# Patient Record
Sex: Female | Born: 1980
Health system: Southern US, Community
[De-identification: ages and names within clinical notes are randomized; demographics above are authoritative.]

## PROBLEM LIST (undated history)

## (undated) DIAGNOSIS — Z8709 Personal history of other diseases of the respiratory system: Secondary | ICD-10-CM

## (undated) DIAGNOSIS — F1910 Other psychoactive substance abuse, uncomplicated: Secondary | ICD-10-CM

## (undated) DIAGNOSIS — G4700 Insomnia, unspecified: Secondary | ICD-10-CM

## (undated) DIAGNOSIS — F419 Anxiety disorder, unspecified: Secondary | ICD-10-CM

## (undated) DIAGNOSIS — R51 Headache: Secondary | ICD-10-CM

## (undated) DIAGNOSIS — F329 Major depressive disorder, single episode, unspecified: Secondary | ICD-10-CM

## (undated) DIAGNOSIS — Z765 Malingerer [conscious simulation]: Secondary | ICD-10-CM

## (undated) DIAGNOSIS — Z87442 Personal history of urinary calculi: Secondary | ICD-10-CM

## (undated) DIAGNOSIS — M549 Dorsalgia, unspecified: Secondary | ICD-10-CM

## (undated) DIAGNOSIS — R102 Pelvic and perineal pain: Secondary | ICD-10-CM

## (undated) DIAGNOSIS — R351 Nocturia: Secondary | ICD-10-CM

## (undated) DIAGNOSIS — F32A Depression, unspecified: Secondary | ICD-10-CM

## (undated) DIAGNOSIS — R519 Headache, unspecified: Secondary | ICD-10-CM

## (undated) DIAGNOSIS — G8929 Other chronic pain: Secondary | ICD-10-CM

## (undated) DIAGNOSIS — J302 Other seasonal allergic rhinitis: Secondary | ICD-10-CM

## (undated) HISTORY — PX: TUBAL LIGATION: SHX77

## (undated) HISTORY — PX: CHOLECYSTECTOMY: SHX55

---
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
## 2000-01-27 ENCOUNTER — Inpatient Hospital Stay (HOSPITAL_COMMUNITY): Admission: AD | Admit: 2000-01-27 | Discharge: 2000-01-27 | Payer: Self-pay | Admitting: Obstetrics

## 2000-01-27 ENCOUNTER — Encounter: Payer: Self-pay | Admitting: Obstetrics

## 2000-02-24 ENCOUNTER — Other Ambulatory Visit: Admission: RE | Admit: 2000-02-24 | Discharge: 2000-02-24 | Payer: Self-pay | Admitting: Obstetrics and Gynecology

## 2000-07-15 ENCOUNTER — Ambulatory Visit (HOSPITAL_COMMUNITY): Admission: AD | Admit: 2000-07-15 | Discharge: 2000-07-15 | Payer: Self-pay | Admitting: Obstetrics and Gynecology

## 2000-07-26 ENCOUNTER — Observation Stay (HOSPITAL_COMMUNITY): Admission: AD | Admit: 2000-07-26 | Discharge: 2000-07-27 | Payer: Self-pay | Admitting: Obstetrics and Gynecology

## 2000-07-26 ENCOUNTER — Ambulatory Visit (HOSPITAL_COMMUNITY): Admission: RE | Admit: 2000-07-26 | Discharge: 2000-07-26 | Payer: Self-pay | Admitting: Obstetrics and Gynecology

## 2000-07-27 ENCOUNTER — Encounter: Payer: Self-pay | Admitting: Obstetrics and Gynecology

## 2000-09-13 ENCOUNTER — Inpatient Hospital Stay (HOSPITAL_COMMUNITY): Admission: AD | Admit: 2000-09-13 | Discharge: 2000-09-15 | Payer: Self-pay | Admitting: Obstetrics and Gynecology

## 2001-03-28 ENCOUNTER — Other Ambulatory Visit: Admission: RE | Admit: 2001-03-28 | Discharge: 2001-03-28 | Payer: Self-pay | Admitting: Obstetrics and Gynecology

## 2002-04-02 ENCOUNTER — Ambulatory Visit (HOSPITAL_COMMUNITY): Admission: RE | Admit: 2002-04-02 | Discharge: 2002-04-02 | Payer: Self-pay | Admitting: Obstetrics and Gynecology

## 2002-04-02 ENCOUNTER — Encounter: Payer: Self-pay | Admitting: Obstetrics and Gynecology

## 2002-05-23 ENCOUNTER — Encounter: Payer: Self-pay | Admitting: Obstetrics & Gynecology

## 2002-05-23 ENCOUNTER — Ambulatory Visit (HOSPITAL_COMMUNITY): Admission: RE | Admit: 2002-05-23 | Discharge: 2002-05-23 | Payer: Self-pay | Admitting: Obstetrics & Gynecology

## 2002-07-01 ENCOUNTER — Emergency Department (HOSPITAL_COMMUNITY): Admission: EM | Admit: 2002-07-01 | Discharge: 2002-07-01 | Payer: Self-pay | Admitting: Emergency Medicine

## 2002-07-01 ENCOUNTER — Encounter: Payer: Self-pay | Admitting: Emergency Medicine

## 2004-05-15 ENCOUNTER — Inpatient Hospital Stay (HOSPITAL_COMMUNITY): Admission: AD | Admit: 2004-05-15 | Discharge: 2004-05-15 | Payer: Self-pay | Admitting: Obstetrics and Gynecology

## 2004-05-30 ENCOUNTER — Emergency Department (HOSPITAL_COMMUNITY): Admission: EM | Admit: 2004-05-30 | Discharge: 2004-05-30 | Payer: Self-pay | Admitting: Emergency Medicine

## 2004-08-17 ENCOUNTER — Ambulatory Visit (HOSPITAL_COMMUNITY): Admission: RE | Admit: 2004-08-17 | Discharge: 2004-08-17 | Payer: Self-pay | Admitting: *Deleted

## 2004-10-12 ENCOUNTER — Ambulatory Visit (HOSPITAL_COMMUNITY): Admission: RE | Admit: 2004-10-12 | Discharge: 2004-10-12 | Payer: Self-pay | Admitting: *Deleted

## 2004-10-15 ENCOUNTER — Ambulatory Visit (HOSPITAL_COMMUNITY): Admission: AD | Admit: 2004-10-15 | Discharge: 2004-10-15 | Payer: Self-pay | Admitting: *Deleted

## 2004-10-15 ENCOUNTER — Emergency Department (HOSPITAL_COMMUNITY): Admission: EM | Admit: 2004-10-15 | Discharge: 2004-10-15 | Payer: Self-pay | Admitting: *Deleted

## 2004-10-22 ENCOUNTER — Ambulatory Visit (HOSPITAL_COMMUNITY): Admission: AD | Admit: 2004-10-22 | Discharge: 2004-10-22 | Payer: Self-pay | Admitting: *Deleted

## 2004-12-13 ENCOUNTER — Ambulatory Visit (HOSPITAL_COMMUNITY): Admission: AD | Admit: 2004-12-13 | Discharge: 2004-12-13 | Payer: Self-pay | Admitting: *Deleted

## 2004-12-15 ENCOUNTER — Ambulatory Visit (HOSPITAL_COMMUNITY): Admission: RE | Admit: 2004-12-15 | Discharge: 2004-12-15 | Payer: Self-pay | Admitting: *Deleted

## 2004-12-23 ENCOUNTER — Inpatient Hospital Stay (HOSPITAL_COMMUNITY): Admission: AD | Admit: 2004-12-23 | Discharge: 2004-12-26 | Payer: Self-pay | Admitting: *Deleted

## 2005-10-30 ENCOUNTER — Emergency Department (HOSPITAL_COMMUNITY): Admission: EM | Admit: 2005-10-30 | Discharge: 2005-10-30 | Payer: Self-pay | Admitting: Emergency Medicine

## 2006-02-27 ENCOUNTER — Emergency Department (HOSPITAL_COMMUNITY): Admission: EM | Admit: 2006-02-27 | Discharge: 2006-02-27 | Payer: Self-pay | Admitting: Emergency Medicine

## 2006-02-28 ENCOUNTER — Ambulatory Visit (HOSPITAL_COMMUNITY): Admission: RE | Admit: 2006-02-28 | Discharge: 2006-02-28 | Payer: Self-pay | Admitting: Emergency Medicine

## 2006-03-07 ENCOUNTER — Encounter (INDEPENDENT_AMBULATORY_CARE_PROVIDER_SITE_OTHER): Payer: Self-pay | Admitting: Specialist

## 2006-03-07 ENCOUNTER — Ambulatory Visit (HOSPITAL_COMMUNITY): Admission: RE | Admit: 2006-03-07 | Discharge: 2006-03-07 | Payer: Self-pay | Admitting: General Surgery

## 2006-09-25 ENCOUNTER — Emergency Department (HOSPITAL_COMMUNITY): Admission: EM | Admit: 2006-09-25 | Discharge: 2006-09-25 | Payer: Self-pay | Admitting: Emergency Medicine
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
## 2010-05-22 NOTE — Op Note (Signed)
[name], [name]              ACCOUNT NO.:  [id number]   MEDICAL RECORD NO.:  [id number]          PATIENT TYPE:  AMB   LOCATION:  DAY                           FACILITY:  [facility]   PHYSICIAN:  Dalia Heading, M.D.  DATE OF BIRTH:  01-14-1980   DATE OF PROCEDURE:  03/06/2006  DATE OF DISCHARGE:                               OPERATIVE REPORT   PREOPERATIVE DIAGNOSIS:  Cholecystitis, cholelithiasis.   POSTOPERATIVE DIAGNOSIS:  Cholecystitis, cholelithiasis.   PROCEDURE:  Laparoscopic cholecystectomy.   SURGEON:  Dalia Heading, MD   ANESTHESIA:  General endotracheal.   INDICATIONS:  The patient is a 31 year old white female who is referred  for evaluation and treatment of cholecystitis secondary to  cholelithiasis. The risks and benefits of the procedure including  bleeding, infection, hepatobiliary injury, and the possibility of an  open procedure were fully explained to the patient, who gave informed  consent.   PROCEDURE NOTE:  The patient was placed in the supine position.  After  induction of general endotracheal anesthesia, the abdomen was prepped  and draped using the usual sterile technique with Betadine.  Surgical  site confirmation was performed.   An infraumbilical incision was made down to the fascia.  Veress needle  was introduced into the abdominal cavity and confirmation of placement  was done using the saline drop test.  The abdomen was then insufflated  to 16 mmHg pressure.  An 11-mm trocar was introduced into the abdominal  cavity under direct visualization without difficulty.  The patient was  placed in reversed Trendelenburg position.  An additional 11-mm trocar  was placed epigastric region and 5-mm trocars were placed in the right  upper quadrant and right flank regions.  The liver was inspected and  noted to within normal limits.  The gallbladder was retracted superiorly  and laterally.  Hydrops of the gallbladder was noted.  This was  decompressed  using a needle.  The infundibulum was fully identified.  It  juncture to the infundibulum fully identified.  Endoclips were placed  proximally and distally on the cystic duct and the cystic duct was  divided.  This was likewise done to the cystic artery.  The gallbladder  then freed away from the gallbladder fossa using Bovie electrocautery.  The gallbladder was delivered through the epigastric trocar site using  EndoCatch bag.  The gallbladder fossa was inspected and no abnormal  bleeding or bile leakage was noted.  Surgicel was placed in the  gallbladder fossa.  All fluid and air were then evacuated from the  abdominal cavity prior to the removal of the trocars.   All wounds were irrigated with normal saline.  All wounds were checked  with 0.5 % Sensorcaine.  The infraumbilical fascia as well as epigastric  fascia were reapproximated using 0 Vicryl interrupted sutures.  All skin  incisions were closed using staples.  Betadine ointment and dry sterile  dressings were applied.   All tape and needle counts were correct at the end of the procedure.  The patient was extubated in the operating room went back to the  recovery room awake,  in stable condition.   COMPLICATIONS:  None.   SPECIMEN:  Gallbladder.   BLOOD LOSS:  Minimal.      Dalia Heading, M.D.  Electronically Signed     MAJ/MEDQ  D:  03/07/2006  T:  03/07/2006  Job:  [id number]

## 2010-05-22 NOTE — Discharge Summary (Signed)
NAME:  [name], [name]             ACCOUNT NO.:  [id number]   MEDICAL RECORD NO.:  [id number]          PATIENT TYPE:  INP   LOCATION:  A412                          FACILITY:  [facility]   PHYSICIAN:  Langley Gauss, MD     DATE OF BIRTH:  1980/08/14   DATE OF ADMISSION:  12/23/2004  DATE OF DISCHARGE:  12/23/2006LH                                 DISCHARGE SUMMARY   DISCHARGE DIAGNOSES:  A 37-week intrauterine pregnancy, in labor.   PROCEDURES:  1.  On December 24, 2004, epidural.  2.  On December 24, 2004, vaginal delivery.  3.  On December 26, 2004, discharged to home.   PERTINENT LABORATORY DATA:  GBS carrier status positive.  The patient was  treated during the course of labor with IV ampicillin.  In addition,  infant's circumcision performed December 26, 2004.  RPR negative, B positive  blood type.  Admission hemoglobin and hematocrit 12.3/35.7, white count  12.9.  Postpartum day # 1 11.7/33.8 with white count of 13.2.   DISCHARGE MEDICATIONS:  Tylox.   HOSPITAL COURSE:  See previous dictations.  The patient, actually, was  admitted in early stages of active preterm labor on December 23, 2004, at 43-  1/[redacted] weeks gestation.  After ambulating, she was noted to be 5 cm dilatation.  Amniotomy was thus performed on December 24, 2004.  Shortly thereafter,  epidural was placed.  She progressed very rapidly to well controlled  atraumatic vaginal delivery.  Postpartum, the patient has had no postpartum  complications.  Bonding well with the infant.  She is, however, interested  in permanent sterilization.  Thus, she is advised to follow up in the office  in three weeks time at which time this can be scheduled 5-6 weeks  postpartum.      Langley Gauss, MD  Electronically Signed     DC/MEDQ  D:  12/26/2004  T:  12/26/2004  Job:  [id number]
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
## 2010-05-22 NOTE — Op Note (Signed)
NAME:  [name], [name]             ACCOUNT NO.:  [id number]   MEDICAL RECORD NO.:  [id number]          PATIENT TYPE:  INP   LOCATION:  A412                          FACILITY:  [facility]   PHYSICIAN:  Langley Gauss, MD     DATE OF BIRTH:  1980-07-04   DATE OF PROCEDURE:  12/24/2004  DATE OF DISCHARGE:                                 OPERATIVE REPORT   PROCEDURE:  Placement of epidural L3-L4 interspace. Start time 0500.  Appropriate informed consent is obtained. The patient is noted be 8 cm  dilated, 0 station, completely effaced. She has received IV narcotics which  had been ineffectual. Thus, she is placed in a seated position. Bony  landmarks were identified. L3-L4 interspace was chosen. The patient's back  is sterilely prepped and draped. Utilizing an epidural tray, 5 cc 1%  lidocaine plain injected at the midline in the interspace. A 17-gauge Tuohy  Schiff needle was utilized with loss resistance air-filled glass syringe to  atraumatically identify entry into the epidural space on the first attempt  without difficulty. Very easy entry of the epidural space was performed  without any difficulty. Thus, test dosages of 5, 5 cc 2% lidocaine injected  through the epidural needle. No signs of CSF or intravascular injection  obtained. Epidural catheter was inserted to a depth of 5 cm. Epidural needle  was removed. Epidural catheter secured into place. The patient is now  connected to the infusion pump containing a standard mixture and will be  treated with a continuous infusion rate of 14 cc/hour. The patient per  nursing staff is noted to be 8 cm dilated.      Langley Gauss, MD  Electronically Signed     DC/MEDQ  D:  12/24/2004  T:  12/25/2004  Job:  [id number]

## 2010-05-22 NOTE — Op Note (Signed)
NAME:  [name], [name]             ACCOUNT NO.:  [id number]   MEDICAL RECORD NO.:  [id number]          PATIENT TYPE:  INP   LOCATION:                                FACILITY:  [facility]   PHYSICIAN:  Langley Gauss, MD     DATE OF BIRTH:  08-30-1980   DATE OF PROCEDURE:  12/24/2004  DATE OF DISCHARGE:  12/26/2004                                 OPERATIVE REPORT   PROCEDURES:  Delivery performed is spontaneous assisted vaginal delivery of  a viable vigorous female infant delivered over an intact peroneum.  Delivery  performed by Dr. Langley Gauss.  Estimated blood loss less then 500 cc.   COMPLICATIONS:  None.   SPECIMENS:  Cord gas and cord blood to laboratory.  Placenta was examined  and noted to be apparently intact with a three vessel umbilical cord.   ANALGESIA:  The patient had received IV Nubain and Phenergan during the  course of the labor, but also had epidural placed.   SUMMARY:  After amniotomy and findings of clear amniotic fluid, the patient  progressed very rapidly along the labor curve to complete dilatation after  which time I was notified on an urgent basis.  I was here on the floor.  The  patient was placed in the low lithotomy position, pushed well during short  second stages of labor.  Excellent peroneal support was provided.  Infant  did deliver in an direct OA position over an intact peroneum.  Also, bulb  section to clear amniotic fluids, spontaneous rotation occurred to a left  anterior shoulder position.  Gentle abdominal traction combined with  expulsive efforts resulted in delivery of the shoulder, as well as, the  remainder of the infant without difficulty.  Umbilical cord is __________and  cord is double clamped and cut.  Infant was placed on maternal abdomen for  many bonding purposes.  Cord gas and cord blood are obtained.  Gentle  traction on the umbilical cord resulted in separation at which upon exam  appears to be an intact placenta with three vessel  cord.  Excellent uterine  tone is achieved.  Examination of genital tract reveals no lacerations.  Peroneum is intact.  Thus, the patient is taken out of lithotomy position  and rolled to her side at which time the epidural catheter is removed with  the blue tip noted to be intact.      Langley Gauss, MD  Electronically Signed     DC/MEDQ  D:  12/26/2004  T:  12/26/2004  Job:  [id number]
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
## 2010-05-22 NOTE — H&P (Signed)
NAME:  [name], [name]             ACCOUNT NO.:  [id number]   MEDICAL RECORD NO.:  [id number]          PATIENT TYPE:  INP   LOCATION:  A412                          FACILITY:  [facility]   PHYSICIAN:  Langley Gauss, MD     DATE OF BIRTH:  1980/11/16   DATE OF ADMISSION:  12/23/2004  DATE OF DISCHARGE:  LH                                HISTORY & PHYSICAL   I  dictated H&P 1 week previously at which time the patient was felt to be  in active preterm labor. She was discharged home at that time undelivered.  This dictation will not be redone, rather change in the patient's vital  status as now she is 1 week further along. She is seen in the office at  which time she was noted to be 4 cm dilated, 80% effaced, and zero station.  She is referred to [facility] at which time I knew that she was  going to progress into labor. However, she was having uterine irritability  and occasional uterine contractions. She is noted to be a multiparous  patient with history of rapid labor and delivery. Thus, it was mandatory to  satisfy nursing staff that the patient be observed for an extended period of  time until contractions became every 5-7 minutes and cervical change has  occurred to 5 cm, 5 cm dilated, 80% effaced, and in zero station. She was  admitted. Amniotomy performed.  Clear amniotic fluid was noted. The fetal  scalp electrode was placed which documents reassuring fetal heart rate. She  is known to be positive GBS carrier status; thus, she is receiving IV  ampicillin during the course of labor.      Langley Gauss, MD  Electronically Signed     DC/MEDQ  D:  12/24/2004  T:  12/24/2004  Job:  [id number]

## 2010-05-22 NOTE — H&P (Signed)
NAMEMAYSUN, MEDITZ              ACCOUNT NO.:  [id number]   MEDICAL RECORD NO.:  [id number]          PATIENT TYPE:  AMB   LOCATION:  DAY                           FACILITY:  [facility]   PHYSICIAN:  Dalia Heading, M.D.  DATE OF BIRTH:  1980-05-27   DATE OF ADMISSION:  DATE OF DISCHARGE:  LH                              HISTORY & PHYSICAL   CHIEF COMPLAINT:  Cholecystitis, cholelithiasis.   HISTORY OF PRESENT ILLNESS:  The patient is a 30 year old white female  who is referred for evaluation and treatment of biliary colic secondary  to cholelithiasis.  She has been having right upper quadrant abdominal  pain, nausea, and bloating for many months.  She does have fatty food  intolerance.  No fever, chills, jaundice have been noted.   PAST MEDICAL HISTORY:  Unremarkable.   PAST SURGICAL HISTORY:  Unremarkable.   CURRENT MEDICATIONS:  Vicodin for pain.   ALLERGIES:  NO KNOWN DRUG ALLERGIES.   REVIEW OF SYSTEMS:  The patient smokes less than half a pack of  cigarettes a day.  She denies any significant alcohol use.   PHYSICAL EXAMINATION:  The patient is a well-developed, well-nourished  white female in no acute distress.  HEENT:  Reveals no scleral icterus.  LUNGS:  Clear to auscultation with equal breath sounds bilaterally.  HEART:  Reveals regular rate and rhythm without S3, S4, murmurs.  ABDOMEN:  Soft and nondistended.  She is tender in the right upper  quadrant to palpation.  No hepatosplenomegaly, masses, hernias are  identified.  Ultrasound of the gallbladder reveals cholelithiasis with a  normal common bile duct.   IMPRESSION:  Cholecystitis, cholelithiasis.   PLAN:  The patient is scheduled for laparoscopic cholecystectomy on  03/07/2006.  The risks and benefits of the procedure including bleeding,  infection, hepatobiliary injury, and the possibility of an open  procedure were fully explained to the patient, gave informed consent.      Dalia Heading, M.D.  Electronically Signed     MAJ/MEDQ  D:  03/01/2006  T:  03/01/2006  Job:  [id number]   cc:   Dalia Heading, M.D.  Fax: [phone]

## 2011-01-19 ENCOUNTER — Encounter (HOSPITAL_COMMUNITY): Payer: Self-pay | Admitting: *Deleted

## 2011-01-19 ENCOUNTER — Emergency Department (HOSPITAL_COMMUNITY)
Admission: EM | Admit: 2011-01-19 | Discharge: 2011-01-20 | Disposition: A | Discharge status: Home / Self Care | Payer: Self-pay | Attending: Emergency Medicine | Admitting: Emergency Medicine

## 2011-01-19 DIAGNOSIS — Z9889 Other specified postprocedural states: Secondary | ICD-10-CM | POA: Insufficient documentation

## 2011-01-19 DIAGNOSIS — Z9851 Tubal ligation status: Secondary | ICD-10-CM | POA: Insufficient documentation

## 2011-01-19 DIAGNOSIS — R1032 Left lower quadrant pain: Secondary | ICD-10-CM | POA: Insufficient documentation

## 2011-01-19 DIAGNOSIS — F17200 Nicotine dependence, unspecified, uncomplicated: Secondary | ICD-10-CM | POA: Insufficient documentation

## 2011-01-19 DIAGNOSIS — N949 Unspecified condition associated with female genital organs and menstrual cycle: Secondary | ICD-10-CM | POA: Insufficient documentation

## 2011-01-19 DIAGNOSIS — R102 Pelvic and perineal pain: Secondary | ICD-10-CM

## 2011-01-19 DIAGNOSIS — R52 Pain, unspecified: Secondary | ICD-10-CM | POA: Insufficient documentation

## 2011-01-19 LAB — POCT PREGNANCY, URINE: Preg Test, Ur: NEGATIVE

## 2011-01-19 NOTE — ED Notes (Signed)
C/o abd pain to lower from left side to right for 3 days, + nausea, denies vomiting or diarrhea, denies vag discharge, LBM today and normal per pt.

## 2011-01-19 NOTE — ED Notes (Signed)
Pt reporting pain in lower left portion of abdomen, radiating across to lower right.  Reports some nausea, denies vomiting. Pt reports history of ovarian cysts and reports pain is similar.

## 2011-01-20 ENCOUNTER — Other Ambulatory Visit (HOSPITAL_COMMUNITY): Payer: Self-pay | Admitting: Emergency Medicine

## 2011-01-20 ENCOUNTER — Ambulatory Visit (HOSPITAL_COMMUNITY)
Admit: 2011-01-20 | Discharge: 2011-01-20 | Disposition: A | Discharge status: Home / Self Care | Payer: Self-pay | Source: Ambulatory Visit | Attending: Emergency Medicine | Admitting: Emergency Medicine

## 2011-01-20 DIAGNOSIS — R1032 Left lower quadrant pain: Secondary | ICD-10-CM | POA: Insufficient documentation

## 2011-01-20 DIAGNOSIS — R52 Pain, unspecified: Secondary | ICD-10-CM

## 2011-01-20 DIAGNOSIS — N949 Unspecified condition associated with female genital organs and menstrual cycle: Secondary | ICD-10-CM | POA: Insufficient documentation

## 2011-01-20 DIAGNOSIS — R9389 Abnormal findings on diagnostic imaging of other specified body structures: Secondary | ICD-10-CM | POA: Insufficient documentation

## 2011-01-20 LAB — DIFFERENTIAL
Eosinophils Relative: 3 % (ref 0–5)
Lymphs Abs: 3.6 10*3/uL (ref 0.7–4.0)
Monocytes Absolute: 0.7 10*3/uL (ref 0.1–1.0)
Monocytes Relative: 7 % (ref 3–12)
Neutro Abs: 4.7 10*3/uL (ref 1.7–7.7)
Neutrophils Relative %: 50 % (ref 43–77)

## 2011-01-20 LAB — URINALYSIS, ROUTINE W REFLEX MICROSCOPIC
Ketones, ur: NEGATIVE mg/dL
Leukocytes, UA: NEGATIVE
Urobilinogen, UA: 1 mg/dL (ref 0.0–1.0)

## 2011-01-20 LAB — CBC
Hemoglobin: 13.6 g/dL (ref 12.0–15.0)
Platelets: 255 10*3/uL (ref 150–400)
RBC: 4.32 MIL/uL (ref 3.87–5.11)

## 2011-01-20 LAB — BASIC METABOLIC PANEL
BUN: 12 mg/dL (ref 6–23)
CO2: 25 mEq/L (ref 19–32)
Creatinine, Ser: 0.69 mg/dL (ref 0.50–1.10)
GFR calc Af Amer: >90 mL/min (ref >90)
Glucose, Bld: 82 mg/dL (ref 70–99)
Sodium: 137 mEq/L (ref 135–145)

## 2011-01-20 LAB — WET PREP, GENITAL

## 2011-01-20 LAB — RPR: RPR Ser Ql: NONREACTIVE

## 2011-01-20 MED ORDER — OXYCODONE-ACETAMINOPHEN 5-325 MG PO TABS
1.0000 | ORAL_TABLET | Freq: Once | ORAL | Status: AC
  Administered 2011-01-20: 1 via ORAL
  Filled 2011-01-20: qty 1

## 2011-01-20 MED ORDER — OXYCODONE-ACETAMINOPHEN 5-325 MG PO TABS: 1.0000 | ORAL_TABLET | ORAL | Status: AC | PRN

## 2011-01-20 MED ORDER — ONDANSETRON 8 MG PO TBDP
8.0000 mg | ORAL_TABLET | Freq: Once | ORAL | Status: AC
  Administered 2011-01-20: 8 mg via ORAL
  Filled 2011-01-20: qty 1

## 2011-01-20 MED ORDER — KETOROLAC TROMETHAMINE 60 MG/2ML IM SOLN
60.0000 mg | Freq: Once | INTRAMUSCULAR | Status: AC
  Administered 2011-01-20: 60 mg via INTRAMUSCULAR
  Filled 2011-01-20: qty 2

## 2011-01-20 NOTE — ED Notes (Addendum)
Pelvic Ultrasound scheduled for Wed at 10am needs to be here at 9:45 with a full bladder. Dont use the restroom until instructed by radiology.

## 2011-01-20 NOTE — ED Provider Notes (Signed)
History     CSN: [id number]  Arrival date & time 01/19/11  2244   First MD Initiated Contact with Patient 01/19/11 2326      Chief Complaint  Patient presents with  . Abdominal Pain  . Nausea    (Consider location/radiation/quality/duration/timing/severity/associated sxs/prior treatment) Patient is a 31 y.o. female presenting with abdominal pain. The history is provided by the patient.  Abdominal Pain The primary symptoms of the illness include abdominal pain. The primary symptoms of the illness do not include fever, shortness of breath, nausea, vaginal discharge or vaginal bleeding. Episode onset: 3 days ago. The onset of the illness was gradual. The problem has been gradually worsening.  The abdominal pain is located in the LLQ. The abdominal pain radiates to the RLQ. The severity of the abdominal pain is 10/10. The abdominal pain is relieved by nothing (Pain is not worsened with by mouth intake. She denies vomiting but has had nausea. No constipation or diarrhea, last bowel movement was this morning, normal and did not affect pain.). Exacerbated by: Pain is worsened with movement such as walking and with palpation.  Associated with: She denies fevers. The patient states that she believes she is currently not pregnant. The patient has not had a change in bowel habit. Symptoms associated with the illness do not include chills, anorexia, urgency or frequency. Associated symptoms comments: She denies vaginal discharge. She reports she does have a history of ovarian cyst, but her pain is more severe in previously, but the location is similar to previous episodes..    No past medical history on file.  Past Surgical History  Procedure Date  . Cholecystectomy   . Tubal ligation     No family history on file.  History  Substance Use Topics  . Smoking status: Current Everyday Smoker -- 0.5 packs/day    Types: Cigarettes  . Smokeless tobacco: Not on file  . Alcohol Use: No    OB  History    Grav Para Term Preterm Abortions TAB SAB Ect Mult Living                  Review of Systems  Constitutional: Negative for fever and chills.  HENT: Negative for congestion, sore throat and neck pain.   Eyes: Negative.   Respiratory: Negative for chest tightness and shortness of breath.   Cardiovascular: Negative for chest pain.  Gastrointestinal: Positive for abdominal pain. Negative for nausea and anorexia.  Genitourinary: Negative.  Negative for urgency, frequency, vaginal bleeding and vaginal discharge.       She does report heavy periods, but regular. Her last menses was 2 weeks ago.  Musculoskeletal: Negative for joint swelling and arthralgias.  Skin: Negative.  Negative for rash and wound.  Neurological: Negative for dizziness, weakness, light-headedness, numbness and headaches.  Hematological: Negative.   Psychiatric/Behavioral: Negative.     Allergies  Aspirin  Home Medications   Current Outpatient Rx  Name Route Sig Dispense Refill  . ACETAMINOPHEN 500 MG PO TABS Oral Take 1,000 mg by mouth once as needed. For pain    . IBUPROFEN 200 MG PO TABS Oral Take 800 mg by mouth once as needed. For pain    . OXYCODONE-ACETAMINOPHEN 5-325 MG PO TABS Oral Take 1 tablet by mouth every 4 (four) hours as needed for pain. 15 tablet 0    BP 119/89  Pulse 72  Temp(Src) 98.2 F (36.8 C) (Oral)  Resp 16  Ht 5\' 2"  (1.575 m)  Wt 135 lb (61.236  kg)  BMI 24.69 kg/m2  SpO2 100%  LMP 12/29/2010  Physical Exam  Nursing note and vitals reviewed. Constitutional: She is oriented to person, place, and time. She appears well-developed and well-nourished.  HENT:  Head: Normocephalic and atraumatic.  Eyes: Conjunctivae are normal.  Neck: Normal range of motion.  Cardiovascular: Normal rate, regular rhythm, normal heart sounds and intact distal pulses.   Pulmonary/Chest: Effort normal and breath sounds normal. She has no wheezes.  Abdominal: Soft. Bowel sounds are normal. There  is no tenderness.  Genitourinary: Vagina normal and uterus normal. Cervix exhibits no motion tenderness, no discharge and no friability. Right adnexum displays tenderness. Right adnexum displays no fullness. Left adnexum displays tenderness and fullness. No vaginal discharge found.  Musculoskeletal: Normal range of motion.  Neurological: She is alert and oriented to person, place, and time.  Skin: Skin is warm and dry.  Psychiatric: She has a normal mood and affect.    ED Course  Procedures (including critical care time)      1. Pelvic pain in female   2. Pain       MDM  Patient patient disposition by Dr. Read Drivers pending wet prep result.  Scheduled for pelvic US in am.        Candis Musa, PA 01/20/11 1112

## 2011-01-20 NOTE — Progress Notes (Signed)
1118 Reviewed [place] results with the patient. She was given referral to [facility].   US Transvaginal Non-ob  01/20/2011  *RADIOLOGY REPORT*  Clinical Data: Pelvic pain.Left-sided.  TRANSABDOMINAL AND TRANSVAGINAL ULTRASOUND OF PELVIS Technique:  Both transabdominal and transvaginal ultrasound examinations of the pelvis were performed. Transabdominal technique was performed for global imaging of the pelvis including uterus, ovaries, adnexal regions, and pelvic cul-de-sac.  Comparison: Abdominal pelvic CT of 02/27/2006.  Numerous prior obstetric ultrasounds.   It was necessary to proceed with endovaginal exam following the transabdominal exam to visualize the uterus, ovaries, and adnexa  .  Findings:  Uterus: 8.5 x 4.6 x 4.9 cm Normal in morphology.  Endometrium: 11 mm.  No focal abnormality.  Right ovary:  1.7 x 1.7 x 2.6 cm. Normal in morphology.  Left ovary: 3.9 x 2.9 x 4.6 cm.  A 2.7 x 2.1 x 2.9 cm lesion demonstrates low level internal echoes and enhanced through transmission.  Avascular.  Other findings: Small volume, possibly physiologic.  IMPRESSION: 1. 2.9 cm left ovarian lesion is most likely a hemorrhagic cyst. Endometrioma could look similar, felt less likely.  Recommend follow-up with ultrasound at 6 - 12 weeks. 2.  Trace cul-de-sac fluid, likely physiologic.  Original Report Authenticated By: Consuello Bossier, M.D.   US Pelvis Complete  01/20/2011  *RADIOLOGY REPORT*  Clinical Data: Pelvic pain.Left-sided.  TRANSABDOMINAL AND TRANSVAGINAL ULTRASOUND OF PELVIS Technique:  Both transabdominal and transvaginal ultrasound examinations of the pelvis were performed. Transabdominal technique was performed for global imaging of the pelvis including uterus, ovaries, adnexal regions, and pelvic cul-de-sac.  Comparison: Abdominal pelvic CT of 02/27/2006.  Numerous prior obstetric ultrasounds.   It was necessary to proceed with endovaginal exam following the transabdominal exam to visualize the uterus, ovaries,  and adnexa  .  Findings:  Uterus: 8.5 x 4.6 x 4.9 cm Normal in morphology.  Endometrium: 11 mm.  No focal abnormality.  Right ovary:  1.7 x 1.7 x 2.6 cm. Normal in morphology.  Left ovary: 3.9 x 2.9 x 4.6 cm.  A 2.7 x 2.1 x 2.9 cm lesion demonstrates low level internal echoes and enhanced through transmission.  Avascular.  Other findings: Small volume, possibly physiologic.  IMPRESSION: 1. 2.9 cm left ovarian lesion is most likely a hemorrhagic cyst. Endometrioma could look similar, felt less likely.  Recommend follow-up with ultrasound at 6 - 12 weeks. 2.  Trace cul-de-sac fluid, likely physiologic.  Original Report Authenticated By: Consuello Bossier, M.D.

## 2011-01-21 LAB — GC/CHLAMYDIA PROBE AMP, GENITAL: GC Probe Amp, Genital: NEGATIVE

## 2011-01-22 NOTE — ED Provider Notes (Signed)
Medical screening examination/treatment/procedure(s) were performed by non-physician practitioner and as supervising physician I was immediately available for consultation/collaboration.   Hanley Seamen, MD 01/22/11 2242

## 2011-03-31 ENCOUNTER — Emergency Department (HOSPITAL_COMMUNITY)
Admission: EM | Admit: 2011-03-31 | Discharge: 2011-03-31 | Disposition: A | Discharge status: Home / Self Care | Payer: Self-pay | Attending: Emergency Medicine | Admitting: Emergency Medicine

## 2011-03-31 ENCOUNTER — Encounter (HOSPITAL_COMMUNITY): Payer: Self-pay

## 2011-03-31 DIAGNOSIS — G8929 Other chronic pain: Secondary | ICD-10-CM | POA: Insufficient documentation

## 2011-03-31 DIAGNOSIS — M545 Low back pain, unspecified: Secondary | ICD-10-CM | POA: Insufficient documentation

## 2011-03-31 DIAGNOSIS — F17200 Nicotine dependence, unspecified, uncomplicated: Secondary | ICD-10-CM | POA: Insufficient documentation

## 2011-03-31 DIAGNOSIS — R51 Headache: Secondary | ICD-10-CM | POA: Insufficient documentation

## 2011-03-31 MED ORDER — DEXAMETHASONE 4 MG PO TABS: ORAL_TABLET | ORAL | Status: AC

## 2011-03-31 MED ORDER — HYDROCODONE-ACETAMINOPHEN 5-325 MG PO TABS: 1.0000 | ORAL_TABLET | ORAL | Status: AC | PRN

## 2011-03-31 MED ORDER — DEXAMETHASONE SODIUM PHOSPHATE 4 MG/ML IJ SOLN
8.0000 mg | Freq: Once | INTRAMUSCULAR | Status: AC
  Administered 2011-03-31: 8 mg via INTRAMUSCULAR
  Filled 2011-03-31: qty 2

## 2011-03-31 MED ORDER — METHOCARBAMOL 500 MG PO TABS: ORAL_TABLET | ORAL | Status: DC

## 2011-03-31 MED ORDER — ONDANSETRON HCL 4 MG PO TABS
4.0000 mg | ORAL_TABLET | Freq: Once | ORAL | Status: AC
  Administered 2011-03-31: 4 mg via ORAL
  Filled 2011-03-31: qty 1

## 2011-03-31 MED ORDER — HYDROCODONE-ACETAMINOPHEN 5-325 MG PO TABS
2.0000 | ORAL_TABLET | Freq: Once | ORAL | Status: AC
  Administered 2011-03-31: 2 via ORAL
  Filled 2011-03-31: qty 2

## 2011-03-31 MED ORDER — METHOCARBAMOL 500 MG PO TABS
1000.0000 mg | ORAL_TABLET | Freq: Once | ORAL | Status: AC
  Administered 2011-03-31: 1000 mg via ORAL
  Filled 2011-03-31: qty 2

## 2011-03-31 NOTE — Discharge Instructions (Signed)

## 2011-03-31 NOTE — ED Notes (Signed)
Pt reports has had pain in neck and back for "years" but says pain has been getting worse over the past 2 weeks and got severe over the past 2 day.  Denies injury or heavy lifting/pulling.  Also c/o headache in R side of head since this am.  Reports vomited yesterday and nausea today.  Pt says has history of headaches but states this one is different.  States that   usually whole head hurts but this headache in only in the r side of her head.

## 2011-03-31 NOTE — ED Provider Notes (Signed)
History     CSN: [id number]  Arrival date & time 03/31/11  4098   First MD Initiated Contact with Patient 03/31/11 [phone]      Chief Complaint  Patient presents with  . Back Pain  . Headache    (Consider location/radiation/quality/duration/timing/severity/associated sxs/prior treatment) Patient is a 31 y.o. female presenting with back pain and headaches. The history is provided by the patient.  Back Pain  This is a chronic problem. The problem occurs daily. The problem has not changed since onset.The pain is associated with no known injury. The pain is present in the lumbar spine. The pain is severe. The symptoms are aggravated by certain positions. Stiffness is present all day. Associated symptoms include headaches. Pertinent negatives include no chest pain, no fever, no abdominal pain, no bowel incontinence, no perianal numbness, no bladder incontinence and no dysuria. She has tried NSAIDs for the symptoms.  Headache  Pertinent negatives include no fever, no palpitations and no shortness of breath.    History reviewed. No pertinent past medical history.  Past Surgical History  Procedure Date  . Cholecystectomy   . Tubal ligation     No family history on file.  History  Substance Use Topics  . Smoking status: Current Everyday Smoker -- 0.5 packs/day    Types: Cigarettes  . Smokeless tobacco: Not on file  . Alcohol Use: No    OB History    Grav Para Term Preterm Abortions TAB SAB Ect Mult Living                  Review of Systems  Constitutional: Negative for fever and activity change.       All ROS Neg except as noted in HPI  HENT: Negative for nosebleeds and neck pain.   Eyes: Negative for photophobia and discharge.  Respiratory: Negative for cough, shortness of breath and wheezing.   Cardiovascular: Negative for chest pain and palpitations.  Gastrointestinal: Negative for abdominal pain, blood in stool and bowel incontinence.  Genitourinary: Negative for  bladder incontinence, dysuria, frequency and hematuria.  Musculoskeletal: Positive for back pain. Negative for arthralgias.  Skin: Negative.   Neurological: Positive for headaches. Negative for dizziness, seizures and speech difficulty.  Psychiatric/Behavioral: Negative for hallucinations and confusion.    Allergies  Aspirin  Home Medications   Current Outpatient Rx  Name Route Sig Dispense Refill  . ACETAMINOPHEN 500 MG PO TABS Oral Take 1,000 mg by mouth once as needed. For pain    . IBUPROFEN 200 MG PO TABS Oral Take 800 mg by mouth once as needed. For pain      BP 118/96  Pulse 84  Temp(Src) 98.4 F (36.9 C) (Oral)  Resp 16  Ht 5\' 3"  (1.6 m)  Wt 140 lb (63.504 kg)  BMI 24.80 kg/m2  SpO2 98%  LMP 03/26/2011  Physical Exam  Nursing note and vitals reviewed. Constitutional: She is oriented to person, place, and time. She appears well-developed and well-nourished.  Non-toxic appearance.  HENT:  Head: Normocephalic.  Right Ear: Tympanic membrane and external ear normal.  Left Ear: Tympanic membrane and external ear normal.  Eyes: EOM and lids are normal. Pupils are equal, round, and reactive to light.  Neck: Normal range of motion. Neck supple. Carotid bruit is not present.  Cardiovascular: Normal rate, regular rhythm, normal heart sounds, intact distal pulses and normal pulses.   Pulmonary/Chest: Breath sounds normal. No respiratory distress.  Abdominal: Soft. Bowel sounds are normal. There is no tenderness. There is  no guarding.  Musculoskeletal: Normal range of motion.       Muscles of the trapezius area are tight and tense bilaterally, right more than left.  There is pain to the point tenderness of the lower lumbar area extending to the top of the buttocks area. No hot areas appreciated. No palpable deformity appreciated.  Lymphadenopathy:       Head (right side): No submandibular adenopathy present.       Head (left side): No submandibular adenopathy present.     She has no cervical adenopathy.  Neurological: She is alert and oriented to person, place, and time. She has normal strength. No cranial nerve deficit or sensory deficit. She exhibits normal muscle tone. Coordination normal.  Skin: Skin is warm and dry.  Psychiatric: She has a normal mood and affect. Her speech is normal.    ED Course  Procedures (including critical care time)  Labs Reviewed - No data to display No results found.   No diagnosis found.    MDM  I have reviewed nursing notes, vital signs, and all appropriate lab and imaging results for this patient. Patient has history of chronic neck and back pain in the last 2 days this pain has been getting worse. Patient denies any recent injury or lifting pushing or pulling excessively. No gross neurologic deficits appreciated on examination today. Patient is treated with Robaxin 500 mg, Norco 5 mg, and Decadron 4 mg. Patient strongly advised to see orthopedics for Cipro thoroughly evaluation of her ongoing low back related and neck related pain problem.       Kathie Dike, [place] 03/31/11 1018

## 2011-04-01 NOTE — ED Provider Notes (Signed)
Medical screening examination/treatment/procedure(s) were performed by non-physician practitioner and as supervising physician I was immediately available for consultation/collaboration.  Donnetta Hutching, MD 04/01/11 [phone]

## 2011-05-07 ENCOUNTER — Emergency Department (HOSPITAL_COMMUNITY): Payer: Self-pay

## 2011-05-07 ENCOUNTER — Emergency Department (HOSPITAL_COMMUNITY)
Admission: EM | Admit: 2011-05-07 | Discharge: 2011-05-07 | Disposition: A | Discharge status: Home / Self Care | Payer: Self-pay | Attending: Emergency Medicine | Admitting: Emergency Medicine

## 2011-05-07 ENCOUNTER — Encounter (HOSPITAL_COMMUNITY): Payer: Self-pay | Admitting: *Deleted

## 2011-05-07 ENCOUNTER — Other Ambulatory Visit: Payer: Self-pay

## 2011-05-07 DIAGNOSIS — Z79899 Other long term (current) drug therapy: Secondary | ICD-10-CM | POA: Insufficient documentation

## 2011-05-07 DIAGNOSIS — R0602 Shortness of breath: Secondary | ICD-10-CM | POA: Insufficient documentation

## 2011-05-07 DIAGNOSIS — F411 Generalized anxiety disorder: Secondary | ICD-10-CM | POA: Insufficient documentation

## 2011-05-07 DIAGNOSIS — R071 Chest pain on breathing: Secondary | ICD-10-CM | POA: Insufficient documentation

## 2011-05-07 DIAGNOSIS — R0789 Other chest pain: Secondary | ICD-10-CM

## 2011-05-07 DIAGNOSIS — F17200 Nicotine dependence, unspecified, uncomplicated: Secondary | ICD-10-CM | POA: Insufficient documentation

## 2011-05-07 LAB — POCT I-STAT, CHEM 8
Chloride: 105 mEq/L (ref 96–112)
Glucose, Bld: 88 mg/dL (ref 70–99)
HCT: 46 % (ref 36.0–46.0)
Potassium: 3.6 mEq/L (ref 3.5–5.1)
Sodium: 141 mEq/L (ref 135–145)

## 2011-05-07 MED ORDER — KETOROLAC TROMETHAMINE 60 MG/2ML IM SOLN
60.0000 mg | Freq: Once | INTRAMUSCULAR | Status: AC
  Administered 2011-05-07: 60 mg via INTRAMUSCULAR
  Filled 2011-05-07: qty 2

## 2011-05-07 MED ORDER — IBUPROFEN 800 MG PO TABS: 800.0000 mg | ORAL_TABLET | Freq: Three times a day (TID) | ORAL | Status: AC

## 2011-05-07 NOTE — ED Provider Notes (Signed)
This chart was scribed for Glynn Octave, MD by Wallis Mart. The patient was seen in room [facility]19[facility] and the patient's care was started at 5:12 PM.   CSN: [id number]  Arrival date & time 05/07/11  1658   First MD Initiated Contact with Patient 05/07/11 1709      Chief Complaint  Patient presents with  . Shortness of Breath    (Consider location/radiation/quality/duration/timing/severity/associated sxs/prior treatment) HPI [name] is a 31 y.o. female who presents to the Emergency Department complaining of  sudden onset, persistence of constant, gradually worsening, moderate cp that radiates nowhere. Pt c/o associated sob, states that it hurts to take a deep breath or cough and that it feels like there is a knot in her chest, but she denies any injury to chest.  Denies h/o asthma.   Pt c/o chronic mild back pain. Denies loss of appetites, dysuria.  Pt took tylenol yesterday w/ no relief.  There are no other associated symptoms and no other alleviating or aggravating factors.  History reviewed. No pertinent past medical history.  Past Surgical History  Procedure Date  . Cholecystectomy   . Tubal ligation     History reviewed. No pertinent family history.  History  Substance Use Topics  . Smoking status: Current Everyday Smoker -- 0.5 packs/day    Types: Cigarettes  . Smokeless tobacco: Not on file  . Alcohol Use: No    OB History    Grav Para Term Preterm Abortions TAB SAB Ect Mult Living                  Review of Systems  10 Systems reviewed and all are negative for acute change except as noted in the HPI.   Allergies  Aspirin  Home Medications   Current Outpatient Rx  Name Route Sig Dispense Refill  . ACETAMINOPHEN 500 MG PO TABS Oral Take 1,000 mg by mouth once as needed. For pain    . IBUPROFEN 200 MG PO TABS Oral Take 800 mg by mouth once as needed. For pain    . METHOCARBAMOL 500 MG PO TABS  2 po tid for spasm 30 tablet 0    BP 141/99   Pulse 69  Temp(Src) 98.9 F (37.2 C) (Oral)  Resp 20  Ht 5\' 3"  (1.6 m)  Wt 133 lb (60.328 kg)  BMI 23.56 kg/m2  SpO2 100%  LMP 04/27/2011  Physical Exam  Nursing note and vitals reviewed. Constitutional: She is oriented to person, place, and time. She appears well-developed and well-nourished. No distress.       Anxious but not distreseed  HENT:  Head: Normocephalic and atraumatic.  Eyes: EOM are normal. Pupils are equal, round, and reactive to light.  Neck: Normal range of motion. Neck supple. No tracheal deviation present.  Cardiovascular: Normal rate and regular rhythm.   Pulmonary/Chest: Effort normal. No respiratory distress.       reproducable left costochondral junction pain, no erythema, lungs clear  Abdominal: Soft. She exhibits no distension.  Musculoskeletal: Normal range of motion. She exhibits no edema.  Neurological: She is alert and oriented to person, place, and time. No sensory deficit.  Skin: Skin is warm and dry.  Psychiatric: She has a normal mood and affect. Her behavior is normal.    ED Course  Procedures (including critical care time) DIAGNOSTIC STUDIES: Oxygen Saturation is 100% on room air, normal by my interpretation.    COORDINATION OF CARE:     Labs Reviewed  D-DIMER, QUANTITATIVE  Dg Chest 2 View  05/07/2011  *RADIOLOGY REPORT*  Clinical Data: Left upper anterior chest pain.  Smoker.  CHEST - 2 VIEW 05/07/2011:  Comparison: None.  Findings: Cardiomediastinal silhouette unremarkable.  Lungs clear. Bronchovascular markings normal.  Pulmonary vascularity normal.  No pleural effusions.  No pneumothorax.  Visualized bony thorax intact.  IMPRESSION: Normal examination.  Original Report Authenticated By: Arnell Sieving, M.D.     No diagnosis found.    MDM  Left-sided chest wall pain for the past 3 days associated with shortness of breath. Reproducible pain on palpation. No trauma, lungs clear, no birth control use.  Chest x-ray negative.  D-dimer negative. Treat chest wall pain with anti-inflammatories.   Date: 05/07/2011  Rate: 84  Rhythm: sinus arrhythmia  QRS Axis: normal  Intervals: normal  ST/T Wave abnormalities: normal  Conduction Disutrbances:none  Narrative Interpretation:   Old EKG Reviewed: none available     I personally performed the services described in this documentation, which was scribed in my presence.  The recorded information has been reviewed and considered.       Glynn Octave, MD 05/07/11 9811

## 2011-05-07 NOTE — ED Notes (Addendum)
Feels like a" knot" lt side of chest, Hurts to cough or take a deep breath. No injury,

## 2011-10-11 ENCOUNTER — Emergency Department (HOSPITAL_BASED_OUTPATIENT_CLINIC_OR_DEPARTMENT_OTHER): Payer: Medicaid Other

## 2011-10-11 ENCOUNTER — Encounter (HOSPITAL_BASED_OUTPATIENT_CLINIC_OR_DEPARTMENT_OTHER): Payer: Self-pay | Admitting: Family Medicine

## 2011-10-11 ENCOUNTER — Emergency Department (HOSPITAL_BASED_OUTPATIENT_CLINIC_OR_DEPARTMENT_OTHER)
Admission: EM | Admit: 2011-10-11 | Discharge: 2011-10-11 | Disposition: A | Discharge status: Home / Self Care | Payer: Medicaid Other | Attending: Emergency Medicine | Admitting: Emergency Medicine

## 2011-10-11 DIAGNOSIS — N898 Other specified noninflammatory disorders of vagina: Secondary | ICD-10-CM | POA: Insufficient documentation

## 2011-10-11 DIAGNOSIS — N949 Unspecified condition associated with female genital organs and menstrual cycle: Secondary | ICD-10-CM | POA: Insufficient documentation

## 2011-10-11 DIAGNOSIS — R102 Pelvic and perineal pain: Secondary | ICD-10-CM

## 2011-10-11 LAB — CBC WITH DIFFERENTIAL/PLATELET
Basophils Absolute: 0 10*3/uL (ref 0.0–0.1)
Eosinophils Relative: 2 % (ref 0–5)
Lymphocytes Relative: 29 % (ref 12–46)
Neutro Abs: 4.1 10*3/uL (ref 1.7–7.7)
Neutrophils Relative %: 61 % (ref 43–77)
Platelets: 239 10*3/uL (ref 150–400)
RBC: 4.96 MIL/uL (ref 3.87–5.11)
RDW: 13.4 % (ref 11.5–15.5)
WBC: 6.8 10*3/uL (ref 4.0–10.5)

## 2011-10-11 LAB — COMPREHENSIVE METABOLIC PANEL
ALT: 8 U/L (ref 0–35)
AST: 12 U/L (ref 0–37)
Alkaline Phosphatase: 58 U/L (ref 39–117)
CO2: 24 mEq/L (ref 19–32)
Calcium: 9.6 mg/dL (ref 8.4–10.5)
Chloride: 102 mEq/L (ref 96–112)
GFR calc non Af Amer: >90 mL/min (ref >90)
Potassium: 4.5 mEq/L (ref 3.5–5.1)
Sodium: 136 mEq/L (ref 135–145)

## 2011-10-11 LAB — URINALYSIS, ROUTINE W REFLEX MICROSCOPIC
Bilirubin Urine: NEGATIVE
Glucose, UA: NEGATIVE mg/dL
Ketones, ur: NEGATIVE mg/dL
Protein, ur: NEGATIVE mg/dL
pH: 5.5 (ref 5.0–8.0)

## 2011-10-11 LAB — GC/CHLAMYDIA PROBE AMP, GENITAL
Chlamydia, DNA Probe: NEGATIVE
GC Probe Amp, Genital: NEGATIVE

## 2011-10-11 LAB — WET PREP, GENITAL

## 2011-10-11 LAB — PREGNANCY, URINE: Preg Test, Ur: NEGATIVE

## 2011-10-11 MED ORDER — MORPHINE SULFATE 4 MG/ML IJ SOLN
4.0000 mg | Freq: Once | INTRAMUSCULAR | Status: AC
  Administered 2011-10-11: 4 mg via INTRAVENOUS
  Filled 2011-10-11: qty 1

## 2011-10-11 MED ORDER — HYDROMORPHONE HCL PF 1 MG/ML IJ SOLN
1.0000 mg | Freq: Once | INTRAMUSCULAR | Status: AC
  Administered 2011-10-11: 1 mg via INTRAVENOUS
  Filled 2011-10-11: qty 1

## 2011-10-11 MED ORDER — ONDANSETRON HCL 4 MG/2ML IJ SOLN
4.0000 mg | Freq: Once | INTRAMUSCULAR | Status: AC
  Administered 2011-10-11: 4 mg via INTRAVENOUS
  Filled 2011-10-11: qty 2

## 2011-10-11 MED ORDER — OXYCODONE-ACETAMINOPHEN 5-325 MG PO TABS: 1.0000 | ORAL_TABLET | Freq: Four times a day (QID) | ORAL | Status: DC | PRN

## 2011-10-11 NOTE — ED Notes (Signed)
Patient transported to Ultrasound 

## 2011-10-11 NOTE — ED Notes (Signed)
Pt called out stating iv site was irritated and painful no infiltration noted however pt has redness at side and 3 inches above iv discontinues pt asked for cool pack given.

## 2011-10-11 NOTE — ED Notes (Signed)
Pt c/o RLQ pain x 7 days, LLQ pain started 2 days ago and now pain in back. Pt reports frequent urination and nausea. Pt denies vomiting, diarrhea, vag discharge.

## 2011-10-11 NOTE — ED Provider Notes (Addendum)
History     CSN: [id number]  Arrival date & time 10/11/11  1044   First MD Initiated Contact with Patient 10/11/11 1111      Chief Complaint  Patient presents with  . Abdominal Pain    (Consider location/radiation/quality/duration/timing/severity/associated sxs/prior treatment) Patient is a 31 y.o. female presenting with abdominal pain. The history is provided by the patient.  Abdominal Pain The primary symptoms of the illness include abdominal pain and nausea. The primary symptoms of the illness do not include vomiting, dysuria or vaginal discharge. Episode onset: 7 days ago. The onset of the illness was sudden. The problem has been gradually worsening.  The pain came on suddenly. The abdominal pain is located in the RUQ. The abdominal pain radiates to the LLQ. The severity of the abdominal pain is 10/10. The abdominal pain is relieved by nothing. The abdominal pain is exacerbated by movement.  The patient states that she believes she is currently not pregnant. The patient has not had a change in bowel habit. Additional symptoms associated with the illness include anorexia. Symptoms associated with the illness do not include diaphoresis, urgency, hematuria or frequency. Associated medical issues comments: Ovarian cyst.    History reviewed. No pertinent past medical history.  Past Surgical History  Procedure Date  . Cholecystectomy   . Tubal ligation     No family history on file.  History  Substance Use Topics  . Smoking status: Current Every Day Smoker -- 0.5 packs/day    Types: Cigarettes  . Smokeless tobacco: Not on file  . Alcohol Use: No    OB History    Grav Para Term Preterm Abortions TAB SAB Ect Mult Living                  Review of Systems  Constitutional: Negative for diaphoresis.  Gastrointestinal: Positive for nausea, abdominal pain and anorexia. Negative for vomiting.  Genitourinary: Negative for dysuria, urgency, frequency, hematuria and vaginal  discharge.  All other systems reviewed and are negative.    Allergies  Aspirin  Home Medications   Current Outpatient Rx  Name Route Sig Dispense Refill  . ACETAMINOPHEN 500 MG PO TABS Oral Take 1,000 mg by mouth once as needed. For pain      Pulse 77  Temp 98.8 F (37.1 C) (Oral)  Resp 16  Ht 5\' 3"  (1.6 m)  Wt 145 lb (65.772 kg)  BMI 25.69 kg/m2  SpO2 99%  LMP 09/27/2011  Physical Exam  Nursing note and vitals reviewed. Constitutional: She is oriented to person, place, and time. She appears well-developed and well-nourished. No distress.  HENT:  Head: Normocephalic and atraumatic.  Mouth/Throat: Oropharynx is clear and moist.  Eyes: Conjunctivae normal and EOM are normal. Pupils are equal, round, and reactive to light.  Neck: Normal range of motion. Neck supple.  Cardiovascular: Normal rate, regular rhythm and intact distal pulses.   No murmur heard. Pulmonary/Chest: Effort normal and breath sounds normal. No respiratory distress. She has no wheezes. She has no rales.  Abdominal: Soft. Normal appearance. She exhibits no distension. There is tenderness. There is guarding. There is no rebound.    Genitourinary: Uterus normal. Cervix exhibits discharge. Cervix exhibits no motion tenderness and no friability. Right adnexum displays tenderness. Right adnexum displays no mass and no fullness. Left adnexum displays no tenderness. No tenderness around the vagina. Vaginal discharge found.  Musculoskeletal: Normal range of motion. She exhibits no edema and no tenderness.  Neurological: She is alert and oriented to  person, place, and time.  Skin: Skin is warm and dry. No rash noted. No erythema.  Psychiatric: She has a normal mood and affect. Her behavior is normal.    ED Course  Procedures (including critical care time)  Labs Reviewed  URINALYSIS, ROUTINE W REFLEX MICROSCOPIC - Abnormal; Notable for the following:    APPearance CLOUDY (*)     All other components within  normal limits  WET PREP, GENITAL - Abnormal; Notable for the following:    Clue Cells Wet Prep HPF POC FEW (*)     WBC, Wet Prep HPF POC FEW (*)     All other components within normal limits  CBC WITH DIFFERENTIAL - Abnormal; Notable for the following:    Hemoglobin 15.6 (*)     HCT 46.8 (*)     All other components within normal limits  COMPREHENSIVE METABOLIC PANEL - Abnormal; Notable for the following:    Total Bilirubin 0.1 (*)     All other components within normal limits  PREGNANCY, URINE  GC/CHLAMYDIA PROBE AMP, GENITAL   US Transvaginal Non-ob  10/11/2011  *RADIOLOGY REPORT*  Clinical Data: Right-sided pelvic pain.  Previous ovarian cysts. LMP 09/27/2011.  TRANSABDOMINAL AND TRANSVAGINAL ULTRASOUND OF PELVIS  Technique:  Both transabdominal and transvaginal ultrasound examinations of the pelvis were performed.  Transabdominal technique was performed for global imaging of the pelvis including uterus, ovaries, adnexal regions, and pelvic cul-de-sac.  It was necessary to proceed with endovaginal exam following the transabdominal exam to visualize the left ovarian cystic lesion.  Comparison:  01/20/2011  Findings: Uterus:  8.2 x 4.7 x 5.9 cm.  No fibroids or other uterine mass identified.  Endometrium: Double layer thickness measures 9 mm transvaginally. No focal lesion visualized.  Tiny echogenic foci seen at endometrial - myometrial junction likely represent tiny calcifications, and of doubtful clinical significance.  Right ovary: 3.2 x 2.1 x 2.0 cm.  Normal appearance.  Left ovary: 2.5 x 2.6 x 3.0 cm.  1.7 cm follicle noted.  Normal appearance.  Previously noted hemorrhagic cyst has resolved.  Other Findings:  No free fluid  IMPRESSION: Negative.  No evidence of pelvic mass or other significant abnormality.   Original Report Authenticated By: Danae Orleans, M.D.    US Pelvis Complete  10/11/2011  *RADIOLOGY REPORT*  Clinical Data: Right-sided pelvic pain.  Previous ovarian cysts. LMP  09/27/2011.  TRANSABDOMINAL AND TRANSVAGINAL ULTRASOUND OF PELVIS  Technique:  Both transabdominal and transvaginal ultrasound examinations of the pelvis were performed.  Transabdominal technique was performed for global imaging of the pelvis including uterus, ovaries, adnexal regions, and pelvic cul-de-sac.  It was necessary to proceed with endovaginal exam following the transabdominal exam to visualize the left ovarian cystic lesion.  Comparison:  01/20/2011  Findings: Uterus:  8.2 x 4.7 x 5.9 cm.  No fibroids or other uterine mass identified.  Endometrium: Double layer thickness measures 9 mm transvaginally. No focal lesion visualized.  Tiny echogenic foci seen at endometrial - myometrial junction likely represent tiny calcifications, and of doubtful clinical significance.  Right ovary: 3.2 x 2.1 x 2.0 cm.  Normal appearance.  Left ovary: 2.5 x 2.6 x 3.0 cm.  1.7 cm follicle noted.  Normal appearance.  Previously noted hemorrhagic cyst has resolved.  Other Findings:  No free fluid  IMPRESSION: Negative.  No evidence of pelvic mass or other significant abnormality.   Original Report Authenticated By: Danae Orleans, M.D.      1. Pelvic pain in female  MDM   Patient with abdominal pain that started abruptly 7 days ago which has not improved but continued to worsen. She denies any vaginal discharge but does have a history of a cyst. She denies any urinary symptoms and has been nauseated. Patient's UPT is negative and UA is unremarkable. On pelvic exam she has significant right adnexal tenderness but no cervical motion tenderness.  Low concern for appendicitis based on the history provided. Transvaginal ultrasound pending and patient given pain control  1:42 PM Labs wnl.  Pt well appearing after pain meds.  U/S unrevealing however due to pain may have been a ruptured cyst.  Feel that if pt would have had appy for >1 week would have significant peritoneal signs by now and free fluid on U/S which she  does not.  Given strict return precautions and d/ced home.       Gwyneth Sprout, MD 10/11/11 1343  Gwyneth Sprout, MD 10/11/11 1344

## 2011-12-11 ENCOUNTER — Emergency Department (HOSPITAL_BASED_OUTPATIENT_CLINIC_OR_DEPARTMENT_OTHER)
Admission: EM | Admit: 2011-12-11 | Discharge: 2011-12-11 | Disposition: A | Discharge status: Home / Self Care | Payer: Medicaid Other | Attending: Emergency Medicine | Admitting: Emergency Medicine

## 2011-12-11 ENCOUNTER — Encounter (HOSPITAL_BASED_OUTPATIENT_CLINIC_OR_DEPARTMENT_OTHER): Payer: Self-pay | Admitting: *Deleted

## 2011-12-11 DIAGNOSIS — Z9889 Other specified postprocedural states: Secondary | ICD-10-CM | POA: Insufficient documentation

## 2011-12-11 DIAGNOSIS — K0889 Other specified disorders of teeth and supporting structures: Secondary | ICD-10-CM

## 2011-12-11 DIAGNOSIS — F17200 Nicotine dependence, unspecified, uncomplicated: Secondary | ICD-10-CM | POA: Insufficient documentation

## 2011-12-11 DIAGNOSIS — K089 Disorder of teeth and supporting structures, unspecified: Secondary | ICD-10-CM | POA: Insufficient documentation

## 2011-12-11 MED ORDER — HYDROCODONE-ACETAMINOPHEN 5-325 MG PO TABS: 2.0000 | ORAL_TABLET | ORAL | Status: AC | PRN

## 2011-12-11 MED ORDER — PENICILLIN V POTASSIUM 500 MG PO TABS: 500.0000 mg | ORAL_TABLET | Freq: Four times a day (QID) | ORAL | Status: AC

## 2011-12-11 NOTE — ED Provider Notes (Signed)
History     CSN: [id number]  Arrival date & time 12/11/11  1621   First MD Initiated Contact with Patient 12/11/11 1701      Chief Complaint  Patient presents with  . Dental Pain    (Consider location/radiation/quality/duration/timing/severity/associated sxs/prior treatment) Patient is a 31 y.o. female presenting with tooth pain. The history is provided by the patient. No language interpreter was used.  Dental PainThe primary symptoms include mouth pain and dental injury. The symptoms began more than 1 week ago. The symptoms are worsening. The symptoms are new. The symptoms occur constantly.  Additional symptoms include: gum swelling. Additional symptoms do not include: facial swelling.  Pt unalbe to get a dental appointment until January  History reviewed. No pertinent past medical history.  Past Surgical History  Procedure Date  . Cholecystectomy   . Tubal ligation     History reviewed. No pertinent family history.  History  Substance Use Topics  . Smoking status: Current Every Day Smoker -- 0.5 packs/day    Types: Cigarettes  . Smokeless tobacco: Not on file  . Alcohol Use: No    OB History    Grav Para Term Preterm Abortions TAB SAB Ect Mult Living                  Review of Systems  HENT: Negative for facial swelling.   All other systems reviewed and are negative.    Allergies  Aspirin  Home Medications   Current Outpatient Rx  Name  Route  Sig  Dispense  Refill  . ACETAMINOPHEN 500 MG PO TABS   Oral   Take 1,000 mg by mouth once as needed. For pain         . OXYCODONE-ACETAMINOPHEN 5-325 MG PO TABS   Oral   Take 1-2 tablets by mouth every 6 (six) hours as needed for pain.   15 tablet   0     BP 135/87  Pulse 100  Temp 99.3 F (37.4 C) (Oral)  Resp 20  Ht 5\' 2"  (1.575 m)  Wt 142 lb (64.411 kg)  BMI 25.97 kg/m2  SpO2 100%  LMP 11/27/2011  Physical Exam  Vitals reviewed. Constitutional: She appears well-developed and  well-nourished.  HENT:  Head: Normocephalic and atraumatic.  Mouth/Throat: Oropharynx is clear and moist.       Swollen left gumline  Eyes: Conjunctivae normal and EOM are normal. Pupils are equal, round, and reactive to light.  Neck: Normal range of motion.  Cardiovascular: Normal rate.     ED Course  Procedures (including critical care time)  Labs Reviewed - No data to display No results found.   No diagnosis found.    MDM  Pt advised to call Dr. Leanord Asal to be seen for evaluation.   Pt given rx for pcn and hydrocodone.       Lonia Skinner [place], [place] 12/11/11 1734

## 2011-12-11 NOTE — ED Notes (Signed)
Pt describes dental pain since Thanksgiving. Has called for appt, but cannot be seen until Jan.

## 2011-12-12 NOTE — ED Provider Notes (Signed)
Medical screening examination/treatment/procedure(s) were performed by non-physician practitioner and as supervising physician I was immediately available for consultation/collaboration.  Doug Sou, MD 12/12/11 [phone]

## 2012-01-31 ENCOUNTER — Emergency Department (HOSPITAL_BASED_OUTPATIENT_CLINIC_OR_DEPARTMENT_OTHER)
Admission: EM | Admit: 2012-01-31 | Discharge: 2012-01-31 | Disposition: A | Discharge status: Home / Self Care | Payer: Medicaid Other | Attending: Emergency Medicine | Admitting: Emergency Medicine

## 2012-01-31 ENCOUNTER — Emergency Department (HOSPITAL_BASED_OUTPATIENT_CLINIC_OR_DEPARTMENT_OTHER): Payer: Medicaid Other

## 2012-01-31 ENCOUNTER — Encounter (HOSPITAL_BASED_OUTPATIENT_CLINIC_OR_DEPARTMENT_OTHER): Payer: Self-pay

## 2012-01-31 DIAGNOSIS — R110 Nausea: Secondary | ICD-10-CM | POA: Insufficient documentation

## 2012-01-31 DIAGNOSIS — Z3202 Encounter for pregnancy test, result negative: Secondary | ICD-10-CM | POA: Insufficient documentation

## 2012-01-31 DIAGNOSIS — F17200 Nicotine dependence, unspecified, uncomplicated: Secondary | ICD-10-CM | POA: Insufficient documentation

## 2012-01-31 DIAGNOSIS — R109 Unspecified abdominal pain: Secondary | ICD-10-CM | POA: Insufficient documentation

## 2012-01-31 LAB — HEPATIC FUNCTION PANEL
ALT: 6 U/L (ref 0–35)
Alkaline Phosphatase: 50 U/L (ref 39–117)
Bilirubin, Direct: <0.1 mg/dL (ref 0.0–0.3)

## 2012-01-31 LAB — URINALYSIS, ROUTINE W REFLEX MICROSCOPIC
Bilirubin Urine: NEGATIVE
Hgb urine dipstick: NEGATIVE
Specific Gravity, Urine: 1.018 (ref 1.005–1.030)
Urobilinogen, UA: 1 mg/dL (ref 0.0–1.0)

## 2012-01-31 LAB — WET PREP, GENITAL: Trich, Wet Prep: NONE SEEN

## 2012-01-31 LAB — CBC
HCT: 43.9 % (ref 36.0–46.0)
MCV: 94.6 fL (ref 78.0–100.0)
RBC: 4.64 MIL/uL (ref 3.87–5.11)
WBC: 6.8 10*3/uL (ref 4.0–10.5)

## 2012-01-31 LAB — BASIC METABOLIC PANEL
BUN: 12 mg/dL (ref 6–23)
CO2: 23 mEq/L (ref 19–32)
Chloride: 103 mEq/L (ref 96–112)
Creatinine, Ser: 0.6 mg/dL (ref 0.50–1.10)

## 2012-01-31 MED ORDER — IOHEXOL 300 MG/ML  SOLN
50.0000 mL | Freq: Once | INTRAMUSCULAR | Status: AC | PRN
  Administered 2012-01-31: 50 mL via ORAL

## 2012-01-31 MED ORDER — IOHEXOL 300 MG/ML  SOLN
100.0000 mL | Freq: Once | INTRAMUSCULAR | Status: AC | PRN
  Administered 2012-01-31: 100 mL via INTRAVENOUS

## 2012-01-31 MED ORDER — HYDROCODONE-ACETAMINOPHEN 5-325 MG PO TABS: 1.0000 | ORAL_TABLET | Freq: Four times a day (QID) | ORAL | Status: DC | PRN

## 2012-01-31 MED ORDER — MORPHINE SULFATE 4 MG/ML IJ SOLN
4.0000 mg | Freq: Once | INTRAMUSCULAR | Status: AC
  Administered 2012-01-31: 4 mg via INTRAVENOUS
  Filled 2012-01-31: qty 1

## 2012-01-31 NOTE — ED Notes (Signed)
Assisted with pelvic exam.

## 2012-01-31 NOTE — ED Notes (Signed)
Patient transported to CT 

## 2012-01-31 NOTE — ED Provider Notes (Signed)
History     CSN: [id number]  Arrival date & time 01/31/12  1108   First MD Initiated Contact with Patient 01/31/12 1223      Chief Complaint  Patient presents with  . Abdominal Pain    (Consider location/radiation/quality/duration/timing/severity/associated sxs/prior treatment) Patient is a 32 y.o. female presenting with abdominal pain. The history is provided by the patient.  Abdominal Pain The primary symptoms of the illness include abdominal pain and nausea. The primary symptoms of the illness do not include fever, shortness of breath, vomiting, diarrhea, hematemesis, dysuria, vaginal discharge or vaginal bleeding. The current episode started more than 2 days ago (1 week ago). The onset of the illness was gradual. The problem has not changed since onset. The abdominal pain began more than 2 days ago (1 week). The pain came on gradually. The abdominal pain has been unchanged since its onset. The abdominal pain is located in the RUQ and RLQ. The abdominal pain radiates to the LLQ. The severity of the abdominal pain is 8/10. The abdominal pain is relieved by nothing.  The patient has not had a change in bowel habit. Symptoms associated with the illness do not include chills, heartburn, constipation, frequency or back pain.    History reviewed. No pertinent past medical history.  Past Surgical History  Procedure Date  . Cholecystectomy   . Tubal ligation     No family history on file.  History  Substance Use Topics  . Smoking status: Current Every Day Smoker -- 0.5 packs/day    Types: Cigarettes  . Smokeless tobacco: Not on file  . Alcohol Use: No    OB History    Grav Para Term Preterm Abortions TAB SAB Ect Mult Living                  Review of Systems  Constitutional: Negative for fever and chills.  Respiratory: Negative for shortness of breath.   Gastrointestinal: Positive for nausea and abdominal pain. Negative for heartburn, vomiting, diarrhea, constipation and  hematemesis.  Genitourinary: Negative for dysuria, frequency, vaginal bleeding and vaginal discharge.  Musculoskeletal: Negative for back pain.  All other systems reviewed and are negative.    Allergies  Aspirin  Home Medications   Current Outpatient Rx  Name  Route  Sig  Dispense  Refill  . ACETAMINOPHEN 500 MG PO TABS   Oral   Take 1,000 mg by mouth once as needed. For pain         . OXYCODONE-ACETAMINOPHEN 5-325 MG PO TABS   Oral   Take 1-2 tablets by mouth every 6 (six) hours as needed for pain.   15 tablet   0     BP 124/89  Pulse 83  Temp 98.4 F (36.9 C) (Oral)  Resp 18  SpO2 100%  LMP 01/24/2012  Physical Exam  Nursing note and vitals reviewed. Constitutional: She is oriented to person, place, and time. She appears well-developed and well-nourished. No distress.  HENT:  Head: Normocephalic and atraumatic.  Eyes: EOM are normal. Pupils are equal, round, and reactive to light.  Neck: Normal range of motion. Neck supple.  Cardiovascular: Normal rate and regular rhythm.  Exam reveals no friction rub.   No murmur heard. Pulmonary/Chest: Effort normal and breath sounds normal. No respiratory distress. She has no wheezes. She has no rales.  Abdominal: Soft. She exhibits no distension and no mass. There is tenderness (lower abdomen with some voluntary guarding). There is no rebound.  Musculoskeletal: Normal range of motion.  She exhibits no edema.  Neurological: She is alert and oriented to person, place, and time.  Skin: She is not diaphoretic.    ED Course  Procedures (including critical care time)  Labs Reviewed  URINALYSIS, ROUTINE W REFLEX MICROSCOPIC - Abnormal; Notable for the following:    APPearance CLOUDY (*)     All other components within normal limits  WET PREP, GENITAL - Abnormal; Notable for the following:    Clue Cells Wet Prep HPF POC MODERATE (*)     WBC, Wet Prep HPF POC FEW (*)     All other components within normal limits  PREGNANCY,  URINE  CBC  BASIC METABOLIC PANEL  HEPATIC FUNCTION PANEL  LIPASE, BLOOD  GC/CHLAMYDIA PROBE AMP   hello Ct Abdomen Pelvis W Contrast  01/31/2012  *RADIOLOGY REPORT*  Clinical Data: Right lower abdominal pain, prior cholecystectomy  CT ABDOMEN AND PELVIS WITH CONTRAST  Technique:  Multidetector CT imaging of the abdomen and pelvis was performed following the standard protocol during bolus administration of intravenous contrast.  Contrast:  100 ml Omnipaque-300 IV  Comparison: 02/27/2006  Findings: Lung bases are clear.  Liver, spleen pancreas, and adrenal glands are within normal limits.  Status post cholecystectomy.  No intrahepatic ductal dilatation. Common duct measures 7 mm, likely postsurgical.  Kidneys are within normal limits.  No hydronephrosis.  No evidence of bowel obstruction.  Normal appendix.  No evidence of abdominal aortic aneurysm.  No abdominopelvic ascites.  No suspicious abdominopelvic lymphadenopathy.  Arcuate uterine configuration.  No adnexal masses.  Bladder is within normal limits.  Mild degenerative changes of the lower thoracic spine with stable mild anterior wedging at T12.  IMPRESSION: Normal appendix.  No evidence of bowel obstruction.  Prior cholecystectomy.  No CT findings to account for the patient's right lower abdominal pain.   Original Report Authenticated By: [name], M.D.      1. Abdominal  pain, other specified site       MDM   Patient is a 32 year old female presents with abdominal pain. Present for one week. Described as sharp, radiating to right upper quadrant and the rest of her abdomen. Some associated nausea, however no vomiting or diarrhea. Last problem yesterday and was normal. Denies any fevers. Denies any urinary or vaginal symptoms. Patient is afebrile and vital signs are stable here. Exam shows moderate tenderness in her lower pockets with some voluntary guarding. She has no peritoneal signs. Prior quadrant is mildly tender, however lower  quadrants are more tender. We'll perform pelvic exam. She had similar presentation to this 4 months ago with a negative transvaginal ultrasound. Pain at that time was thought to be due to ovarian cyst disease. CT scan normal. Unclear etiology of patient's pain. Given small amount pain medicine l and a resource guide to f/u with a regular provider. Patient told me that this is similar to her previous ovarian cyst disease that she gets recurrently every 2-3 months.        Elwin Mocha, MD 01/31/12 [phone]

## 2012-01-31 NOTE — ED Notes (Signed)
C/o lower abd pain x 1 week, nausea-denies urinary s/s and vaginal d/-last BM yesterday

## 2012-02-01 NOTE — ED Provider Notes (Signed)
  I  I agree with the history, physical, assessment, and plan of care, with the following exceptions: None  I was present for the following procedures: None Time Spent in Critical Care of the patient: None Time spent in discussions with the patient and family: 0  Anelise Staron Corlis Leak, MD 02/01/12 1330

## 2012-03-24 ENCOUNTER — Emergency Department (HOSPITAL_COMMUNITY): Payer: Medicaid Other

## 2012-03-24 ENCOUNTER — Emergency Department (HOSPITAL_COMMUNITY)
Admission: EM | Admit: 2012-03-24 | Discharge: 2012-03-24 | Disposition: A | Discharge status: Home / Self Care | Payer: Medicaid Other | Attending: Emergency Medicine | Admitting: Emergency Medicine

## 2012-03-24 ENCOUNTER — Encounter (HOSPITAL_COMMUNITY): Payer: Self-pay | Admitting: *Deleted

## 2012-03-24 DIAGNOSIS — N949 Unspecified condition associated with female genital organs and menstrual cycle: Secondary | ICD-10-CM | POA: Insufficient documentation

## 2012-03-24 DIAGNOSIS — F17200 Nicotine dependence, unspecified, uncomplicated: Secondary | ICD-10-CM | POA: Insufficient documentation

## 2012-03-24 DIAGNOSIS — G8929 Other chronic pain: Secondary | ICD-10-CM

## 2012-03-24 DIAGNOSIS — M549 Dorsalgia, unspecified: Secondary | ICD-10-CM | POA: Insufficient documentation

## 2012-03-24 DIAGNOSIS — Z8679 Personal history of other diseases of the circulatory system: Secondary | ICD-10-CM | POA: Insufficient documentation

## 2012-03-24 HISTORY — DX: Pelvic and perineal pain: R10.2

## 2012-03-24 HISTORY — DX: Dorsalgia, unspecified: M54.9

## 2012-03-24 HISTORY — DX: Other chronic pain: G89.29

## 2012-03-24 LAB — PREGNANCY, URINE: Preg Test, Ur: NEGATIVE

## 2012-03-24 LAB — WET PREP, GENITAL

## 2012-03-24 LAB — URINALYSIS, ROUTINE W REFLEX MICROSCOPIC
Hgb urine dipstick: NEGATIVE
Leukocytes, UA: NEGATIVE
Nitrite: NEGATIVE
Protein, ur: NEGATIVE mg/dL
Urobilinogen, UA: 0.2 mg/dL (ref 0.0–1.0)

## 2012-03-24 MED ORDER — OXYCODONE-ACETAMINOPHEN 5-325 MG PO TABS
2.0000 | ORAL_TABLET | Freq: Once | ORAL | Status: AC
  Administered 2012-03-24: 2 via ORAL
  Filled 2012-03-24: qty 2

## 2012-03-24 MED ORDER — FENTANYL CITRATE 0.05 MG/ML IJ SOLN
100.0000 ug | Freq: Once | INTRAMUSCULAR | Status: AC
  Administered 2012-03-24: 100 ug via INTRAVENOUS

## 2012-03-24 MED ORDER — FENTANYL CITRATE 0.05 MG/ML IJ SOLN
50.0000 ug | Freq: Once | INTRAMUSCULAR | Status: AC
  Administered 2012-03-24: 50 ug via INTRAVENOUS

## 2012-03-24 MED ORDER — HYDROCODONE-ACETAMINOPHEN 5-325 MG PO TABS: ORAL_TABLET | ORAL | Status: DC

## 2012-03-24 MED ORDER — FENTANYL CITRATE 0.05 MG/ML IJ SOLN
100.0000 ug | Freq: Once | INTRAMUSCULAR | Status: DC
  Filled 2012-03-24: qty 2

## 2012-03-24 MED ORDER — FENTANYL CITRATE 0.05 MG/ML IJ SOLN
INTRAMUSCULAR | Status: AC
  Administered 2012-03-24: 50 ug via INTRAVENOUS
  Filled 2012-03-24: qty 2

## 2012-03-24 NOTE — ED Notes (Signed)
C/o suprapubic pain onset last night; denies n/v/d; denies vaginal bleeding or discharge.  LMP 03/08/12.

## 2012-03-24 NOTE — ED Provider Notes (Signed)
History     CSN: [id number]  Arrival date & time 03/24/12  1153   First MD Initiated Contact with Patient 03/24/12 1337      Chief Complaint  Patient presents with  . Abdominal Pain     HPI Pt was seen at 1345.   Per pt, c/o gradual onset and persistence of constant acute flair of her chronic LLQ pelvic "pain" for the past 2 years, worse since yesterday. Describes the pain as per her usual chronic pain pattern.  Denies N/V/D, no fevers, no back pain, no rash, no CP/SOB, no black or blood in stools, no dysuria/hematuria, no vaginal bleeding/discharge.  The symptoms have been associated with no other complaints. The patient has a significant history of similar symptoms previously, recently being evaluated for this complaint and multiple prior evals for same.  Pt states she has not f/u with her PMD or OB/GYN for same as previously instructed.     Past Medical History  Diagnosis Date  . Chronic back pain   . Chronic pelvic pain in female   . Migraine headache     Past Surgical History  Procedure Laterality Date  . Cholecystectomy    . Tubal ligation       History  Substance Use Topics  . Smoking status: Current Every Day Smoker -- 0.50 packs/day    Types: Cigarettes  . Smokeless tobacco: Not on file  . Alcohol Use: No    Review of Systems ROS: Statement: All systems negative except as marked or noted in the HPI; Constitutional: Negative for fever and chills. ; ; Eyes: Negative for eye pain, redness and discharge. ; ; ENMT: Negative for ear pain, hoarseness, nasal congestion, sinus pressure and sore throat. ; ; Cardiovascular: Negative for chest pain, palpitations, diaphoresis, dyspnea and peripheral edema. ; ; Respiratory: Negative for cough, wheezing and stridor. ; ; Gastrointestinal: Negative for nausea, vomiting, diarrhea, abdominal pain, blood in stool, hematemesis, jaundice and rectal bleeding. . ; ; Genitourinary: Negative for dysuria, flank pain and hematuria. ; ; GYN:   +pelvic pain. No vaginal bleeding, no vaginal discharge, no vulvar pain.;; Musculoskeletal: Negative for back pain and neck pain. Negative for swelling and trauma.; ; Skin: Negative for pruritus, rash, abrasions, blisters, bruising and skin lesion.; ; Neuro: Negative for headache, lightheadedness and neck stiffness. Negative for weakness, altered level of consciousness , altered mental status, extremity weakness, paresthesias, involuntary movement, seizure and syncope.       Allergies  Ultram and Aspirin  Home Medications   Current Outpatient Rx  Name  Route  Sig  Dispense  Refill  . acetaminophen (TYLENOL) 500 MG tablet   Oral   Take 1,000-1,500 mg by mouth 3 (three) times daily as needed (headache/back pain). For pain           BP 117/74  Pulse 68  Temp(Src) 98.3 F (36.8 C) (Oral)  Resp 16  Ht 5\' 3"  (1.6 m)  Wt 145 lb (65.772 kg)  BMI 25.69 kg/m2  SpO2 98%  LMP 03/08/2012  Physical Exam 1350: Physical examination:  Nursing notes reviewed; Vital signs and O2 SAT reviewed;  Constitutional: Well developed, Well nourished, Well hydrated, In no acute distress; Head:  Normocephalic, atraumatic; Eyes: EOMI, PERRL, No scleral icterus; ENMT: Mouth and pharynx normal, Mucous membranes moist; Neck: Supple, Full range of motion, No lymphadenopathy; Cardiovascular: Regular rate and rhythm, No murmur, rub, or gallop; Respiratory: Breath sounds clear & equal bilaterally, No rales, rhonchi, wheezes.  Speaking full sentences with ease,  Normal respiratory effort/excursion; Chest: Nontender, Movement normal; Abdomen: Soft, +suprapubic, LLQ tenderness to palp. No rebound or guarding. Nondistended, Normal bowel sounds; Genitourinary: No CVA tenderness. Pelvic exam performed with permission of pt and female ED tech assist during exam.  External genitalia w/o lesions. Vaginal vault with thick white discharge.  Cervix w/o lesions, not friable, GC/chlam and wet prep obtained and sent to lab.  Bimanual  exam w/o CMT or right adnexal tenderness. +suprapubic and left pelvic tenderness.;; Extremities: Pulses normal, No tenderness, No edema, No calf edema or asymmetry.; Neuro: AA&Ox3, Major CN grossly intact.  Speech clear. No gross focal motor or sensory deficits in extremities.; Skin: Color normal, Warm, Dry.   ED Course  Procedures     MDM  MDM Reviewed: previous chart, vitals and nursing note Reviewed previous: CT scan and ultrasound Interpretation: ultrasound and labs   Results for orders placed during the hospital encounter of 03/24/12  WET PREP, GENITAL      Result Value Range   Yeast Wet Prep HPF POC NONE SEEN  NONE SEEN   Trich, Wet Prep NONE SEEN  NONE SEEN   Clue Cells Wet Prep HPF POC NONE SEEN  NONE SEEN   WBC, Wet Prep HPF POC RARE (*) NONE SEEN  URINALYSIS, ROUTINE W REFLEX MICROSCOPIC      Result Value Range   Color, Urine YELLOW  YELLOW   APPearance CLEAR  CLEAR   Specific Gravity, Urine 1.020  1.005 - 1.030   pH 7.0  5.0 - 8.0   Glucose, UA NEGATIVE  NEGATIVE mg/dL   Hgb urine dipstick NEGATIVE  NEGATIVE   Bilirubin Urine NEGATIVE  NEGATIVE   Ketones, ur NEGATIVE  NEGATIVE mg/dL   Protein, ur NEGATIVE  NEGATIVE mg/dL   Urobilinogen, UA 0.2  0.0 - 1.0 mg/dL   Nitrite NEGATIVE  NEGATIVE   Leukocytes, UA NEGATIVE  NEGATIVE  PREGNANCY, URINE      Result Value Range   Preg Test, Ur NEGATIVE  NEGATIVE    [place] Art/ven Flow Abd Pelv Doppler 03/24/2012  *RADIOLOGY REPORT*  Clinical Data:  Left lower abdominal and pelvic pain.  Evaluate for ovarian torsion or pelvic abscess.  TRANSABDOMINAL AND TRANSVAGINAL ULTRASOUND OF PELVIS DOPPLER ULTRASOUND OF OVARIES  Technique:  Both transabdominal and transvaginal ultrasound examinations of the pelvis were performed. Transabdominal technique was performed for global imaging of the pelvis including uterus, ovaries, adnexal regions, and pelvic cul-de-sac.  It was necessary to proceed with endovaginal exam following the  transabdominal exam to visualize the ovaries.  Color and duplex Doppler ultrasound was utilized to evaluate blood flow to the ovaries.  Comparison:  CT of the abdomen and pelvis 01/31/2012.  Findings:  Uterus:  Normal in size and echotexture measuring 8.1 x 4.8 x 6.0 cm.  Endometrium:  Normal thickness measuring up to 10 mm.  There are multiple echogenic areas in the periphery of the endometrium at the interface with the myometrium, with a nonspecific appearance, favored to reflect evidence of adenomyosis.  Right ovary: Normal in echotexture and appearance measuring 3.1 x 1.9 x 2.2 cm.  Multiple small anechoic lesions with increased through transmission, compatible with small follicles.  Left ovary:    Normal in echotexture and appearance measuring 3.7 x 2.1 x 3.0 cm.  Multiple small anechoic lesions with increased through transmission, compatible with small follicles.  Pulsed Doppler evaluation demonstrates normal low resistance wave forms in the ovaries bilaterally.  A small amount of free fluid the cul-de-sac.  IMPRESSION: 1.  No evidence of ovarian torsion.  No evidence of tubo-ovarian abscess.  No acute findings to account for the patient's symptoms. 2.  Trace volume of free fluid the cul-de-sac is presumably physiologic in this young female patient. 3.  Appearance of the endometrium is unusual and may suggest adenomyosis, as above.   Original Report Authenticated By: Trudie Reed, M.D.       1730:  No acute findings on workup today.  Pt strongly encouraged to f/u with her OB/GYN for good continuity of care and control of her chronic pain.  Verb understanding.  Dx and testing d/w pt and family.  Questions answered.  Verb understanding, agreeable to d/c home with outpt f/u.        Laray Anger, DO 03/27/12 1219

## 2012-03-24 NOTE — ED Notes (Signed)
LLQ pain since last night.  Denies n/v/d, denies GU sx.  Reports painful intercourse.  Denies vaginal d/c.

## 2012-05-08 ENCOUNTER — Emergency Department (HOSPITAL_COMMUNITY)
Admission: EM | Admit: 2012-05-08 | Discharge: 2012-05-08 | Disposition: A | Discharge status: Home / Self Care | Payer: Medicaid Other | Attending: Emergency Medicine | Admitting: Emergency Medicine

## 2012-05-08 ENCOUNTER — Encounter (HOSPITAL_COMMUNITY): Payer: Self-pay | Admitting: *Deleted

## 2012-05-08 DIAGNOSIS — F17200 Nicotine dependence, unspecified, uncomplicated: Secondary | ICD-10-CM | POA: Insufficient documentation

## 2012-05-08 DIAGNOSIS — R109 Unspecified abdominal pain: Secondary | ICD-10-CM

## 2012-05-08 DIAGNOSIS — M549 Dorsalgia, unspecified: Secondary | ICD-10-CM | POA: Insufficient documentation

## 2012-05-08 DIAGNOSIS — R000 Tachycardia, unspecified: Secondary | ICD-10-CM | POA: Insufficient documentation

## 2012-05-08 DIAGNOSIS — G8929 Other chronic pain: Secondary | ICD-10-CM

## 2012-05-08 DIAGNOSIS — Z3202 Encounter for pregnancy test, result negative: Secondary | ICD-10-CM | POA: Insufficient documentation

## 2012-05-08 DIAGNOSIS — R110 Nausea: Secondary | ICD-10-CM | POA: Insufficient documentation

## 2012-05-08 DIAGNOSIS — Z8679 Personal history of other diseases of the circulatory system: Secondary | ICD-10-CM | POA: Insufficient documentation

## 2012-05-08 LAB — HEPATIC FUNCTION PANEL
AST: 15 U/L (ref 0–37)
Bilirubin, Direct: <0.1 mg/dL (ref 0.0–0.3)

## 2012-05-08 LAB — URINALYSIS, ROUTINE W REFLEX MICROSCOPIC
Glucose, UA: NEGATIVE mg/dL
Leukocytes, UA: NEGATIVE
Specific Gravity, Urine: 1.011 (ref 1.005–1.030)
pH: 6 (ref 5.0–8.0)

## 2012-05-08 LAB — CBC WITH DIFFERENTIAL/PLATELET
Eosinophils Relative: 2 % (ref 0–5)
Lymphocytes Relative: 32 % (ref 12–46)
Lymphs Abs: 2 10*3/uL (ref 0.7–4.0)
MCV: 94.2 fL (ref 78.0–100.0)
Platelets: 375 10*3/uL (ref 150–400)
RBC: 4.82 MIL/uL (ref 3.87–5.11)
WBC: 6.3 10*3/uL (ref 4.0–10.5)

## 2012-05-08 LAB — BASIC METABOLIC PANEL
CO2: 27 mEq/L (ref 19–32)
Calcium: 9.5 mg/dL (ref 8.4–10.5)
Glucose, Bld: 77 mg/dL (ref 70–99)
Potassium: 3.9 mEq/L (ref 3.5–5.1)
Sodium: 138 mEq/L (ref 135–145)

## 2012-05-08 LAB — POCT PREGNANCY, URINE: Preg Test, Ur: NEGATIVE

## 2012-05-08 MED ORDER — HYDROMORPHONE HCL PF 1 MG/ML IJ SOLN
1.0000 mg | Freq: Once | INTRAMUSCULAR | Status: AC
  Administered 2012-05-08: 1 mg via INTRAVENOUS
  Filled 2012-05-08: qty 1

## 2012-05-08 MED ORDER — ONDANSETRON HCL 4 MG/2ML IJ SOLN
4.0000 mg | Freq: Once | INTRAMUSCULAR | Status: AC
  Administered 2012-05-08: 4 mg via INTRAVENOUS
  Filled 2012-05-08: qty 2

## 2012-05-08 MED ORDER — HYDROCODONE-ACETAMINOPHEN 5-325 MG PO TABS: ORAL_TABLET | ORAL | Status: DC

## 2012-05-08 NOTE — ED Provider Notes (Signed)
History     CSN: [id number]  Arrival date & time 05/08/12  1529   First MD Initiated Contact with Patient 05/08/12 1628      Chief Complaint  Patient presents with  . Abdominal Pain    (Consider location/radiation/quality/duration/timing/severity/associated sxs/prior treatment) Patient is a 32 y.o. female presenting with abdominal pain. The history is provided by the patient. No language interpreter was used.  Abdominal Pain Associated symptoms: nausea   Associated symptoms: no chills and no fever    Pt is a 32yo female with hx of chronic abd pain presenting with abd pain that has worsened over the past week associated with occasional nausea.  Pain is 10/10 sharp and cramping in nature, worse in umbilical region. Reports cholecystectomy 4-56yrs ago.  Pain feels similar to that.  Holding pressure on abdomin makes pain better.  Lying flat or lying on stomach makes pain worse.  Has tried tylenol w/o relief. Pt also reports some chest pain last night, similar to anxiety attack which she has had in the past.  No chest pain or SOB at this time.  Denies fever, v/d.  Denies change in bowel habits.  Denies dysuria, vaginal pain or discharge.  LMP ended 2 days ago, was normal.     Past Medical History  Diagnosis Date  . Chronic back pain   . Chronic pelvic pain in female   . Migraine headache     Past Surgical History  Procedure Laterality Date  . Cholecystectomy    . Tubal ligation      No family history on file.  History  Substance Use Topics  . Smoking status: Current Every Day Smoker -- 0.50 packs/day    Types: Cigarettes  . Smokeless tobacco: Never Used  . Alcohol Use: No    OB History   Grav Para Term Preterm Abortions TAB SAB Ect Mult Living                  Review of Systems  Constitutional: Negative for fever and chills.  Gastrointestinal: Positive for nausea and abdominal pain.  All other systems reviewed and are negative.    Allergies  Ultram and  Aspirin  Home Medications   Current Outpatient Rx  Name  Route  Sig  Dispense  Refill  . acetaminophen (TYLENOL) 500 MG tablet   Oral   Take 1,000-1,500 mg by mouth 3 (three) times daily as needed (headache/back pain). For pain         . HYDROcodone-acetaminophen (NORCO/VICODIN) 5-325 MG per tablet      Take 1-2 pills every 6-8 hours as needed for pain.   10 tablet   0     BP 115/65  Pulse 69  Temp(Src) 98.5 F (36.9 C) (Oral)  Resp 18  Ht 5\' 3"  (1.6 m)  Wt 152 lb (68.947 kg)  BMI 26.93 kg/m2  SpO2 99%  LMP 05/05/2012  Physical Exam  Nursing note and vitals reviewed. Constitutional: She appears well-developed and well-nourished. She appears distressed ( mild).  Pt sitting on edge of bed holding abd.  HENT:  Head: Normocephalic and atraumatic.  Eyes: Conjunctivae are normal. No scleral icterus.  Neck: Normal range of motion.  Cardiovascular: Normal heart sounds.  Tachycardia present.   Pulmonary/Chest: Effort normal and breath sounds normal. No respiratory distress. She has no wheezes. She has no rales. She exhibits no tenderness.  Abdominal: Soft. Bowel sounds are normal. She exhibits no distension and no mass. There is tenderness ( diffuse, greater in umbilical  region). There is no rebound and no guarding.  Genitourinary:  No CVA tenderness   Musculoskeletal: Normal range of motion.  Neurological: She is alert.  Skin: Skin is warm and dry. She is not diaphoretic.  Psychiatric: Her mood appears anxious.    ED Course  Procedures (including critical care time)  Labs Reviewed  CBC WITH DIFFERENTIAL - Abnormal; Notable for the following:    Hemoglobin 15.2 (*)    All other components within normal limits  BASIC METABOLIC PANEL  LIPASE, BLOOD  URINALYSIS, ROUTINE W REFLEX MICROSCOPIC  HEPATIC FUNCTION PANEL  POCT PREGNANCY, URINE   No results found.   Date: 05/08/2012  Rate: 55  Rhythm: normal sinus rhythm  QRS Axis: normal  Intervals: normal  ST/T  Wave abnormalities: normal  Conduction Disutrbances:none  Narrative Interpretation:   Old EKG Reviewed: unchanged   1. Chronic abdominal pain       MDM  Pt with chronic abd pain presenting with abd pain that worsened over past week.  Pt has had neg abd CT in Jan.  And neg. Vaginal ultrasound with pelvic doppler in March.  Do not believe additional CT will be of benefit at this time. Abd exam: soft, non-distended, diffuse abd tenderness worse in umbilical region.  Pt not c/o pelvic or vaginal pain at this time. Labs-hepatic function, BMP, Lipase, UA, and CBC are all normal.  Urine Preg-neg. EKG: sinus brady.  Not concerned for appendicitis-pt is afebrile and no leukocytosis. Not concerned with SBO or ectopic preg.  Discussed pt with Dr. Karma Ganja.  Will discharge pt home on pain medication and f/u with  GI.   Vitals: unremarkable. Discharged in stable condition.    Discussed pt with attending during ED encounter.          Abigail Finner, PA-C 05/09/12 1754

## 2012-05-08 NOTE — ED Notes (Signed)
Pt states having severe abdominal pain, sharp tightness pain, states on/off x 1 week, denies n/v/d, denies urinary symptoms.

## 2012-05-11 NOTE — ED Provider Notes (Signed)
Medical screening examination/treatment/procedure(s) were performed by non-physician practitioner and as supervising physician I was immediately available for consultation/collaboration.  Martha K Linker, MD 05/11/12 0933 

## 2012-06-09 ENCOUNTER — Encounter (HOSPITAL_BASED_OUTPATIENT_CLINIC_OR_DEPARTMENT_OTHER): Payer: Self-pay | Admitting: *Deleted

## 2012-06-09 ENCOUNTER — Emergency Department (HOSPITAL_BASED_OUTPATIENT_CLINIC_OR_DEPARTMENT_OTHER): Payer: Medicaid Other

## 2012-06-09 ENCOUNTER — Emergency Department (HOSPITAL_BASED_OUTPATIENT_CLINIC_OR_DEPARTMENT_OTHER)
Admission: EM | Admit: 2012-06-09 | Discharge: 2012-06-09 | Disposition: A | Discharge status: Home / Self Care | Payer: Medicaid Other | Attending: Emergency Medicine | Admitting: Emergency Medicine

## 2012-06-09 DIAGNOSIS — N949 Unspecified condition associated with female genital organs and menstrual cycle: Secondary | ICD-10-CM | POA: Insufficient documentation

## 2012-06-09 DIAGNOSIS — G8929 Other chronic pain: Secondary | ICD-10-CM | POA: Insufficient documentation

## 2012-06-09 DIAGNOSIS — T148XXA Other injury of unspecified body region, initial encounter: Secondary | ICD-10-CM

## 2012-06-09 DIAGNOSIS — M549 Dorsalgia, unspecified: Secondary | ICD-10-CM | POA: Insufficient documentation

## 2012-06-09 DIAGNOSIS — Z8679 Personal history of other diseases of the circulatory system: Secondary | ICD-10-CM | POA: Insufficient documentation

## 2012-06-09 DIAGNOSIS — S40019A Contusion of unspecified shoulder, initial encounter: Secondary | ICD-10-CM | POA: Insufficient documentation

## 2012-06-09 DIAGNOSIS — F17200 Nicotine dependence, unspecified, uncomplicated: Secondary | ICD-10-CM | POA: Insufficient documentation

## 2012-06-09 DIAGNOSIS — S0993XA Unspecified injury of face, initial encounter: Secondary | ICD-10-CM | POA: Insufficient documentation

## 2012-06-09 MED ORDER — HYDROCODONE-ACETAMINOPHEN 5-325 MG PO TABS: 2.0000 | ORAL_TABLET | ORAL | Status: DC | PRN
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
## 2012-06-17 ENCOUNTER — Encounter (HOSPITAL_COMMUNITY): Payer: Self-pay

## 2012-06-17 ENCOUNTER — Emergency Department (HOSPITAL_COMMUNITY)
Admission: EM | Admit: 2012-06-17 | Discharge: 2012-06-17 | Disposition: A | Discharge status: Home / Self Care | Payer: Medicaid Other | Attending: Emergency Medicine | Admitting: Emergency Medicine

## 2012-06-17 DIAGNOSIS — M549 Dorsalgia, unspecified: Secondary | ICD-10-CM

## 2012-06-17 DIAGNOSIS — M545 Low back pain, unspecified: Secondary | ICD-10-CM | POA: Insufficient documentation

## 2012-06-17 DIAGNOSIS — F17200 Nicotine dependence, unspecified, uncomplicated: Secondary | ICD-10-CM | POA: Insufficient documentation

## 2012-06-17 DIAGNOSIS — R209 Unspecified disturbances of skin sensation: Secondary | ICD-10-CM | POA: Insufficient documentation

## 2012-06-17 DIAGNOSIS — R5381 Other malaise: Secondary | ICD-10-CM | POA: Insufficient documentation

## 2012-06-17 DIAGNOSIS — Z8669 Personal history of other diseases of the nervous system and sense organs: Secondary | ICD-10-CM | POA: Insufficient documentation

## 2012-06-17 DIAGNOSIS — Z8742 Personal history of other diseases of the female genital tract: Secondary | ICD-10-CM | POA: Insufficient documentation

## 2012-06-17 DIAGNOSIS — G8929 Other chronic pain: Secondary | ICD-10-CM | POA: Insufficient documentation

## 2012-06-17 DIAGNOSIS — R5383 Other fatigue: Secondary | ICD-10-CM | POA: Insufficient documentation

## 2012-06-17 MED ORDER — OXYCODONE-ACETAMINOPHEN 5-325 MG PO TABS
2.0000 | ORAL_TABLET | Freq: Once | ORAL | Status: AC
  Administered 2012-06-17: 2 via ORAL
  Filled 2012-06-17: qty 2

## 2012-06-17 NOTE — ED Notes (Signed)
Pt c/o lower back pain radiating to buttocks x 2 or 3 days.  Reports history of back pain.

## 2012-06-17 NOTE — ED Notes (Signed)
Pt c/o back pain since this morning. Pt states she fell while doing dishes. Pt has hx of back pain. Pt ambulatory on arrival.

## 2012-06-17 NOTE — ED Provider Notes (Signed)
History  This chart was scribed for Joya Gaskins, MD by Greggory Stallion, ED Scribe. This patient was seen in room APFT22/APFT22 and the patient's care was started at 1:16 PM.  CSN: [id number]  Arrival date & time 06/17/12  1227    Chief Complaint  Patient presents with  . Back Pain    Patient is a 32 y.o. female presenting with back pain. The history is provided by the patient. No language interpreter was used.  Back Pain Location:  Lumbar spine Radiates to: buttocks. Pain severity:  Mild Onset quality:  Gradual Duration:  3 days Timing:  Constant Progression:  Unchanged Chronicity:  New Associated symptoms: numbness (right leg) and weakness (right leg)   Associated symptoms: no abdominal pain, no chest pain, no dysuria and no fever     HPI Comments: [name] is a 32 y.o. female with h/o chronic back pain who presents to the Emergency Department complaining of gradual onset, constant lower back pain that radiated to her buttocks that started 3 days ago. She states she was washing dishes 3 days ago and just fell straight down and that her "legs gave out" but no LOC or head injury and did not hit head or neck. She states she has right leg pain, weakness, and numbess. Pt denies injury or LOC. Pt denies fever, CP, cough, SOB, abdominal pain, nausea, emesis, urinary symptoms, HA.   Past Medical History  Diagnosis Date  . Chronic back pain   . Chronic pelvic pain in female   . Migraine headache     Past Surgical History  Procedure Laterality Date  . Cholecystectomy    . Tubal ligation      No family history on file.  History  Substance Use Topics  . Smoking status: Current Every Day Smoker -- 0.50 packs/day    Types: Cigarettes  . Smokeless tobacco: Never Used  . Alcohol Use: No    OB History   Grav Para Term Preterm Abortions TAB SAB Ect Mult Living                  Review of Systems  Constitutional: Negative for fever.  HENT: Negative for neck pain.    Respiratory: Negative for cough and shortness of breath.   Cardiovascular: Negative for chest pain.  Gastrointestinal: Negative for nausea, vomiting, abdominal pain and diarrhea.  Genitourinary: Negative for dysuria.  Musculoskeletal: Positive for back pain.  Neurological: Positive for weakness (right leg) and numbness (right leg).    Allergies  Ultram and Aspirin  Home Medications   Current Outpatient Rx  Name  Route  Sig  Dispense  Refill  . acetaminophen (TYLENOL) 500 MG tablet   Oral   Take 1,000-1,500 mg by mouth 3 (three) times daily as needed (headache/back pain). For pain           BP 142/78  Pulse 104  Temp(Src) 98.8 F (37.1 C) (Oral)  Resp 18  Ht 5\' 3"  (1.6 m)  Wt 147 lb (66.679 kg)  BMI 26.05 kg/m2  SpO2 100%  LMP 05/19/2012  Physical Exam  CONSTITUTIONAL: Well developed/well nourished HEAD: Normocephalic/atraumatic EYES: EOMI/PERRL ENMT: Mucous membranes moist NECK: supple no meningeal signs SPINE:No bruising/crepitance/stepoffs noted to spine, diffuse paraspinal tenderness CV: S1/S2 noted, no murmurs/rubs/gallops noted LUNGS: Lungs are clear to auscultation bilaterally, no apparent distress ABDOMEN: soft, nontender, no rebound or guarding GU:no cva tenderness NEURO: Awake/alert, equal distal motor: hip flexion/knee flexion/extension, ankle dorsi/plantar flexion, great toe extension intact bilaterally, no clonus  bilaterally, Equal patellar/achilles reflex noted.  Pt is able to ambulate. EXTREMITIES: pulses normal, full ROM SKIN: warm, color normal PSYCH: no abnormalities of mood noted   ED Course  Procedures  DIAGNOSTIC STUDIES: Oxygen Saturation is 100% on RA, normal by my interpretation.    COORDINATION OF CARE: 1:46 PM-Discussed treatment plan with pt at bedside and pt agreed to plan.   Pt initially stated this was all new, then admitted she has had back pain and pain/weakness in her leg previous to today.  She is ambulatory.  There is no  focal motor deficit in her lower extremities She denied any urinary symptoms or incontinence Doubt she has uti Advised need for followup as outpatient for management of her back pain and may need outpatient MRI at some point   MDM  Nursing notes including past medical history and social history reviewed and considered in documentation       I personally performed the services described in this documentation, which was scribed in my presence. The recorded information has been reviewed and is accurate.     Joya Gaskins, MD 06/17/12 [phone]

## 2012-08-29 ENCOUNTER — Emergency Department (HOSPITAL_COMMUNITY): Payer: Medicaid Other

## 2012-08-29 ENCOUNTER — Encounter (HOSPITAL_COMMUNITY): Payer: Self-pay | Admitting: *Deleted

## 2012-08-29 ENCOUNTER — Emergency Department (HOSPITAL_COMMUNITY)
Admission: EM | Admit: 2012-08-29 | Discharge: 2012-08-29 | Disposition: A | Discharge status: Home / Self Care | Payer: Medicaid Other | Attending: Emergency Medicine | Admitting: Emergency Medicine

## 2012-08-29 DIAGNOSIS — F17200 Nicotine dependence, unspecified, uncomplicated: Secondary | ICD-10-CM | POA: Insufficient documentation

## 2012-08-29 DIAGNOSIS — M549 Dorsalgia, unspecified: Secondary | ICD-10-CM | POA: Insufficient documentation

## 2012-08-29 DIAGNOSIS — G43909 Migraine, unspecified, not intractable, without status migrainosus: Secondary | ICD-10-CM | POA: Insufficient documentation

## 2012-08-29 DIAGNOSIS — G8929 Other chronic pain: Secondary | ICD-10-CM | POA: Insufficient documentation

## 2012-08-29 DIAGNOSIS — N83209 Unspecified ovarian cyst, unspecified side: Secondary | ICD-10-CM | POA: Insufficient documentation

## 2012-08-29 DIAGNOSIS — N949 Unspecified condition associated with female genital organs and menstrual cycle: Secondary | ICD-10-CM | POA: Insufficient documentation

## 2012-08-29 DIAGNOSIS — R000 Tachycardia, unspecified: Secondary | ICD-10-CM | POA: Insufficient documentation

## 2012-08-29 DIAGNOSIS — R102 Pelvic and perineal pain: Secondary | ICD-10-CM

## 2012-08-29 DIAGNOSIS — Z3202 Encounter for pregnancy test, result negative: Secondary | ICD-10-CM | POA: Insufficient documentation

## 2012-08-29 DIAGNOSIS — M79609 Pain in unspecified limb: Secondary | ICD-10-CM | POA: Insufficient documentation

## 2012-08-29 DIAGNOSIS — N83202 Unspecified ovarian cyst, left side: Secondary | ICD-10-CM

## 2012-08-29 LAB — URINALYSIS, ROUTINE W REFLEX MICROSCOPIC
Leukocytes, UA: NEGATIVE
Nitrite: NEGATIVE
Protein, ur: NEGATIVE mg/dL
Specific Gravity, Urine: >1.03 — ABNORMAL HIGH (ref 1.005–1.030)
Urobilinogen, UA: 0.2 mg/dL (ref 0.0–1.0)

## 2012-08-29 LAB — CBC WITH DIFFERENTIAL/PLATELET
Basophils Absolute: 0 10*3/uL (ref 0.0–0.1)
Lymphocytes Relative: 37 % (ref 12–46)
Lymphs Abs: 2.2 10*3/uL (ref 0.7–4.0)
Neutro Abs: 3.3 10*3/uL (ref 1.7–7.7)
Neutrophils Relative %: 56 % (ref 43–77)
Platelets: 328 10*3/uL (ref 150–400)
RBC: 4.77 MIL/uL (ref 3.87–5.11)
RDW: 14.2 % (ref 11.5–15.5)
WBC: 5.9 10*3/uL (ref 4.0–10.5)

## 2012-08-29 LAB — BASIC METABOLIC PANEL
CO2: 29 mEq/L (ref 19–32)
Calcium: 9.3 mg/dL (ref 8.4–10.5)
Glucose, Bld: 83 mg/dL (ref 70–99)
Potassium: 4 mEq/L (ref 3.5–5.1)
Sodium: 138 mEq/L (ref 135–145)

## 2012-08-29 LAB — WET PREP, GENITAL: Clue Cells Wet Prep HPF POC: NONE SEEN

## 2012-08-29 LAB — URINE MICROSCOPIC-ADD ON

## 2012-08-29 LAB — POCT PREGNANCY, URINE: Preg Test, Ur: NEGATIVE

## 2012-08-29 MED ORDER — KETOROLAC TROMETHAMINE 60 MG/2ML IM SOLN
60.0000 mg | Freq: Once | INTRAMUSCULAR | Status: AC
  Administered 2012-08-29: 60 mg via INTRAMUSCULAR
  Filled 2012-08-29: qty 2

## 2012-08-29 MED ORDER — HYDROCODONE-ACETAMINOPHEN 5-325 MG PO TABS
1.0000 | ORAL_TABLET | Freq: Once | ORAL | Status: AC
  Administered 2012-08-29: 1 via ORAL
  Filled 2012-08-29: qty 1

## 2012-08-29 MED ORDER — HYDROCODONE-ACETAMINOPHEN 5-325 MG PO TABS: 1.0000 | ORAL_TABLET | ORAL | Status: DC | PRN

## 2012-08-29 NOTE — ED Notes (Signed)
Pt c/o pain in left side radiating down left leg.  Denies injury, denies n/v/d, denies any urinary symptoms.

## 2012-08-29 NOTE — ED Provider Notes (Signed)
CSN: [id number]     Arrival date & time 08/29/12  1028 History   First MD Initiated Contact with Patient 08/29/12 1052     Chief Complaint  Patient presents with  . Abdominal Pain   (Consider location/radiation/quality/duration/timing/severity/associated sxs/prior Treatment) Patient is a 32 y.o. female presenting with abdominal pain. The history is provided by the patient.  Abdominal Pain Pain location:  LLQ Pain quality: sharp and throbbing   Pain radiates to:  L flank and L leg Pain severity:  Severe (9/10) Timing:  Constant Progression:  Worsening Chronicity:  New Relieved by:  Nothing Worsened by:  Movement, coughing and palpation Ineffective treatments:  Acetaminophen and heat Associated symptoms: no chills, no dysuria, no fever, no nausea, no sore throat, no vaginal bleeding, no vaginal discharge and no vomiting    [name] is a 32 y.o. female who presents to the ED with left side pain that radiates to the left flank area and to the left leg. History of back problems. Has been to the ED before and told she may need MRI but she never followed up for that. This pain is different from back that it is in the left side of her abdomen and radiates to the back and down her thigh.   Past Medical History  Diagnosis Date  . Chronic back pain   . Chronic pelvic pain in female   . Migraine headache    Past Surgical History  Procedure Laterality Date  . Cholecystectomy    . Tubal ligation     No family history on file. History  Substance Use Topics  . Smoking status: Current Every Day Smoker -- 0.50 packs/day    Types: Cigarettes  . Smokeless tobacco: Never Used  . Alcohol Use: No   OB History   Grav Para Term Preterm Abortions TAB SAB Ect Mult Living                 Review of Systems  Constitutional: Negative for fever and chills.  HENT: Negative for ear pain and sore throat.   Eyes: Negative for visual disturbance.  Gastrointestinal: Positive for abdominal pain.  Negative for nausea and vomiting.  Genitourinary: Positive for flank pain and pelvic pain. Negative for dysuria, urgency, frequency, vaginal bleeding and vaginal discharge.  Skin: Negative for rash.  Neurological: Negative for syncope, light-headedness and headaches.  Psychiatric/Behavioral: The patient is not nervous/anxious.     Allergies  Ultram and Aspirin  Home Medications   Current Outpatient Rx  Name  Route  Sig  Dispense  Refill  . acetaminophen (TYLENOL) 500 MG tablet   Oral   Take 1,000-1,500 mg by mouth 3 (three) times daily as needed (headache/back pain). For pain         . HYDROcodone-acetaminophen (NORCO/VICODIN) 5-325 MG per tablet   Oral   Take 1 tablet by mouth every 4 (four) hours as needed.   15 tablet   0    BP 139/103  Pulse 101  Temp(Src) 98.5 F (36.9 C) (Oral)  Resp 20  Ht 5\' 3"  (1.6 m)  Wt 143 lb (64.864 kg)  BMI 25.34 kg/m2  SpO2 100%  LMP 08/13/2012 Physical Exam  Nursing note and vitals reviewed. Constitutional: She is oriented to person, place, and time. She appears well-developed and well-nourished. No distress.  HENT:  Head: Normocephalic and atraumatic.  Right Ear: External ear normal.  Left Ear: External ear normal.  Eyes: Conjunctivae and EOM are normal.  Neck: Normal range of motion.  Neck supple.  Cardiovascular: Tachycardia present.   Pulmonary/Chest: Effort normal and breath sounds normal.  Abdominal: Soft. Normal appearance and bowel sounds are normal. There is tenderness in the left lower quadrant. There is no rigidity, no rebound, no guarding and no CVA tenderness.  Genitourinary:  External genitalia without lesions, positive CMT, left adnexal tenderness. Uterus upper limit of normal.   Musculoskeletal:       Lumbar back: She exhibits decreased range of motion, tenderness and spasm.  Neurological: She is alert and oriented to person, place, and time. No cranial nerve deficit.  Skin: Skin is warm and dry.  Psychiatric: She  has a normal mood and affect. Her behavior is normal.   Results for orders placed during the hospital encounter of 08/29/12 (from the past 24 hour(s))  URINALYSIS, ROUTINE W REFLEX MICROSCOPIC     Status: Abnormal   Collection Time    08/29/12 11:38 AM      Result Value Range   Color, Urine YELLOW  YELLOW   APPearance CLEAR  CLEAR   Specific Gravity, Urine >1.030 (*) 1.005 - 1.030   pH 6.0  5.0 - 8.0   Glucose, UA NEGATIVE  NEGATIVE mg/dL   Hgb urine dipstick SMALL (*) NEGATIVE   Bilirubin Urine NEGATIVE  NEGATIVE   Ketones, ur NEGATIVE  NEGATIVE mg/dL   Protein, ur NEGATIVE  NEGATIVE mg/dL   Urobilinogen, UA 0.2  0.0 - 1.0 mg/dL   Nitrite NEGATIVE  NEGATIVE   Leukocytes, UA NEGATIVE  NEGATIVE  URINE MICROSCOPIC-ADD ON     Status: Abnormal   Collection Time    08/29/12 11:38 AM      Result Value Range   Squamous Epithelial / LPF MANY (*) RARE   RBC / HPF 3-6  <3 RBC/hpf   Bacteria, UA MANY (*) RARE  POCT PREGNANCY, URINE     Status: None   Collection Time    08/29/12 11:40 AM      Result Value Range   Preg Test, Ur NEGATIVE  NEGATIVE  CBC WITH DIFFERENTIAL     Status: None   Collection Time    08/29/12 11:48 AM      Result Value Range   WBC 5.9  4.0 - 10.5 K/uL   RBC 4.77  3.87 - 5.11 MIL/uL   Hemoglobin 14.8  12.0 - 15.0 g/dL   HCT 91.4  78.2 - 95.6 %   MCV 94.5  78.0 - 100.0 fL   MCH 31.0  26.0 - 34.0 pg   MCHC 32.8  30.0 - 36.0 g/dL   RDW 21.3  08.6 - 57.8 %   Platelets 328  150 - 400 K/uL   Neutrophils Relative % 56  43 - 77 %   Neutro Abs 3.3  1.7 - 7.7 K/uL   Lymphocytes Relative 37  12 - 46 %   Lymphs Abs 2.2  0.7 - 4.0 K/uL   Monocytes Relative 5  3 - 12 %   Monocytes Absolute 0.3  0.1 - 1.0 K/uL   Eosinophils Relative 2  0 - 5 %   Eosinophils Absolute 0.1  0.0 - 0.7 K/uL   Basophils Relative 1  0 - 1 %   Basophils Absolute 0.0  0.0 - 0.1 K/uL  BASIC METABOLIC PANEL     Status: None   Collection Time    08/29/12 11:48 AM      Result Value Range    Sodium 138  135 - 145 mEq/L   Potassium 4.0  3.5 - 5.1 mEq/L   Chloride 102  96 - 112 mEq/L   CO2 29  19 - 32 mEq/L   Glucose, Bld 83  70 - 99 mg/dL   BUN 6  6 - 23 mg/dL   Creatinine, Ser 1.61  0.50 - 1.10 mg/dL   Calcium 9.3  8.4 - 09.6 mg/dL   GFR calc non Af Amer >90  >90 mL/min   GFR calc Af Amer >90  >90 mL/min  WET PREP, GENITAL     Status: Abnormal   Collection Time    08/29/12 12:40 PM      Result Value Range   Yeast Wet Prep HPF POC NONE SEEN  NONE SEEN   Trich, Wet Prep NONE SEEN  NONE SEEN   Clue Cells Wet Prep HPF POC NONE SEEN  NONE SEEN   WBC, Wet Prep HPF POC RARE (*) NONE SEEN    ED Course  Procedures US Transvaginal Non-ob  08/29/2012   *RADIOLOGY REPORT*  Clinical Data: History of pelvic pain. LMP 08/13/2012.  TRANSABDOMINAL AND TRANSVAGINAL ULTRASOUND OF PELVIS Technique:  Both transabdominal and transvaginal ultrasound examinations of the pelvis were performed. Transabdominal technique was performed for global imaging of the pelvis including uterus, ovaries, adnexal regions, and pelvic cul-de-sac.  It was necessary to proceed with endovaginal exam following the transabdominal exam to visualize the details of the parenchyma of the myometrium, endometrium, and ovaries.  Comparison:  Ultrasound 03/21 1014.  CT 04/17/2012.  Findings:  Uterus: Uterus measures 9.0 x 5.1 x 6.6 cm. No myometrial abnormalities were identified.  Uterus is anteverted.  Endometrium: Endometrial thickness measured 10.7 mm.  There is a trace amount of fluid within the in the endometrial cavity. Several focal hyperechoic areas are seen in the endometrium and may reflect small calcifications.  No discrete mass was identified.  Right ovary:  Right ovary measured 2.9 x 1.8 x 1.7 cm.  No right ovarian or right adnexal lesion is identified.  Left ovary: The left ovary measured 3.6 x 2.7 x 3.0 cm.  A dominant simple appearing cystic area measured 2.6 x 2.1 x 2.0 cm. On the previous study a dominant cystic area  was seen measuring 1.2 x 1.2 x 1.0 cm.  Other findings: No free fluid is seen in the pelvis.  IMPRESSION: No myometrial abnormality is evident.  There is a trace amount of fluid within the endometrial cavity. Several focal hyperechoic areas are seen in the endometrium may reflect small calcifications.  Normal appearance of right ovary.  Simple appearing cystic area in the left ovary with the cystic area having greatest diameter of 2.6 cm.  This is larger than on the previous study but is still small.  There is no evidence of solid nodularity or papillary area.  No color flow was seen within it. This likely reflects a functional cyst.  No solid mass could be identified.  No free fluid was seen in the pelvis.   Original Report Authenticated By: Onalee Hua Call   us Pelvis Complete  08/29/2012   *RADIOLOGY REPORT*  Clinical Data: History of pelvic pain. LMP 08/13/2012.  TRANSABDOMINAL AND TRANSVAGINAL ULTRASOUND OF PELVIS Technique:  Both transabdominal and transvaginal ultrasound examinations of the pelvis were performed. Transabdominal technique was performed for global imaging of the pelvis including uterus, ovaries, adnexal regions, and pelvic cul-de-sac.  It was necessary to proceed with endovaginal exam following the transabdominal exam to visualize the details of the parenchyma of the myometrium, endometrium, and ovaries.  Comparison:  Ultrasound 03/21 1014.  CT 04/17/2012.  Findings:  Uterus: Uterus measures 9.0 x 5.1 x 6.6 cm. No myometrial abnormalities were identified.  Uterus is anteverted.  Endometrium: Endometrial thickness measured 10.7 mm.  There is a trace amount of fluid within the in the endometrial cavity. Several focal hyperechoic areas are seen in the endometrium and may reflect small calcifications.  No discrete mass was identified.  Right ovary:  Right ovary measured 2.9 x 1.8 x 1.7 cm.  No right ovarian or right adnexal lesion is identified.  Left ovary: The left ovary measured 3.6 x 2.7 x 3.0  cm.  A dominant simple appearing cystic area measured 2.6 x 2.1 x 2.0 cm. On the previous study a dominant cystic area was seen measuring 1.2 x 1.2 x 1.0 cm.  Other findings: No free fluid is seen in the pelvis.  IMPRESSION: No myometrial abnormality is evident.  There is a trace amount of fluid within the endometrial cavity. Several focal hyperechoic areas are seen in the endometrium may reflect small calcifications.  Normal appearance of right ovary.  Simple appearing cystic area in the left ovary with the cystic area having greatest diameter of 2.6 cm.  This is larger than on the previous study but is still small.  There is no evidence of solid nodularity or papillary area.  No color flow was seen within it. This likely reflects a functional cyst.  No solid mass could be identified.  No free fluid was seen in the pelvis.   Original Report Authenticated By: Onalee Hua Call   After pain medication and IV fluids patient is feeling better. Abdomen soft without rebound or guarding. Continues to have tenderness in LLQ with deep palpation. MDM  32 y.o. female with left side pelvic pain. Left ovarian cyst. Will treat with medication for pain and inflammation. Patient stable for discharge home without any immediate complications. She is to follow up with Dr. Emelda Fear I have reviewed this patient's vital signs, nurses notes, appropriate labs and imaging.  I have discussed findings and plan of care with the patient and she voices understanding.    Medication List    TAKE these medications       HYDROcodone-acetaminophen 5-325 MG per tablet  Commonly known as:  NORCO/VICODIN  Take 1 tablet by mouth every 4 (four) hours as needed.      ASK your doctor about these medications       acetaminophen 500 MG tablet  Commonly known as:  TYLENOL  Take 1,000-1,500 mg by mouth 3 (three) times daily as needed (headache/back pain). For pain           Janne Napoleon, NP 08/30/12 1500

## 2012-08-29 NOTE — ED Notes (Signed)
NP aware pt still rating pain at 10, prescription given.

## 2012-08-29 NOTE — ED Notes (Signed)
Pt c/o left side flank and abd pain that started a few days ago, states that she feels "week", denies any urinary symptoms, n/v,

## 2012-08-31 LAB — GC/CHLAMYDIA PROBE AMP
CT Probe RNA: NEGATIVE
GC Probe RNA: NEGATIVE

## 2012-09-02 NOTE — ED Provider Notes (Signed)
Medical screening examination/treatment/procedure(s) were performed by non-physician practitioner and as supervising physician I was immediately available for consultation/collaboration. Devoria Albe, MD, Armando Gang   Ward Givens, MD 09/02/12 [phone]

## 2012-09-16 ENCOUNTER — Emergency Department (HOSPITAL_BASED_OUTPATIENT_CLINIC_OR_DEPARTMENT_OTHER): Payer: Medicaid Other

## 2012-09-16 ENCOUNTER — Encounter (HOSPITAL_BASED_OUTPATIENT_CLINIC_OR_DEPARTMENT_OTHER): Payer: Self-pay | Admitting: Emergency Medicine

## 2012-09-16 ENCOUNTER — Emergency Department (HOSPITAL_BASED_OUTPATIENT_CLINIC_OR_DEPARTMENT_OTHER)
Admission: EM | Admit: 2012-09-16 | Discharge: 2012-09-16 | Disposition: A | Discharge status: Home / Self Care | Payer: Medicaid Other | Attending: Emergency Medicine | Admitting: Emergency Medicine

## 2012-09-16 DIAGNOSIS — R0602 Shortness of breath: Secondary | ICD-10-CM | POA: Insufficient documentation

## 2012-09-16 DIAGNOSIS — Z3202 Encounter for pregnancy test, result negative: Secondary | ICD-10-CM | POA: Insufficient documentation

## 2012-09-16 DIAGNOSIS — G8929 Other chronic pain: Secondary | ICD-10-CM | POA: Insufficient documentation

## 2012-09-16 DIAGNOSIS — R1012 Left upper quadrant pain: Secondary | ICD-10-CM | POA: Insufficient documentation

## 2012-09-16 DIAGNOSIS — Z8679 Personal history of other diseases of the circulatory system: Secondary | ICD-10-CM | POA: Insufficient documentation

## 2012-09-16 DIAGNOSIS — R1110 Vomiting, unspecified: Secondary | ICD-10-CM | POA: Insufficient documentation

## 2012-09-16 DIAGNOSIS — F17200 Nicotine dependence, unspecified, uncomplicated: Secondary | ICD-10-CM | POA: Insufficient documentation

## 2012-09-16 DIAGNOSIS — Z8742 Personal history of other diseases of the female genital tract: Secondary | ICD-10-CM | POA: Insufficient documentation

## 2012-09-16 DIAGNOSIS — R109 Unspecified abdominal pain: Secondary | ICD-10-CM

## 2012-09-16 LAB — PREGNANCY, URINE: Preg Test, Ur: NEGATIVE

## 2012-09-16 LAB — BASIC METABOLIC PANEL
Chloride: 105 mEq/L (ref 96–112)
Creatinine, Ser: 0.8 mg/dL (ref 0.50–1.10)
GFR calc Af Amer: >90 mL/min (ref >90)
GFR calc non Af Amer: >90 mL/min (ref >90)
Potassium: 4.4 mEq/L (ref 3.5–5.1)

## 2012-09-16 LAB — CBC
MCHC: 32.6 g/dL (ref 30.0–36.0)
Platelets: 245 10*3/uL (ref 150–400)
RDW: 13.7 % (ref 11.5–15.5)
WBC: 7.5 10*3/uL (ref 4.0–10.5)

## 2012-09-16 LAB — URINALYSIS, ROUTINE W REFLEX MICROSCOPIC
Bilirubin Urine: NEGATIVE
Nitrite: NEGATIVE
Specific Gravity, Urine: 1.024 (ref 1.005–1.030)
Urobilinogen, UA: 1 mg/dL (ref 0.0–1.0)
pH: 6 (ref 5.0–8.0)

## 2012-09-16 LAB — LIPASE, BLOOD: Lipase: 44 U/L (ref 11–59)

## 2012-09-16 LAB — URINE MICROSCOPIC-ADD ON

## 2012-09-16 MED ORDER — IOHEXOL 300 MG/ML  SOLN
100.0000 mL | Freq: Once | INTRAMUSCULAR | Status: AC | PRN
  Administered 2012-09-16: 100 mL via INTRAVENOUS

## 2012-09-16 MED ORDER — HYDROMORPHONE HCL PF 1 MG/ML IJ SOLN
1.0000 mg | Freq: Once | INTRAMUSCULAR | Status: AC
  Administered 2012-09-16: 1 mg via INTRAVENOUS
  Filled 2012-09-16: qty 1

## 2012-09-16 MED ORDER — IOHEXOL 300 MG/ML  SOLN
50.0000 mL | Freq: Once | INTRAMUSCULAR | Status: AC | PRN
  Administered 2012-09-16: 50 mL via ORAL

## 2012-09-16 NOTE — ED Provider Notes (Signed)
CSN: [id number]     Arrival date & time 09/16/12  1956 History  .This chart was scribed for Dagmar Hait, MD by Ronal Fear, ED Scribe. This patient was seen in room MH02/MH02 and the patient's care was started at 8:49 PM.    Chief Complaint  Patient presents with  . Abdominal Pain    with n/v    Patient is a 32 y.o. female presenting with abdominal pain. The history is provided by the patient. No language interpreter was used.  Abdominal Pain Pain location:  LUQ (the pain is generalized but it's most intense in the LUQ) Pain quality: sharp and stabbing   Pain radiates to:  Back Pain severity:  Moderate Onset quality:  Gradual Duration:  3 days Timing:  Constant Progression:  Worsening Chronicity:  Chronic Relieved by:  Nothing Worsened by:  Nothing tried Ineffective treatments:  None tried Associated symptoms: shortness of breath (mild) and vomiting   Associated symptoms: no chills, no constipation, no diarrhea, no dysuria, no fever, no hematochezia, no hematuria, no nausea, no vaginal bleeding and no vaginal discharge   pt states the pain has kept her from sleep.  LNMP was normal. Pt is not currently on estrogen pills that she stopped taking one month ago.  She denies any trauma to the area. Pt denies a history of kidney stones  Past Medical History  Diagnosis Date  . Chronic back pain   . Chronic pelvic pain in female   . Migraine headache    Past Surgical History  Procedure Laterality Date  . Cholecystectomy    . Tubal ligation     History reviewed. No pertinent family history. History  Substance Use Topics  . Smoking status: Current Every Day Smoker -- 0.50 packs/day    Types: Cigarettes  . Smokeless tobacco: Never Used  . Alcohol Use: No   OB History   Grav Para Term Preterm Abortions TAB SAB Ect Mult Living                 Review of Systems  Constitutional: Negative for fever and chills.  Respiratory: Positive for shortness of breath (mild).    Gastrointestinal: Positive for vomiting and abdominal pain. Negative for nausea, diarrhea, constipation and hematochezia.  Genitourinary: Negative for dysuria, hematuria, vaginal bleeding and vaginal discharge.  All other systems reviewed and are negative.    Allergies  Motrin; Ultram; and Aspirin  Home Medications   Current Outpatient Rx  Name  Route  Sig  Dispense  Refill  . acetaminophen (TYLENOL) 500 MG tablet   Oral   Take 1,000-1,500 mg by mouth 3 (three) times daily as needed (headache/back pain). For pain         . HYDROcodone-acetaminophen (NORCO/VICODIN) 5-325 MG per tablet   Oral   Take 1 tablet by mouth every 4 (four) hours as needed.   15 tablet   0    BP 141/97  Pulse 69  Temp(Src) 98.9 F (37.2 C)  Resp 18  Wt 147 lb (66.679 kg)  BMI 26.05 kg/m2  SpO2 100%  LMP 08/13/2012 Physical Exam  Nursing note and vitals reviewed. Constitutional: She is oriented to person, place, and time. She appears well-developed and well-nourished. No distress.  HENT:  Head: Normocephalic and atraumatic.  Eyes: EOM are normal.  Neck: Neck supple. No tracheal deviation present.  Cardiovascular: Normal rate.   Pulmonary/Chest: Effort normal and breath sounds normal. No respiratory distress. She has no wheezes. She has no rales.  Abdominal: Soft.  There is tenderness (diffuesly. LUQ most prominent.).  Musculoskeletal: Normal range of motion.  Neurological: She is alert and oriented to person, place, and time.  Skin: Skin is warm and dry.  Psychiatric: She has a normal mood and affect. Her behavior is normal.    ED Course  Procedures (including critical care time)  DIAGNOSTIC STUDIES: Oxygen Saturation is 100% on RA, normal by my interpretation.    COORDINATION OF CARE:. 9:10 PM- Pt advised of plan for treatment including reviewing her charts to determine if a CT is necessary and pt agrees.        Labs Review Labs Reviewed  URINALYSIS, ROUTINE W REFLEX  MICROSCOPIC - Abnormal; Notable for the following:    APPearance CLOUDY (*)    Hgb urine dipstick LARGE (*)    All other components within normal limits  URINE MICROSCOPIC-ADD ON - Abnormal; Notable for the following:    Squamous Epithelial / LPF MANY (*)    Bacteria, UA MANY (*)    All other components within normal limits  PREGNANCY, URINE  CBC  BASIC METABOLIC PANEL  LIPASE, BLOOD   Imaging Review Ct Abdomen Pelvis W Contrast  09/16/2012   CLINICAL DATA:  Abdominal pain.  EXAM: CT ABDOMEN AND PELVIS WITH CONTRAST  TECHNIQUE: Multidetector CT imaging of the abdomen and pelvis was performed using the standard protocol following bolus administration of intravenous contrast.  CONTRAST:  50mL OMNIPAQUE IOHEXOL 300 MG/ML SOLN, OMNIPAQUE IOHEXOL 300 MG/ML SOLN  COMPARISON:  04/17/2012.  FINDINGS: BODY WALL: Unremarkable.  LOWER CHEST:  Mediastinum: Unremarkable.  Lungs/pleura: Early emphysematous changes. No discrete cysts.  ABDOMEN/PELVIS:  Liver: No focal abnormality.  Biliary: Cholecystectomy.  Pancreas: Unremarkable.  Spleen: Unremarkable.  Adrenals: Unremarkable.  Kidneys and ureters: No hydronephrosis or stone.  Bladder: Unremarkable.  Bowel: No obstruction. Normal appendix.  Retroperitoneum: No mass or adenopathy.  Peritoneum: No free fluid or gas.  Reproductive: Unremarkable.  Vascular: Age advanced aortic and iliac atherosclerosis.  OSSEOUS: No acute abnormalities. No suspicious lytic or blastic lesions.  IMPRESSION: 1. No acute intra-abdominal abnormality. 2. Age advanced atherosclerosis. Early emphysema.   Electronically Signed   By: Tiburcio Pea   On: 09/16/2012 23:09    MDM   1. Abdominal pain    32 year old female presents with abdominal pain. History of chronic pelvic pain chronic back pain. On record review, she has multiple visits for abdominal pain. Last CT scan in January which normal. Today patient has left upper quadrant pain she states is new. She's never had this  pain before. Denies any fevers, nausea, vomiting, diarrhea, vaginal discharge, urinary pain. Patient has diffuse abdominal pain on exam, most of her pain is centered in her left upper quadrant. We'll scan her abdomen she states that she states this is new pain. I suspect she is here for chronic pain medications. Given one dose narcotics as she is in pain today with her new pain. Labs and CT scan are normal. Patient is discharged home with resource guide and instructed to followup with PCP for referral to pain clinics.   I personally performed the services described in this documentation, which was scribed in my presence. The recorded information has been reviewed and is accurate.     Dagmar Hait, MD 09/16/12 [phone]

## 2012-09-16 NOTE — ED Notes (Signed)
pty reports abd pain x3 days with 1 episode of N/V today also reports occassional syncopal sensations but denies LOC

## 2013-06-19 ENCOUNTER — Emergency Department (HOSPITAL_COMMUNITY): Payer: Medicaid Other

## 2013-06-19 ENCOUNTER — Emergency Department (HOSPITAL_COMMUNITY)
Admission: EM | Admit: 2013-06-19 | Discharge: 2013-06-19 | Disposition: A | Discharge status: Home / Self Care | Payer: Medicaid Other | Attending: Emergency Medicine | Admitting: Emergency Medicine

## 2013-06-19 ENCOUNTER — Encounter (HOSPITAL_COMMUNITY): Payer: Self-pay | Admitting: Emergency Medicine

## 2013-06-19 DIAGNOSIS — Y9389 Activity, other specified: Secondary | ICD-10-CM | POA: Insufficient documentation

## 2013-06-19 DIAGNOSIS — Y929 Unspecified place or not applicable: Secondary | ICD-10-CM | POA: Insufficient documentation

## 2013-06-19 DIAGNOSIS — G8929 Other chronic pain: Secondary | ICD-10-CM | POA: Insufficient documentation

## 2013-06-19 DIAGNOSIS — Z79899 Other long term (current) drug therapy: Secondary | ICD-10-CM | POA: Insufficient documentation

## 2013-06-19 DIAGNOSIS — S9031XA Contusion of right foot, initial encounter: Secondary | ICD-10-CM

## 2013-06-19 DIAGNOSIS — S9030XA Contusion of unspecified foot, initial encounter: Secondary | ICD-10-CM | POA: Insufficient documentation

## 2013-06-19 DIAGNOSIS — IMO0002 Reserved for concepts with insufficient information to code with codable children: Secondary | ICD-10-CM | POA: Insufficient documentation

## 2013-06-19 DIAGNOSIS — R296 Repeated falls: Secondary | ICD-10-CM | POA: Insufficient documentation

## 2013-06-19 DIAGNOSIS — Z8679 Personal history of other diseases of the circulatory system: Secondary | ICD-10-CM | POA: Insufficient documentation

## 2013-06-19 DIAGNOSIS — F17200 Nicotine dependence, unspecified, uncomplicated: Secondary | ICD-10-CM | POA: Insufficient documentation

## 2013-06-19 MED ORDER — HYDROCODONE-ACETAMINOPHEN 5-325 MG PO TABS
1.0000 | ORAL_TABLET | Freq: Once | ORAL | Status: AC
  Administered 2013-06-19: 1 via ORAL
  Filled 2013-06-19: qty 1

## 2013-06-19 MED ORDER — PREDNISONE 20 MG PO TABS: 60.0000 mg | ORAL_TABLET | Freq: Every day | ORAL | Status: DC

## 2013-06-19 MED ORDER — PREDNISONE 50 MG PO TABS
60.0000 mg | ORAL_TABLET | Freq: Once | ORAL | Status: AC
  Administered 2013-06-19: 20:00:00 60 mg via ORAL
  Filled 2013-06-19 (×2): qty 1

## 2013-06-19 MED ORDER — HYDROCODONE-ACETAMINOPHEN 5-325 MG PO TABS: 1.0000 | ORAL_TABLET | ORAL | Status: DC | PRN

## 2013-06-19 NOTE — ED Notes (Signed)
Pt verbalized understanding of no driving within 4 hours of taking pain med due to med causes drowsiness  

## 2013-06-19 NOTE — ED Notes (Signed)
Rt foot pain, dropped a couch on foot yesterday, Went to urgent care but was not given any wrap or crutches.  Had x ray and does not know the results

## 2013-06-19 NOTE — Discharge Instructions (Signed)
Contusion A contusion is a deep bruise. Contusions are the result of an injury that caused bleeding under the skin. The contusion may turn blue, purple, or yellow. Minor injuries will give you a painless contusion, but more severe contusions may stay painful and swollen for a few weeks.  CAUSES  A contusion is usually caused by a blow, trauma, or direct force to an area of the body. SYMPTOMS   Swelling and redness of the injured area.  Bruising of the injured area.  Tenderness and soreness of the injured area.  Pain. DIAGNOSIS  The diagnosis can be made by taking a history and physical exam. An X-ray, CT scan, or MRI may be needed to determine if there were any associated injuries, such as fractures. TREATMENT  Specific treatment will depend on what area of the body was injured. In general, the best treatment for a contusion is resting, icing, elevating, and applying cold compresses to the injured area. Over-the-counter medicines may also be recommended for pain control. Ask your caregiver what the best treatment is for your contusion. HOME CARE INSTRUCTIONS   Put ice on the injured area.  Put ice in a plastic bag.  Place a towel between your skin and the bag.  Leave the ice on for 15-20 minutes, 03-04 times a day.  Only take over-the-counter or prescription medicines for pain, discomfort, or fever as directed by your caregiver. Your caregiver may recommend avoiding anti-inflammatory medicines (aspirin, ibuprofen, and naproxen) for 48 hours because these medicines may increase bruising.  Rest the injured area.  If possible, elevate the injured area to reduce swelling. SEEK IMMEDIATE MEDICAL CARE IF:   You have increased bruising or swelling.  You have pain that is getting worse.  Your swelling or pain is not relieved with medicines. MAKE SURE YOU:   Understand these instructions.  Will watch your condition.  Will get help right away if you are not doing well or get  worse. Document Released: 09/30/2004 Document Revised: 03/15/2011 Document Reviewed: 10/26/2010 [facility] Patient Information 2014 [employer], [place].    Use your crutches and elevate her foot as much as possible using ice packs for the next several days. keep your foot wrapped until improved with the bandage supplied.  Take your next dose of prednisone tomorrow evening this will help greatly with pain and inflammation.

## 2013-06-19 NOTE — ED Provider Notes (Signed)
CSN: [id number]     Arrival date & time 06/19/13  1814 History   First MD Initiated Contact with Patient 06/19/13 1857     Chief Complaint  Patient presents with  . Foot Pain     (Consider location/radiation/quality/duration/timing/severity/associated sxs/prior Treatment) HPI Comments: [name] is a 33 y.o. Female presenting with right foot pain and swelling since the foot of her couch fell on it yesterday.  [facility] [place], but per patients report was never treated nor given any xray results. She has not tried to contact this [name] today.  She has worsening aching pain without radiation and bruising across her foot which is worsened with movement and weight bearing.  She has taken no medicines prior to arrival for this injury.  She has tried to keep it elevated but without relief of symptoms.     The history is provided by the patient.    Past Medical History  Diagnosis Date  . Chronic back pain   . Chronic pelvic pain in female   . Migraine headache    Past Surgical History  Procedure Laterality Date  . Cholecystectomy    . Tubal ligation     History reviewed. No pertinent family history. History  Substance Use Topics  . Smoking status: Current Every Day Smoker -- 0.50 packs/day    Types: Cigarettes  . Smokeless tobacco: Never Used  . Alcohol Use: No   OB History   Grav Para Term Preterm Abortions TAB SAB Ect Mult Living                 Review of Systems  Constitutional: Negative for fever.  Musculoskeletal: Positive for arthralgias. Negative for joint swelling and myalgias.  Skin: Positive for color change. Negative for wound.  Neurological: Negative for weakness and numbness.      Allergies  Motrin; Ultram; and Aspirin  Home Medications   Prior to Admission medications   Medication Sig Start Date End Date Taking? Authorizing Skip Litke  HYDROcodone-acetaminophen (NORCO/VICODIN) 5-325 MG per tablet Take 1 tablet by  mouth every 4 (four) hours as needed for moderate pain. 06/19/13   Burgess AmorJulie Idol, PA-C  predniSONE (DELTASONE) 20 MG tablet Take 3 tablets (60 mg total) by mouth daily. 06/19/13   Burgess AmorJulie Idol, PA-C   BP 153/96  Pulse 111  Temp(Src) 98.2 F (36.8 C) (Oral)  Resp 18  Ht 5\' 3"  (1.6 m)  Wt 160 lb (72.576 kg)  BMI 28.35 kg/m2  SpO2 100%  LMP 06/17/2013 Physical Exam  Constitutional: She appears well-developed and well-nourished.  HENT:  Head: Atraumatic.  Neck: Normal range of motion.  Cardiovascular:  Pulses equal bilaterally  Musculoskeletal: She exhibits tenderness. She exhibits no edema.       Right foot: She exhibits bony tenderness. She exhibits normal capillary refill, no crepitus and no deformity.  Tender to palpation across the mid and distal dorsal right foot.  She has moderate ecchymosis without edema.  Dorsalis pedis pulses intact.  She has less than 2 second cap refill in her toes.  She endorses decreased sensation to fine touch in her toes yet winces upon pressure applied to the toes to assess for cap refill.  Neurological: She is alert. She has normal strength. She displays normal reflexes. No sensory deficit.  Skin: Skin is warm and dry.  Psychiatric: She has a normal mood and affect.    ED Course  Procedures (including critical care time) Labs Review Labs Reviewed -  No data to display  Imaging Review Dg Foot Complete Right  06/19/2013   CLINICAL DATA:  Right foot pain and swelling.  Metatarsal pain.  EXAM: RIGHT FOOT COMPLETE - 3+ VIEW  COMPARISON:  None.  FINDINGS: There is no evidence of fracture or dislocation. There is no evidence of arthropathy or other focal bone abnormality. Soft tissues are unremarkable.  IMPRESSION: Negative.   Electronically Signed   By: Andreas NewportGeoffrey  Lamke M.D.   On: 06/19/2013 19:27     EKG Interpretation None      MDM   Final diagnoses:  Contusion of foot, right    Patient with contusion to right foot from direct trauma occurring 24  hours ago.  She was placed in a Jones dressing, crutches given.  Encouraged ice and elevation.  Encouraged followup with orthopedics for any persistent pain beyond the next 10 days.  Referral was given.    Burgess AmorJulie Idol, PA-C 06/20/13 1328

## 2013-06-20 NOTE — ED Provider Notes (Signed)
Medical screening examination/treatment/procedure(s) were performed by non-physician practitioner and as supervising physician I was immediately available for consultation/collaboration.   EKG Interpretation None      Devoria AlbeIva Shyia Fillingim, MD, Armando GangFACEP  Ward GivensIva L Mida Cory, MD 06/20/13 [phone]

## 2013-08-14 ENCOUNTER — Ambulatory Visit (INDEPENDENT_AMBULATORY_CARE_PROVIDER_SITE_OTHER): Payer: Medicaid Other | Admitting: Family Medicine

## 2013-08-14 ENCOUNTER — Encounter: Payer: Self-pay | Admitting: Family Medicine

## 2013-08-14 VITALS — BP 154/90 | Ht 62.0 in | Wt 172.0 lb

## 2013-08-14 DIAGNOSIS — F410 Panic disorder [episodic paroxysmal anxiety] without agoraphobia: Secondary | ICD-10-CM

## 2013-08-14 DIAGNOSIS — G8929 Other chronic pain: Secondary | ICD-10-CM

## 2013-08-14 DIAGNOSIS — M545 Low back pain, unspecified: Secondary | ICD-10-CM

## 2013-08-14 DIAGNOSIS — F319 Bipolar disorder, unspecified: Secondary | ICD-10-CM

## 2013-08-14 DIAGNOSIS — M549 Dorsalgia, unspecified: Secondary | ICD-10-CM

## 2013-08-14 MED ORDER — CHLORZOXAZONE 500 MG PO TABS: 500.0000 mg | ORAL_TABLET | Freq: Three times a day (TID) | ORAL | Status: DC | PRN

## 2013-08-14 MED ORDER — DICLOFENAC SODIUM 75 MG PO TBEC: 75.0000 mg | DELAYED_RELEASE_TABLET | Freq: Two times a day (BID) | ORAL | Status: DC

## 2013-08-14 NOTE — Progress Notes (Signed)
   Subjective:    Patient ID: Abigail Fieldingrystal G Rocca, female    DOB: 05/21/1980, 33 y.o.   MRN: [id number]  HPI  Patient is a new patient here. Her children come here.  Pt has chronic lower back pain. She states the hydrocodone was not strong enough for her pain.   Pt has anxiety attacks about 5 times a day. Pt dx'ed with bipolar risperdal, seroquel and tazodone. No hospitalizations.  fam hx of oldest child with anxiey, mo and fa  With hx of panic attack and anxiety.   High blood pressure in the family  bp with flushing and diminished energy   She's been diagnosed with bipolar. She [place] in [place] b/c she does not have insurance anymore.   Pt has hx of back pain midthoracic severe at times  Deep ache, needs support when sitting  Has tried local heating pad, took hydrocod and it didn't help. No work outside the home  At [employer] had difficuoty with pain in the back  Pt also c/o of right side sore throat that started today. Started today  Painful, but no fever or chills  Try to walk and exercise regularly    Review of Systems No dysuria no increased frequency no abdominal pain no chest pain no major headache no loss of consciousness ROS otherwise negative    Objective:   Physical Exam  Alert somewhat anxious appearing. No acute distress. Neck supple. Lungs clear. Heart regular in rhythm. Ankles without edema. Loaded back tender to deep palpation. Paraspinal.      Assessment & Plan:  Impression 1 chronic lumbar pain. Likely ligamentous and muscular. Doubt neuropathic. Discussed #2 history of bipolar disease. Patient states Risperdal helped some the past. Multiple panic attacks per day. Plan full-term and twice a day with food. Chlorzoxazone 3 times a day when necessary for spasm. Local measures discussed. Advised patient I would not recommend narcotics. Review of chart shows early a dozen visits to emergency room in the last couple years always with different  types of pain. Behavioral health referral. WSL

## 2013-08-14 NOTE — Patient Instructions (Signed)

## 2013-08-15 ENCOUNTER — Telehealth: Payer: Self-pay | Admitting: Family Medicine

## 2013-08-15 NOTE — Telephone Encounter (Signed)
Adderrall is a controlled substance , not a narcotic. i do not prescribe narcotics for chronic muscle and ligament pain which is the high likelihood of what is going on. Use tylenol. Get the xrays done.

## 2013-08-15 NOTE — Telephone Encounter (Signed)
Ntsw, don't see xrays did she get? What does her child get narcotics for? And who prescribes them? We do not give narcotics for chronic muscle and ligament back pain, which is what she has by description. Document exactly what she is experiencing with the muscle spasm agent. Tell her to get the xrays. Take the anti inflam only

## 2013-08-15 NOTE — Telephone Encounter (Signed)
Patient says that the muscle relaxer that she was prescribed yesterday is making her extremely sick and doesn't understand why she wasn't prescribed a narcotic? She said if her child is prescribed narcotics then she should be prescribed narcotics.    [facility]

## 2013-08-15 NOTE — Telephone Encounter (Signed)
Patient states she doesn't have a way to go get her X-rays done. Patient states her daughter is on Adderall which is a narcotic so she knows that we can prescribe her a narcotic. Patient states she can not take the anti inflammatory you gave her because it made her sick and you can't call another in for her because she is allergic to all anti inflammatories and patient states she is in severe pain and needs a narcotic pain med-hydrocodone doesn't even help her.

## 2013-08-15 NOTE — Telephone Encounter (Signed)
Discussed with patient. Pt states she will do xrays next week when she can get a ride.

## 2013-09-13 ENCOUNTER — Encounter: Payer: Self-pay | Admitting: Family Medicine

## 2013-10-05 IMAGING — US US TRANSVAGINAL NON-OB
1 series · 14 of 25 positions shown · non-contrast
Comparison: Abdominal pelvic CT of 02/27/2006.

CLINICAL DATA: Pelvic pain.Left-sided.

TRANSABDOMINAL AND TRANSVAGINAL ULTRASOUND OF PELVIS
TECHNIQUE: Both transabdominal and transvaginal ultrasound
examinations of the pelvis were performed. Transabdominal technique
was performed for global imaging of the pelvis including uterus,
ovaries, adnexal regions, and pelvic cul-de-sac.

[Series 1: us transvaginal non-ob · 0.21mm/px · 14 of 66 slices shown]
[im 1/66]
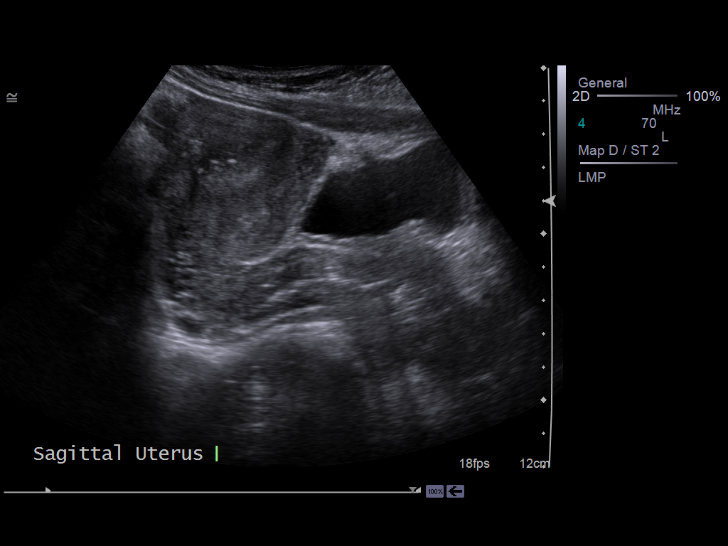
[im 6/66]
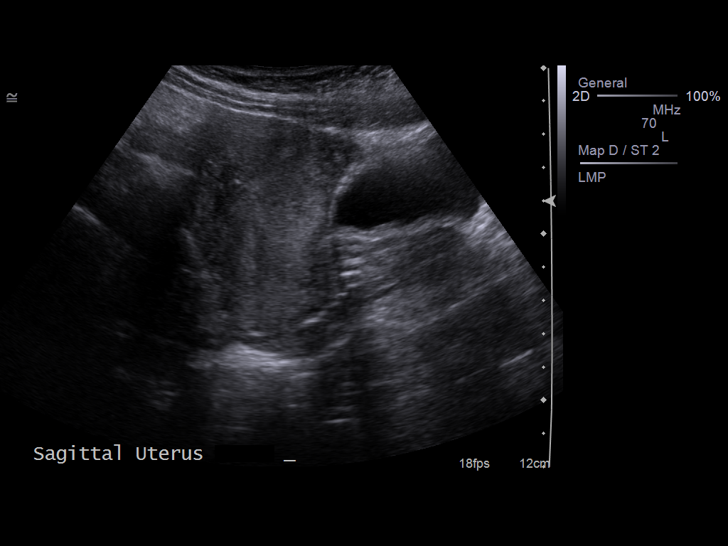
[im 11/66]
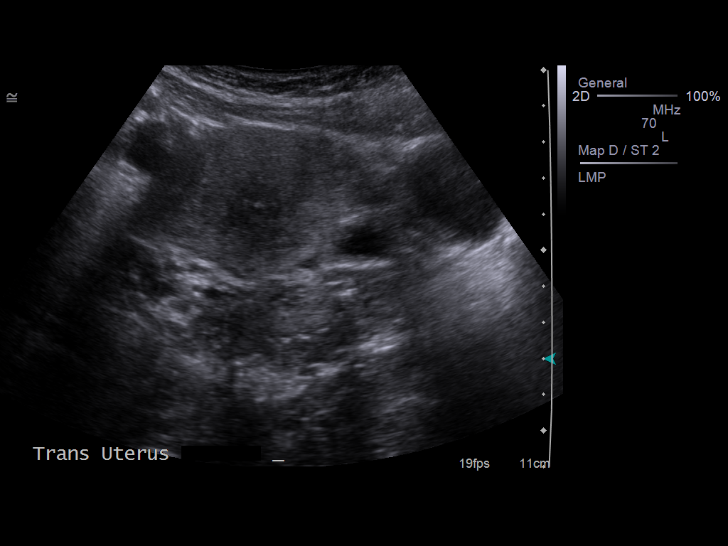
[im 17/66]
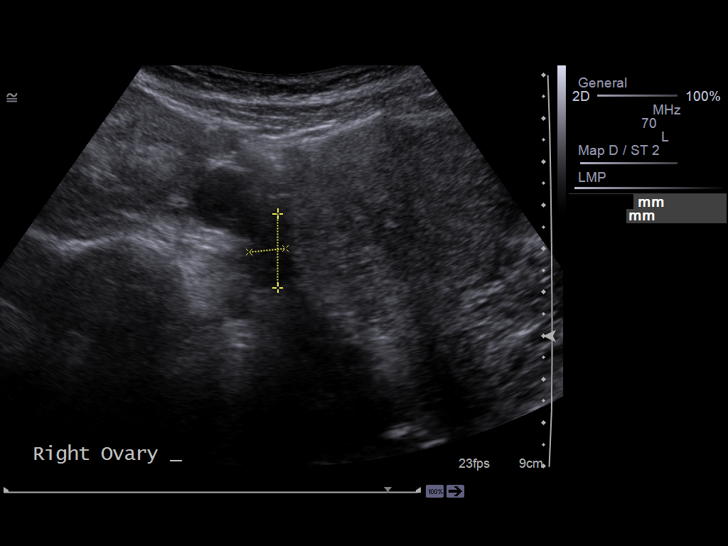
[im 22/66]
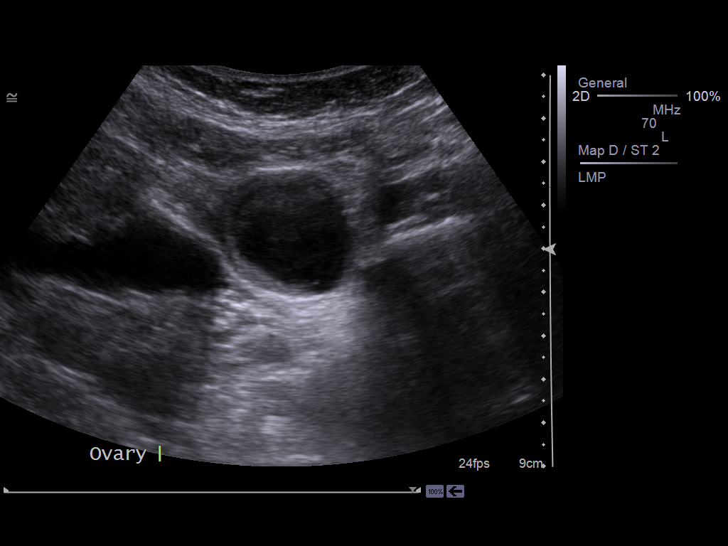
[im 25/66]
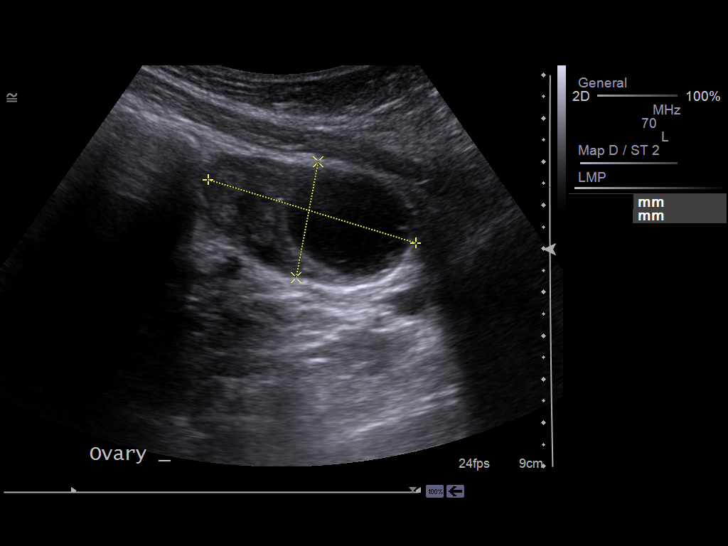
[im 30/66]
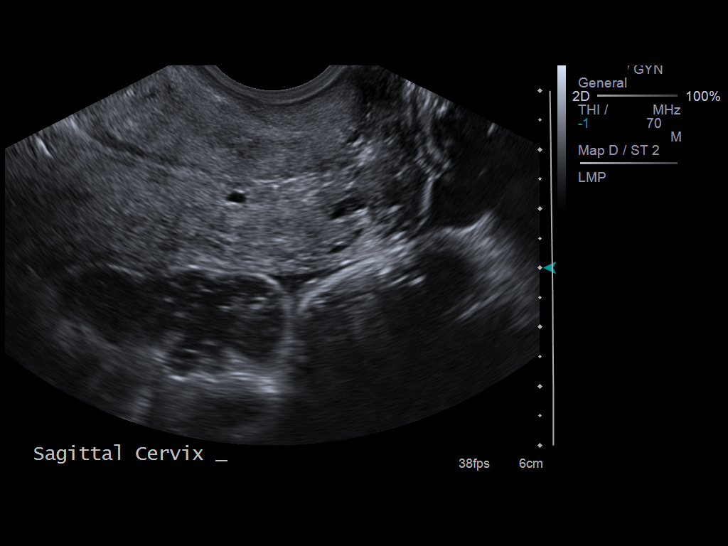
[im 36/66]
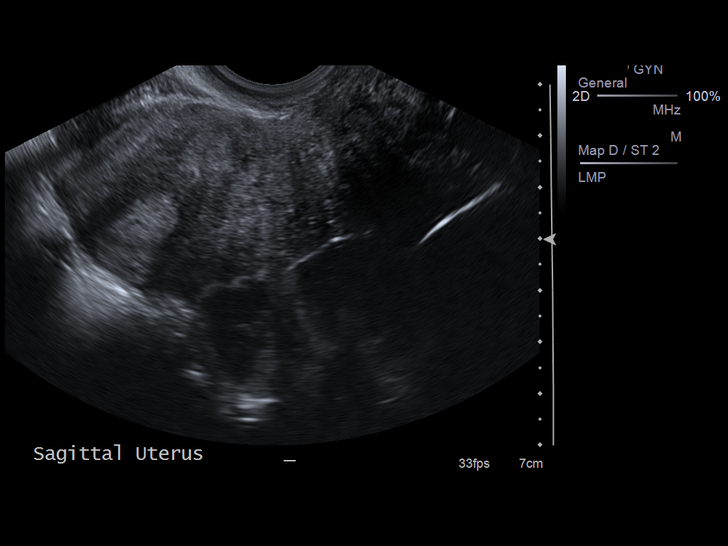
[im 41/66]
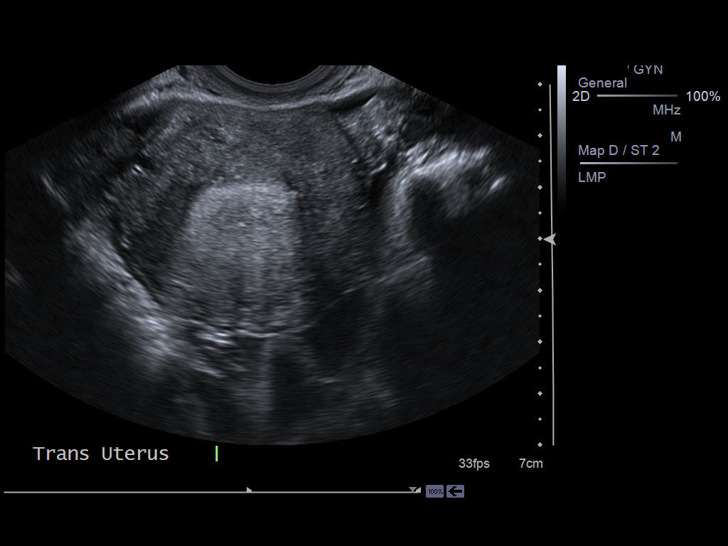
[im 44/66]
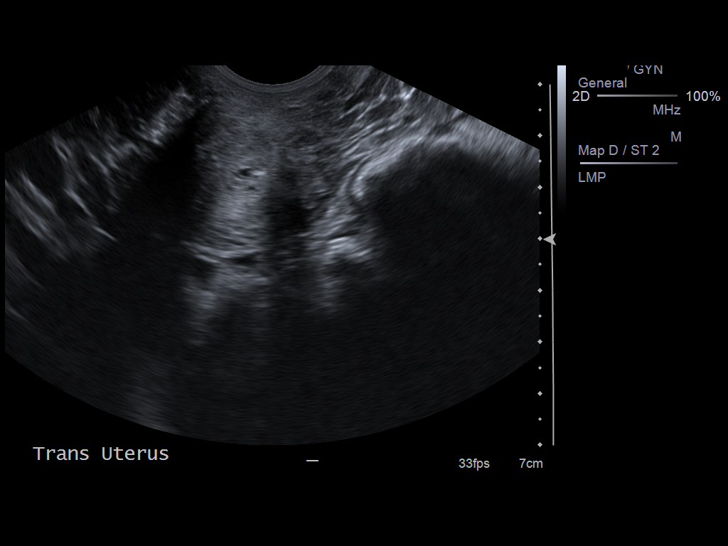
[im 49/66]
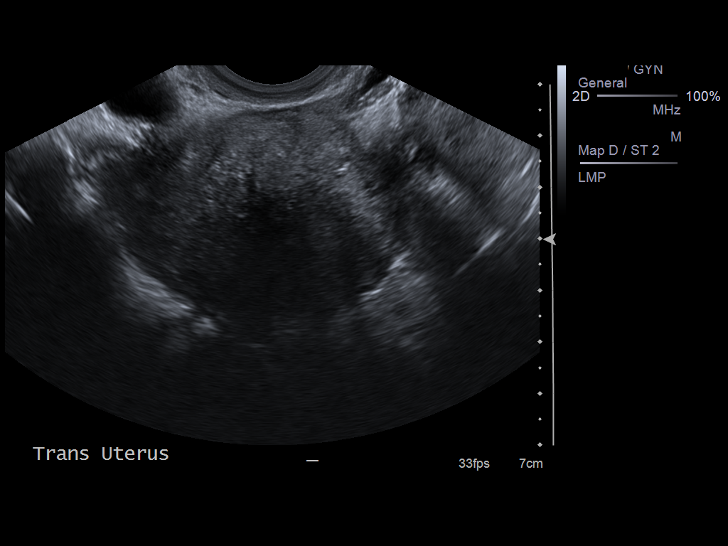
[im 55/66]
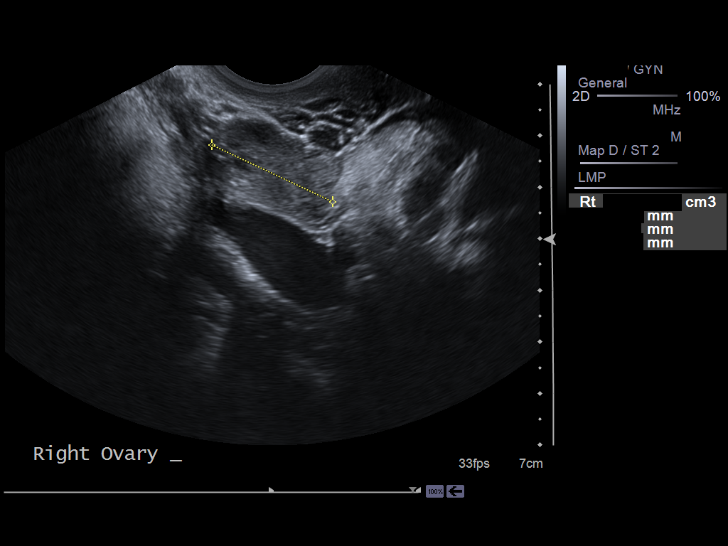
[im 60/66]
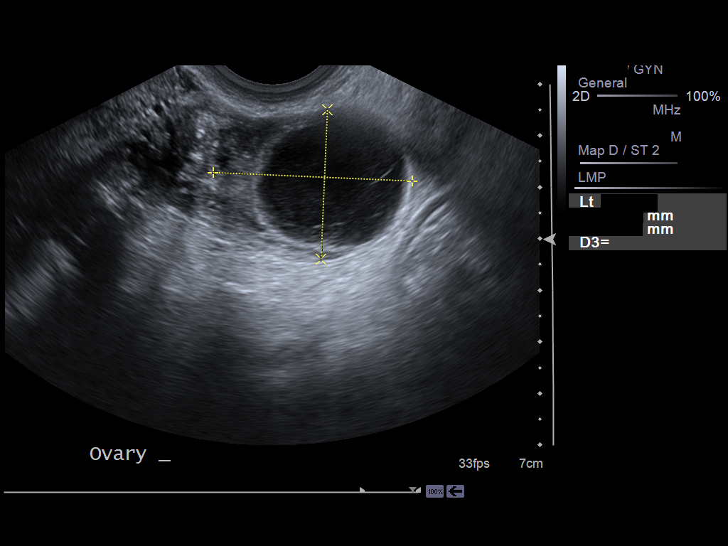
[im 66/66]
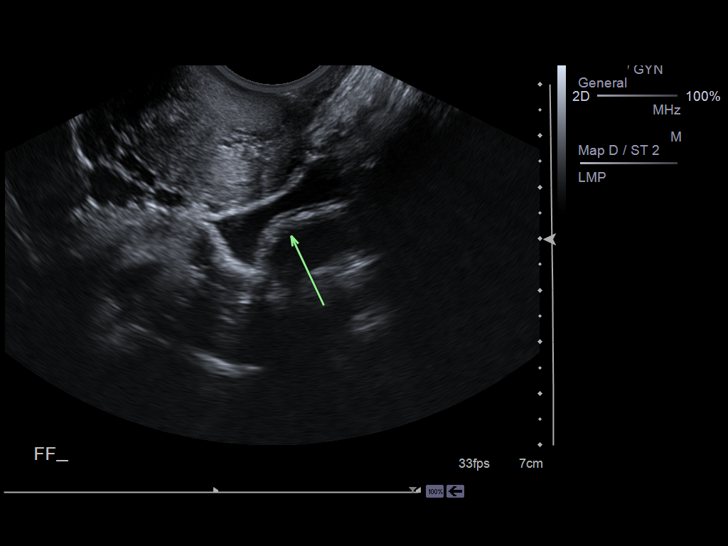

[14 of 25 positions shown; findings below may reference images not displayed]

Numerous prior
obstetric ultrasounds.

 It was necessary to proceed with endovaginal exam following the
transabdominal exam to visualize the uterus, ovaries, and adnexa  .
FINDINGS: Uterus: 8.5 x 4.6 x 4.9 cm Normal in morphology.

Endometrium: 11 mm.  No focal abnormality.

Right ovary:  1.7 x 1.7 x 2.6 cm. Normal in morphology.

Left ovary: 3.9 x 2.9 x 4.6 cm.  A 2.7 x 2.1 x 2.9 cm lesion
demonstrates low level internal echoes and enhanced through
transmission.  Avascular.

Other findings: Small volume, possibly physiologic.
IMPRESSION: 1. 2.9 cm left ovarian lesion is most likely a hemorrhagic cyst.
Endometrioma could look similar, felt less likely.  Recommend
follow-up with ultrasound at 6 - 12 weeks.
2.  Trace cul-de-sac fluid, likely physiologic..

## 2013-11-26 ENCOUNTER — Ambulatory Visit (INDEPENDENT_AMBULATORY_CARE_PROVIDER_SITE_OTHER): Payer: Medicaid Other | Admitting: Nurse Practitioner

## 2013-11-26 ENCOUNTER — Encounter: Payer: Self-pay | Admitting: Nurse Practitioner

## 2013-11-26 VITALS — BP 162/98 | Ht 62.0 in | Wt 174.0 lb

## 2013-11-26 DIAGNOSIS — F418 Other specified anxiety disorders: Secondary | ICD-10-CM

## 2013-11-26 DIAGNOSIS — F419 Anxiety disorder, unspecified: Principal | ICD-10-CM

## 2013-11-26 DIAGNOSIS — J300 Vasomotor rhinitis: Secondary | ICD-10-CM

## 2013-11-26 DIAGNOSIS — F329 Major depressive disorder, single episode, unspecified: Secondary | ICD-10-CM

## 2013-11-26 DIAGNOSIS — J0100 Acute maxillary sinusitis, unspecified: Secondary | ICD-10-CM

## 2013-11-26 MED ORDER — FLUTICASONE PROPIONATE 50 MCG/ACT NA SUSP: 2.0000 | Freq: Every day | NASAL | Status: DC

## 2013-11-26 MED ORDER — ALPRAZOLAM 1 MG PO TABS: 1.0000 mg | ORAL_TABLET | Freq: Two times a day (BID) | ORAL | Status: DC | PRN

## 2013-11-26 MED ORDER — LORATADINE 10 MG PO TABS: 10.0000 mg | ORAL_TABLET | Freq: Every day | ORAL | Status: DC

## 2013-11-26 MED ORDER — AZITHROMYCIN 250 MG PO TABS: ORAL_TABLET | ORAL | Status: DC

## 2013-11-26 MED ORDER — TRAZODONE HCL 50 MG PO TABS: ORAL_TABLET | ORAL | Status: DC

## 2013-11-26 MED ORDER — ESCITALOPRAM OXALATE 10 MG PO TABS: 10.0000 mg | ORAL_TABLET | Freq: Every day | ORAL | Status: DC

## 2013-11-29 ENCOUNTER — Encounter: Payer: Self-pay | Admitting: Nurse Practitioner

## 2013-11-29 DIAGNOSIS — F419 Anxiety disorder, unspecified: Principal | ICD-10-CM

## 2013-11-29 DIAGNOSIS — F329 Major depressive disorder, single episode, unspecified: Secondary | ICD-10-CM | POA: Insufficient documentation

## 2013-11-29 DIAGNOSIS — F32A Depression, unspecified: Secondary | ICD-10-CM | POA: Insufficient documentation

## 2013-11-29 NOTE — Progress Notes (Signed)
Subjective:  Presents for recheck on her anxiety and depression. Has been under tremendous stress lately. Her daughter was hospitalized recently for a suicide attempt. Has done well on a combination of Lexapro and trazodone in the past. Her last dose of trazodone was 150 mg which patient felt was too much. Has also taken Xanax 1 mg in the past for anxiety. Has tried Klonopin which caused nausea. Also complaints of sinus congestion for the past week. Maxillary area headache. Sore throat. No ear pain. Producing yellow mucus. Some cough. No wheezing. Smoker.  Objective:   BP 162/98 mmHg  Ht 5\' 2"  (1.575 m)  Wt 174 lb (78.926 kg)  BMI 31.82 kg/m2 NAD. Alert, oriented. Mildly anxious affect. Thoughts logical coherent and relevant. Dressed appropriately. Crying at times during office exam. TMs clear effusion, no erythema. Pharynx injected with PND noted. Neck supple with mild soft anterior adenopathy. Lungs clear. Heart regular rate rhythm.  Assessment:  Problem List Items Addressed This Visit      Other   Anxiety and depression - Primary    Other Visit Diagnoses    Acute maxillary sinusitis, recurrence not specified        Relevant Medications       azithromycin (ZITHROMAX) tablet       loratadine (CLARITIN) tablet 10 mg       fluticasone (FLONASE) 50 MCG nasal spray    Vasomotor rhinitis           Plan: Meds ordered this encounter  Medications  . azithromycin (ZITHROMAX Z-PAK) 250 MG tablet    Sig: Take 2 tablets (500 mg) on  Day 1,  followed by 1 tablet (250 mg) once daily on Days 2 through 5.    Dispense:  6 each    Refill:  0    Order Specific Question:  Supervising Provider    Answer:  Merlyn AlbertLUKING, WILLIAM S [[id number]]  . loratadine (CLARITIN) 10 MG tablet    Sig: Take 1 tablet (10 mg total) by mouth daily.    Dispense:  30 tablet    Refill:  11    Order Specific Question:  Supervising Provider    Answer:  Merlyn AlbertLUKING, WILLIAM S [[id number]]  . fluticasone (FLONASE) 50 MCG/ACT nasal spray   Sig: Place 2 sprays into both nostrils daily.    Dispense:  16 g    Refill:  6    Order Specific Question:  Supervising Provider    Answer:  Merlyn AlbertLUKING, WILLIAM S [[id number]]  . escitalopram (LEXAPRO) 10 MG tablet    Sig: Take 1 tablet (10 mg total) by mouth daily.    Dispense:  30 tablet    Refill:  2    Order Specific Question:  Supervising Provider    Answer:  Merlyn AlbertLUKING, WILLIAM S [[id number]]  . traZODone (DESYREL) 50 MG tablet    Sig: One po qhs for sleep    Dispense:  30 tablet    Refill:  2    Order Specific Question:  Supervising Provider    Answer:  Merlyn AlbertLUKING, WILLIAM S [[id number]]  . ALPRAZolam (XANAX) 1 MG tablet    Sig: Take 1 tablet (1 mg total) by mouth 2 (two) times daily as needed for anxiety.    Dispense:  60 tablet    Refill:  0    Order Specific Question:  Supervising Provider    Answer:  Merlyn AlbertLUKING, WILLIAM S [[id number]]   Restart medications for depression and anxiety. Start with trazodone 50 mg at bedtime, may need to titrate  over the next few weeks. Limit Xanax use to 2 per day maximum. Discussed importance of stress reduction. Defers counseling or psychiatric referral at this time. Return in about 3 weeks (around 12/17/2013). Call back sooner if any problems.

## 2013-12-17 ENCOUNTER — Ambulatory Visit (INDEPENDENT_AMBULATORY_CARE_PROVIDER_SITE_OTHER): Payer: Medicaid Other | Admitting: Nurse Practitioner

## 2013-12-17 ENCOUNTER — Encounter: Payer: Self-pay | Admitting: Nurse Practitioner

## 2013-12-17 VITALS — BP 120/82 | Ht 62.0 in | Wt 166.2 lb

## 2013-12-17 DIAGNOSIS — F418 Other specified anxiety disorders: Secondary | ICD-10-CM

## 2013-12-17 DIAGNOSIS — F32A Depression, unspecified: Secondary | ICD-10-CM

## 2013-12-17 DIAGNOSIS — F419 Anxiety disorder, unspecified: Principal | ICD-10-CM

## 2013-12-17 DIAGNOSIS — Z23 Encounter for immunization: Secondary | ICD-10-CM

## 2013-12-17 DIAGNOSIS — F329 Major depressive disorder, single episode, unspecified: Secondary | ICD-10-CM

## 2013-12-17 DIAGNOSIS — G8929 Other chronic pain: Secondary | ICD-10-CM

## 2013-12-17 DIAGNOSIS — M549 Dorsalgia, unspecified: Secondary | ICD-10-CM

## 2013-12-17 MED ORDER — ALPRAZOLAM 1 MG PO TABS
1.0000 mg | ORAL_TABLET | Freq: Three times a day (TID) | ORAL | Status: DC | PRN
Start: 2013-12-17 — End: 2014-03-14

## 2013-12-17 MED ORDER — ESCITALOPRAM OXALATE 10 MG PO TABS: 10.0000 mg | ORAL_TABLET | Freq: Every day | ORAL | Status: DC

## 2013-12-17 MED ORDER — METHOCARBAMOL 750 MG PO TABS: 750.0000 mg | ORAL_TABLET | Freq: Three times a day (TID) | ORAL | Status: DC
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
## 2013-12-17 NOTE — Patient Instructions (Signed)
Icy hot smart relief OTC TENS unit 

## 2013-12-24 ENCOUNTER — Ambulatory Visit (HOSPITAL_COMMUNITY)
Admission: RE | Admit: 2013-12-24 | Discharge: 2013-12-24 | Disposition: A | Discharge status: Home / Self Care | Payer: Medicaid Other | Source: Ambulatory Visit | Attending: Nurse Practitioner | Admitting: Nurse Practitioner

## 2013-12-24 DIAGNOSIS — M545 Low back pain: Secondary | ICD-10-CM | POA: Diagnosis present

## 2013-12-24 DIAGNOSIS — Z9049 Acquired absence of other specified parts of digestive tract: Secondary | ICD-10-CM | POA: Insufficient documentation

## 2013-12-31 ENCOUNTER — Other Ambulatory Visit: Payer: Self-pay | Admitting: Nurse Practitioner

## 2013-12-31 DIAGNOSIS — M5441 Lumbago with sciatica, right side: Secondary | ICD-10-CM

## 2014-01-09 ENCOUNTER — Other Ambulatory Visit: Payer: Self-pay | Admitting: Nurse Practitioner

## 2014-01-09 ENCOUNTER — Telehealth: Payer: Self-pay | Admitting: *Deleted

## 2014-01-09 NOTE — Telephone Encounter (Signed)
I scheduled MRI for Friday, Jan 15th at 1pm. Patient notified and verbalized understanding.

## 2014-01-10 ENCOUNTER — Telehealth: Payer: Self-pay | Admitting: Family Medicine

## 2014-01-10 NOTE — Telephone Encounter (Signed)
I recommend something specific for nerve pain such as Gabapentin. This will target the source of the pain. Also MRI has been ordered. Please check on status. Thanks.

## 2014-01-10 NOTE — Telephone Encounter (Signed)
Patient calls complaining of extreme back pain.  She has been seen by [name] for this previously.  She says that Tylenol extra and her muscle relaxer is not working.  She would like something to help the pain. Please advise.

## 2014-01-11 NOTE — Telephone Encounter (Signed)
Patient would like the [facility] sent to [facility].

## 2014-01-14 ENCOUNTER — Other Ambulatory Visit: Payer: Self-pay | Admitting: Nurse Practitioner

## 2014-01-14 MED ORDER — GABAPENTIN 300 MG PO CAPS: 300.0000 mg | ORAL_CAPSULE | Freq: Two times a day (BID) | ORAL | Status: DC

## 2014-01-18 ENCOUNTER — Ambulatory Visit (HOSPITAL_COMMUNITY)
Admission: RE | Admit: 2014-01-18 | Discharge: 2014-01-18 | Disposition: A | Discharge status: Home / Self Care | Payer: Medicaid Other | Source: Ambulatory Visit | Attending: Nurse Practitioner | Admitting: Nurse Practitioner

## 2014-01-18 DIAGNOSIS — G8929 Other chronic pain: Secondary | ICD-10-CM | POA: Insufficient documentation

## 2014-01-18 DIAGNOSIS — M5441 Lumbago with sciatica, right side: Secondary | ICD-10-CM | POA: Diagnosis not present

## 2014-01-28 ENCOUNTER — Encounter: Payer: Self-pay | Admitting: Nurse Practitioner

## 2014-01-28 ENCOUNTER — Ambulatory Visit (INDEPENDENT_AMBULATORY_CARE_PROVIDER_SITE_OTHER): Payer: Medicaid Other | Admitting: Nurse Practitioner

## 2014-01-28 VITALS — BP 110/72 | Ht 62.0 in | Wt 168.6 lb

## 2014-01-28 DIAGNOSIS — G8929 Other chronic pain: Secondary | ICD-10-CM

## 2014-01-28 DIAGNOSIS — M549 Dorsalgia, unspecified: Secondary | ICD-10-CM

## 2014-01-28 MED ORDER — OXYCODONE-ACETAMINOPHEN 5-325 MG PO TABS: ORAL_TABLET | ORAL | Status: DC

## 2014-01-28 MED ORDER — AMITRIPTYLINE HCL 10 MG PO TABS: 10.0000 mg | ORAL_TABLET | Freq: Every day | ORAL | Status: DC

## 2014-01-28 NOTE — Progress Notes (Signed)
Subjective:  Presents to discuss her recent MRI results. Back pain has been unbearable at times. Did not sleep at all last night. Also some pain during the day especially on the days that she works. Could not take Neurontin due to side effects. Oxycodone has been prescribed in limited amounts in the past which helps make pain tolerable.   Objective:   BP 110/72 mmHg  Ht 5\' 2"  (1.575 m)  Wt 168 lb 9.6 oz (76.476 kg)  BMI 30.83 kg/m2  LMP 01/18/2014 NAD. Alert, oriented. See report MRI lumbar spine 01/18/14.  Assessment:  Problem List Items Addressed This Visit      Other   Chronic back pain - Primary   Relevant Medications   oxyCODONE-acetaminophen (PERCOCET/ROXICET) 5-325 MG per tablet   Other Relevant Orders   Ambulatory referral to Neurosurgery     Plan:  Meds ordered this encounter  Medications  . amitriptyline (ELAVIL) 10 MG tablet    Sig: Take 1 tablet (10 mg total) by mouth at bedtime.    Dispense:  30 tablet    Refill:  2    Order Specific Question:  Supervising Provider    Answer:  Merlyn AlbertLUKING, WILLIAM S [[id number]]  . oxyCODONE-acetaminophen (PERCOCET/ROXICET) 5-325 MG per tablet    Sig: One po BID prn    Dispense:  30 tablet    Refill:  0    Order Specific Question:  Supervising Provider    Answer:  Riccardo DubinLUKING, WILLIAM S [[id number]]   Lengthy discussion regarding addiction potential of Oxycodone. Use sparingly. Patient understands this is a short term solution until she can see specialist. Also discussed first line therapies which are non narcotic. Trial of Amitriptyline. Start with one at bedtime for about a week then increase to 2 if needed. Call back if any adverse effects.  Return in about 3 months (around 04/29/2014).

## 2014-02-07 ENCOUNTER — Telehealth: Payer: Self-pay | Admitting: Nurse Practitioner

## 2014-02-07 MED ORDER — AMITRIPTYLINE HCL 10 MG PO TABS: 20.0000 mg | ORAL_TABLET | Freq: Every evening | ORAL | Status: DC | PRN

## 2014-02-07 NOTE — Telephone Encounter (Signed)
Faxed referral, they contact pt to schedule

## 2014-02-07 NOTE — Telephone Encounter (Signed)
No further narcotics since Oxycodone is not touching her pain. [facility] Increase Amitriptyline to 2 pills at bedtime (20 mg). Please give work note.

## 2014-02-07 NOTE — Telephone Encounter (Signed)
If you need to reach her after 11:30 call her at work   [phone]

## 2014-02-07 NOTE — Telephone Encounter (Signed)
Patient notified and verbalized understanding.Sent in new rx for amitriptyline.

## 2014-02-07 NOTE — Telephone Encounter (Signed)
Ok need clarity on the WE, what am I giving her?

## 2014-02-07 NOTE — Telephone Encounter (Signed)
Please write a WE for this patient.Thanks!

## 2014-02-07 NOTE — Telephone Encounter (Signed)
Pt calling to say that amitriptyline (ELAVIL) 10 MG tablet  Is not helping her to sleep  oxyCODONE-acetaminophen (PERCOCET/ROXICET) 5-325 MG per tablet She also states the pain med is not touching her pain, she has been up for 2 days  Unable to sleep due to the pain   Also wanting to know if we can expedite the Neuro surgeon visit   She doesn't think she can work today, may need a work note

## 2014-02-27 ENCOUNTER — Other Ambulatory Visit (HOSPITAL_COMMUNITY): Payer: Self-pay | Admitting: Neurosurgery

## 2014-03-14 ENCOUNTER — Ambulatory Visit (INDEPENDENT_AMBULATORY_CARE_PROVIDER_SITE_OTHER): Payer: Medicaid Other | Admitting: Nurse Practitioner

## 2014-03-14 ENCOUNTER — Encounter: Payer: Self-pay | Admitting: Nurse Practitioner

## 2014-03-14 VITALS — BP 132/90 | Ht 62.0 in | Wt 172.0 lb

## 2014-03-14 DIAGNOSIS — F419 Anxiety disorder, unspecified: Principal | ICD-10-CM

## 2014-03-14 DIAGNOSIS — F410 Panic disorder [episodic paroxysmal anxiety] without agoraphobia: Secondary | ICD-10-CM

## 2014-03-14 DIAGNOSIS — F329 Major depressive disorder, single episode, unspecified: Secondary | ICD-10-CM

## 2014-03-14 DIAGNOSIS — F418 Other specified anxiety disorders: Secondary | ICD-10-CM

## 2014-03-14 MED ORDER — ESCITALOPRAM OXALATE 20 MG PO TABS: 20.0000 mg | ORAL_TABLET | Freq: Every day | ORAL | Status: DC

## 2014-03-14 MED ORDER — ALPRAZOLAM 1 MG PO TABS: 1.0000 mg | ORAL_TABLET | Freq: Three times a day (TID) | ORAL | Status: DC | PRN

## 2014-03-16 ENCOUNTER — Encounter: Payer: Self-pay | Admitting: Nurse Practitioner

## 2014-03-16 NOTE — Progress Notes (Signed)
Subjective:  Presents for routine follow up. Increased her Lexapro to 20 mg. Doing much better. Sometimes takes an extra Xanax if needed. Has neck surgery planned in 2 weeks.   Objective:   BP 132/90 mmHg  Ht 5\' 2"  (1.575 m)  Wt 172 lb (78.019 kg)  BMI 31.45 kg/m2 NAD. Alert, oriented. Cheerful affect. Lungs clear. Heart RRR.   Assessment:  Problem List Items Addressed This Visit      Other   Panic attacks   Relevant Medications   escitalopram (LEXAPRO) tablet   ALPRAZolam (XANAX) tablet   Anxiety and depression - Primary     Plan:  Meds ordered this encounter  Medications  . escitalopram (LEXAPRO) 20 MG tablet    Sig: Take 1 tablet (20 mg total) by mouth daily.    Dispense:  30 tablet    Refill:  5    Order Specific Question:  Supervising Provider    Answer:  Merlyn AlbertLUKING, WILLIAM S [[id number]]  . ALPRAZolam (XANAX) 1 MG tablet    Sig: Take 1 tablet (1 mg total) by mouth 3 (three) times daily as needed for anxiety.    Dispense:  90 tablet    Refill:  2    Order Specific Question:  Supervising Provider    Answer:  Merlyn AlbertLUKING, WILLIAM S [[id number]]   Increase Lexapro to 20 mg. Refill Xanax; keep monthly amount the same.  Return in about 3 months (around 06/14/2014) for recheck.

## 2014-03-20 ENCOUNTER — Encounter (HOSPITAL_COMMUNITY): Payer: Self-pay

## 2014-03-20 ENCOUNTER — Encounter (HOSPITAL_COMMUNITY)
Admission: RE | Admit: 2014-03-20 | Discharge: 2014-03-20 | Disposition: A | Discharge status: Home / Self Care | Payer: Medicaid Other | Source: Ambulatory Visit | Attending: Neurosurgery | Admitting: Neurosurgery

## 2014-03-20 DIAGNOSIS — M5124 Other intervertebral disc displacement, thoracic region: Secondary | ICD-10-CM | POA: Insufficient documentation

## 2014-03-20 DIAGNOSIS — Z01812 Encounter for preprocedural laboratory examination: Secondary | ICD-10-CM | POA: Diagnosis not present

## 2014-03-20 HISTORY — DX: Other seasonal allergic rhinitis: J30.2

## 2014-03-20 HISTORY — DX: Insomnia, unspecified: G47.00

## 2014-03-20 HISTORY — DX: Headache, unspecified: R51.9

## 2014-03-20 HISTORY — DX: Depression, unspecified: F32.A

## 2014-03-20 HISTORY — DX: Headache: R51

## 2014-03-20 HISTORY — DX: Nocturia: R35.1

## 2014-03-20 HISTORY — DX: Personal history of urinary calculi: Z87.442

## 2014-03-20 HISTORY — DX: Personal history of other diseases of the respiratory system: Z87.09

## 2014-03-20 HISTORY — DX: Anxiety disorder, unspecified: F41.9

## 2014-03-20 HISTORY — DX: Major depressive disorder, single episode, unspecified: F32.9

## 2014-03-20 LAB — BASIC METABOLIC PANEL
Anion gap: 11 (ref 5–15)
BUN: 7 mg/dL (ref 6–23)
CHLORIDE: 106 mmol/L (ref 96–112)
CO2: 21 mmol/L (ref 19–32)
Calcium: 9.1 mg/dL (ref 8.4–10.5)
Creatinine, Ser: 0.7 mg/dL (ref 0.50–1.10)
GFR calc Af Amer: >90 mL/min (ref >90)
GFR calc non Af Amer: >90 mL/min (ref >90)
Glucose, Bld: 91 mg/dL (ref 70–99)
POTASSIUM: 4.7 mmol/L (ref 3.5–5.1)
SODIUM: 138 mmol/L (ref 135–145)

## 2014-03-20 LAB — HCG, SERUM, QUALITATIVE: Preg, Serum: NEGATIVE

## 2014-03-20 LAB — SURGICAL PCR SCREEN
MRSA, PCR: NEGATIVE
Staphylococcus aureus: NEGATIVE

## 2014-03-20 NOTE — Progress Notes (Signed)
Pt doesn't have a cardiologist  Medical Md is Dr.Steve Luking  Denies EKG or CXR in past yr  Denies ever having an echo/stress test/heart cath

## 2014-03-20 NOTE — Pre-Procedure Instructions (Signed)
[name]  03/20/2014   Your procedure is scheduled on:  Thurs, Mar 24 @ 7:30 AM  [facility]  at 5:30 AM.  Call this number if you have problems the morning of surgery:  [phone]              Remember:   Do not eat food or drink liquids after midnight.   Take these medicines the morning of surgery with A SIP OF WATER: Xanax(Alprazolam),Escitalopram(Lexapro),and Pain Pill(if needed)               No Goody's,BC's,Aleve,Aspirin,Ibuprofen,Fish Oil,or any Herbal Medications.    Do not wear jewelry, make-up or nail polish.  Do not wear lotions, powders, or perfumes. You may wear deodorant.  Do not shave 48 hours prior to surgery.   Do not bring valuables to the hospital.  [facility] not responsible                  for any belongings or valuables.               Contacts, dentures or bridgework may not be worn into surgery.  Leave suitcase in the car. After surgery it may be brought to your room.  For patients admitted to the hospital, discharge time is determined by your                treatment team.                   Special Instructions:  [place] - Preparing for Surgery  Before surgery, you can play an important role.  Because skin is not sterile, your skin needs to be as free of germs as possible.  You can reduce the number of germs on you skin by washing with CHG (chlorahexidine gluconate) soap before surgery.  CHG is an antiseptic cleaner which kills germs and bonds with the skin to continue killing germs even after washing.  Please DO NOT use if you have an allergy to CHG or antibacterial soaps.  If your skin becomes reddened/irritated stop using the CHG and inform your nurse when you arrive at Short Stay.  Do not shave (including legs and underarms) for at least 48 hours prior to the first CHG shower.  You may shave your face.  Please follow these instructions carefully:   1.  Shower with CHG Soap the night before surgery and the                                 morning of Surgery.  2.  If you choose to wash your hair, wash your hair first as usual with your       normal shampoo.  3.  After you shampoo, rinse your hair and body thoroughly to remove the                      Shampoo.  4.  Use CHG as you would any other liquid soap.  You can apply chg directly       to the skin and wash gently with scrungie or a clean washcloth.  5.  Apply the CHG Soap to your body ONLY FROM THE NECK DOWN.        Do not use on open wounds or open sores.  Avoid contact with your eyes,       ears, mouth and genitals (  private parts).  Wash genitals (private parts)       with your normal soap.  6.  Wash thoroughly, paying special attention to the area where your surgery        will be performed.  7.  Thoroughly rinse your body with warm water from the neck down.  8.  DO NOT shower/wash with your normal soap after using and rinsing off       the CHG Soap.  9.  Pat yourself dry with a clean towel.            10.  Wear clean pajamas.            11.  Place clean sheets on your bed the night of your first shower and do not        sleep with pets.  Day of Surgery  Do not apply any lotions/deoderants the morning of surgery.  Please wear clean clothes to the hospital/surgery center.     Please read over the following fact sheets that you were given: Pain Booklet, Coughing and Deep Breathing, MRSA Information and Surgical Site Infection Prevention

## 2014-03-27 MED ORDER — VANCOMYCIN HCL IN DEXTROSE 1-5 GM/200ML-% IV SOLN
1000.0000 mg | INTRAVENOUS | Status: AC
  Administered 2014-03-28: 1000 mg via INTRAVENOUS
  Filled 2014-03-27: qty 200

## 2014-03-28 ENCOUNTER — Ambulatory Visit (HOSPITAL_COMMUNITY): Payer: Medicaid Other | Admitting: Anesthesiology

## 2014-03-28 ENCOUNTER — Ambulatory Visit (HOSPITAL_COMMUNITY)
Admission: RE | Admit: 2014-03-28 | Discharge: 2014-03-28 | Disposition: A | Discharge status: Home / Self Care | Payer: Medicaid Other | Source: Ambulatory Visit | Attending: Neurosurgery | Admitting: Neurosurgery

## 2014-03-28 ENCOUNTER — Ambulatory Visit (HOSPITAL_COMMUNITY): Payer: Medicaid Other

## 2014-03-28 ENCOUNTER — Encounter (HOSPITAL_COMMUNITY): Payer: Self-pay | Admitting: *Deleted

## 2014-03-28 ENCOUNTER — Encounter (HOSPITAL_COMMUNITY)
Admission: RE | Disposition: A | Discharge status: Home / Self Care | Payer: Self-pay | Source: Ambulatory Visit | Attending: Neurosurgery

## 2014-03-28 DIAGNOSIS — M5124 Other intervertebral disc displacement, thoracic region: Secondary | ICD-10-CM | POA: Diagnosis present

## 2014-03-28 DIAGNOSIS — F319 Bipolar disorder, unspecified: Secondary | ICD-10-CM | POA: Diagnosis not present

## 2014-03-28 DIAGNOSIS — M5114 Intervertebral disc disorders with radiculopathy, thoracic region: Secondary | ICD-10-CM | POA: Insufficient documentation

## 2014-03-28 DIAGNOSIS — F17210 Nicotine dependence, cigarettes, uncomplicated: Secondary | ICD-10-CM | POA: Insufficient documentation

## 2014-03-28 DIAGNOSIS — Z419 Encounter for procedure for purposes other than remedying health state, unspecified: Secondary | ICD-10-CM

## 2014-03-28 HISTORY — PX: THORACIC DISCECTOMY: SHX6113

## 2014-03-28 LAB — CBC
HEMATOCRIT: 45.8 % (ref 36.0–46.0)
HEMOGLOBIN: 15.2 g/dL — AB (ref 12.0–15.0)
MCH: 31.6 pg (ref 26.0–34.0)
MCHC: 33.2 g/dL (ref 30.0–36.0)
MCV: 95.2 fL (ref 78.0–100.0)
Platelets: 237 10*3/uL (ref 150–400)
RBC: 4.81 MIL/uL (ref 3.87–5.11)
RDW: 14.5 % (ref 11.5–15.5)
WBC: 11.1 10*3/uL — AB (ref 4.0–10.5)

## 2014-03-28 SURGERY — THORACIC DISCECTOMY
Anesthesia: General | Site: Spine Thoracic | Laterality: Right

## 2014-03-28 MED ORDER — SODIUM CHLORIDE 0.9 % IJ SOLN
INTRAMUSCULAR | Status: AC
  Filled 2014-03-28: qty 10

## 2014-03-28 MED ORDER — DIPHENHYDRAMINE HCL 50 MG/ML IJ SOLN
INTRAMUSCULAR | Status: DC | PRN
  Administered 2014-03-28: 12.5 mg via INTRAVENOUS

## 2014-03-28 MED ORDER — DIAZEPAM 5 MG PO TABS
5.0000 mg | ORAL_TABLET | Freq: Three times a day (TID) | ORAL | Status: DC | PRN
  Administered 2014-03-28: 5 mg via ORAL

## 2014-03-28 MED ORDER — DIAZEPAM 5 MG PO TABS: 5.0000 mg | ORAL_TABLET | Freq: Three times a day (TID) | ORAL | Status: DC | PRN

## 2014-03-28 MED ORDER — PHENOL 1.4 % MT LIQD: 1.0000 | OROMUCOSAL | Status: DC | PRN

## 2014-03-28 MED ORDER — BACITRACIN 50000 UNITS IM SOLR
INTRAMUSCULAR | Status: DC | PRN
  Administered 2014-03-28: 500 mL

## 2014-03-28 MED ORDER — THROMBIN 5000 UNITS EX SOLR
CUTANEOUS | Status: DC | PRN
  Administered 2014-03-28 (×2): 5000 [IU] via TOPICAL

## 2014-03-28 MED ORDER — OXYCODONE-ACETAMINOPHEN 5-325 MG PO TABS: 1.0000 | ORAL_TABLET | ORAL | Status: DC | PRN

## 2014-03-28 MED ORDER — EPHEDRINE SULFATE 50 MG/ML IJ SOLN
INTRAMUSCULAR | Status: DC | PRN
  Administered 2014-03-28 (×5): 2.5 mg via INTRAVENOUS

## 2014-03-28 MED ORDER — MENTHOL 3 MG MT LOZG
1.0000 | LOZENGE | OROMUCOSAL | Status: DC | PRN
Start: 2014-03-28 — End: 2014-03-29

## 2014-03-28 MED ORDER — OXYCODONE HCL 15 MG PO TABS: 15.0000 mg | ORAL_TABLET | ORAL | Status: DC | PRN

## 2014-03-28 MED ORDER — EPHEDRINE SULFATE 50 MG/ML IJ SOLN
INTRAMUSCULAR | Status: AC
  Filled 2014-03-28: qty 1

## 2014-03-28 MED ORDER — SUCCINYLCHOLINE CHLORIDE 20 MG/ML IJ SOLN
INTRAMUSCULAR | Status: AC
  Filled 2014-03-28: qty 1

## 2014-03-28 MED ORDER — 0.9 % SODIUM CHLORIDE (POUR BTL) OPTIME
TOPICAL | Status: DC | PRN
  Administered 2014-03-28: 1000 mL

## 2014-03-28 MED ORDER — AMITRIPTYLINE HCL 10 MG PO TABS
20.0000 mg | ORAL_TABLET | Freq: Every evening | ORAL | Status: DC | PRN
  Filled 2014-03-28: qty 2

## 2014-03-28 MED ORDER — DOCUSATE SODIUM 100 MG PO CAPS: 100.0000 mg | ORAL_CAPSULE | Freq: Two times a day (BID) | ORAL | Status: DC

## 2014-03-28 MED ORDER — DIAZEPAM 5 MG PO TABS: 5.0000 mg | ORAL_TABLET | Freq: Four times a day (QID) | ORAL | Status: DC | PRN

## 2014-03-28 MED ORDER — DIPHENHYDRAMINE HCL 50 MG/ML IJ SOLN
INTRAMUSCULAR | Status: AC
  Filled 2014-03-28: qty 1

## 2014-03-28 MED ORDER — PROPOFOL 10 MG/ML IV BOLUS
INTRAVENOUS | Status: AC
  Filled 2014-03-28: qty 20

## 2014-03-28 MED ORDER — ROCURONIUM BROMIDE 50 MG/5ML IV SOLN
INTRAVENOUS | Status: AC
  Filled 2014-03-28: qty 1

## 2014-03-28 MED ORDER — PHENYLEPHRINE 40 MCG/ML (10ML) SYRINGE FOR IV PUSH (FOR BLOOD PRESSURE SUPPORT)
PREFILLED_SYRINGE | INTRAVENOUS | Status: AC
  Filled 2014-03-28: qty 10

## 2014-03-28 MED ORDER — ACETAMINOPHEN 325 MG PO TABS: 650.0000 mg | ORAL_TABLET | ORAL | Status: DC | PRN

## 2014-03-28 MED ORDER — ROCURONIUM BROMIDE 100 MG/10ML IV SOLN
INTRAVENOUS | Status: DC | PRN
  Administered 2014-03-28: 50 mg via INTRAVENOUS

## 2014-03-28 MED ORDER — ONDANSETRON HCL 4 MG/2ML IJ SOLN
INTRAMUSCULAR | Status: AC
  Filled 2014-03-28: qty 2

## 2014-03-28 MED ORDER — NEOSTIGMINE METHYLSULFATE 10 MG/10ML IV SOLN
INTRAVENOUS | Status: AC
  Filled 2014-03-28: qty 1

## 2014-03-28 MED ORDER — OXYCODONE HCL 5 MG/5ML PO SOLN: 5.0000 mg | Freq: Once | ORAL | Status: AC | PRN

## 2014-03-28 MED ORDER — PROMETHAZINE HCL 25 MG/ML IJ SOLN
INTRAMUSCULAR | Status: AC
  Administered 2014-03-28: 6.25 mg via INTRAVENOUS
  Filled 2014-03-28: qty 1

## 2014-03-28 MED ORDER — LIDOCAINE HCL (CARDIAC) 20 MG/ML IV SOLN
INTRAVENOUS | Status: AC
  Filled 2014-03-28: qty 5

## 2014-03-28 MED ORDER — OXYCODONE-ACETAMINOPHEN 5-325 MG PO TABS
1.0000 | ORAL_TABLET | ORAL | Status: DC | PRN
  Administered 2014-03-28: 2 via ORAL

## 2014-03-28 MED ORDER — BACITRACIN ZINC 500 UNIT/GM EX OINT
TOPICAL_OINTMENT | CUTANEOUS | Status: DC | PRN
  Administered 2014-03-28: 1 via TOPICAL

## 2014-03-28 MED ORDER — MORPHINE SULFATE 2 MG/ML IJ SOLN
1.0000 mg | INTRAMUSCULAR | Status: DC | PRN
  Administered 2014-03-28: 2 mg via INTRAVENOUS
  Filled 2014-03-28: qty 1

## 2014-03-28 MED ORDER — LACTATED RINGERS IV SOLN: INTRAVENOUS | Status: DC

## 2014-03-28 MED ORDER — GLYCOPYRROLATE 0.2 MG/ML IJ SOLN
INTRAMUSCULAR | Status: AC
  Filled 2014-03-28: qty 3

## 2014-03-28 MED ORDER — HEMOSTATIC AGENTS (NO CHARGE) OPTIME
TOPICAL | Status: DC | PRN
  Administered 2014-03-28: 1 via TOPICAL

## 2014-03-28 MED ORDER — OXYCODONE HCL 5 MG PO TABS
15.0000 mg | ORAL_TABLET | ORAL | Status: DC | PRN
  Administered 2014-03-28: 15 mg via ORAL
  Filled 2014-03-28: qty 3

## 2014-03-28 MED ORDER — HYDROMORPHONE HCL 1 MG/ML IJ SOLN
INTRAMUSCULAR | Status: AC
  Administered 2014-03-28: 0.5 mg via INTRAVENOUS
  Filled 2014-03-28: qty 1

## 2014-03-28 MED ORDER — OXYCODONE HCL 5 MG PO TABS
ORAL_TABLET | ORAL | Status: AC
  Administered 2014-03-28: 5 mg via ORAL
  Filled 2014-03-28: qty 1

## 2014-03-28 MED ORDER — LIDOCAINE HCL (CARDIAC) 20 MG/ML IV SOLN
INTRAVENOUS | Status: DC | PRN
  Administered 2014-03-28: 100 mg via INTRAVENOUS

## 2014-03-28 MED ORDER — HYDROCODONE-ACETAMINOPHEN 5-325 MG PO TABS: 1.0000 | ORAL_TABLET | ORAL | Status: DC | PRN

## 2014-03-28 MED ORDER — ONDANSETRON HCL 4 MG/2ML IJ SOLN
INTRAMUSCULAR | Status: DC | PRN
  Administered 2014-03-28: 4 mg via INTRAVENOUS

## 2014-03-28 MED ORDER — ONDANSETRON HCL 4 MG/2ML IJ SOLN: 4.0000 mg | INTRAMUSCULAR | Status: DC | PRN

## 2014-03-28 MED ORDER — VANCOMYCIN HCL IN DEXTROSE 1-5 GM/200ML-% IV SOLN
1000.0000 mg | Freq: Once | INTRAVENOUS | Status: DC
Start: 2014-03-28 — End: 2014-03-29
  Filled 2014-03-28: qty 200

## 2014-03-28 MED ORDER — HYDROMORPHONE HCL 1 MG/ML IJ SOLN
0.2500 mg | INTRAMUSCULAR | Status: DC | PRN
  Administered 2014-03-28 (×6): 0.5 mg via INTRAVENOUS

## 2014-03-28 MED ORDER — DEXAMETHASONE SODIUM PHOSPHATE 4 MG/ML IJ SOLN
INTRAMUSCULAR | Status: AC
  Filled 2014-03-28: qty 1

## 2014-03-28 MED ORDER — BUPIVACAINE-EPINEPHRINE (PF) 0.5% -1:200000 IJ SOLN
INTRAMUSCULAR | Status: DC | PRN
  Administered 2014-03-28: 10 mL

## 2014-03-28 MED ORDER — FENTANYL CITRATE 0.05 MG/ML IJ SOLN
INTRAMUSCULAR | Status: DC | PRN
  Administered 2014-03-28: 100 ug via INTRAVENOUS
  Administered 2014-03-28 (×3): 50 ug via INTRAVENOUS

## 2014-03-28 MED ORDER — PHENYLEPHRINE HCL 10 MG/ML IJ SOLN
INTRAMUSCULAR | Status: DC | PRN
  Administered 2014-03-28: 40 ug via INTRAVENOUS

## 2014-03-28 MED ORDER — OXYCODONE-ACETAMINOPHEN 5-325 MG PO TABS
ORAL_TABLET | ORAL | Status: AC
Start: 2014-03-28 — End: 2014-03-28
  Administered 2014-03-28: 2 via ORAL
  Filled 2014-03-28: qty 2

## 2014-03-28 MED ORDER — GLYCOPYRROLATE 0.2 MG/ML IJ SOLN
INTRAMUSCULAR | Status: DC | PRN
  Administered 2014-03-28: 0.6 mg via INTRAVENOUS

## 2014-03-28 MED ORDER — MIDAZOLAM HCL 2 MG/2ML IJ SOLN
INTRAMUSCULAR | Status: AC
  Filled 2014-03-28: qty 2

## 2014-03-28 MED ORDER — PROPOFOL 10 MG/ML IV BOLUS
INTRAVENOUS | Status: DC | PRN
  Administered 2014-03-28: 200 mg via INTRAVENOUS

## 2014-03-28 MED ORDER — ALUM & MAG HYDROXIDE-SIMETH 200-200-20 MG/5ML PO SUSP: 30.0000 mL | Freq: Four times a day (QID) | ORAL | Status: DC | PRN

## 2014-03-28 MED ORDER — NEOSTIGMINE METHYLSULFATE 10 MG/10ML IV SOLN
INTRAVENOUS | Status: DC | PRN
  Administered 2014-03-28: 4 mg via INTRAVENOUS

## 2014-03-28 MED ORDER — PROMETHAZINE HCL 25 MG/ML IJ SOLN
6.2500 mg | INTRAMUSCULAR | Status: DC | PRN
Start: 2014-03-28 — End: 2014-03-28
  Administered 2014-03-28: 6.25 mg via INTRAVENOUS

## 2014-03-28 MED ORDER — LACTATED RINGERS IV SOLN
INTRAVENOUS | Status: DC | PRN
  Administered 2014-03-28 (×2): via INTRAVENOUS

## 2014-03-28 MED ORDER — SCOPOLAMINE 1 MG/3DAYS TD PT72
MEDICATED_PATCH | TRANSDERMAL | Status: AC
  Administered 2014-03-28: 1 via TRANSDERMAL
  Filled 2014-03-28: qty 1

## 2014-03-28 MED ORDER — ESCITALOPRAM OXALATE 20 MG PO TABS
20.0000 mg | ORAL_TABLET | Freq: Every day | ORAL | Status: DC
  Filled 2014-03-28: qty 1

## 2014-03-28 MED ORDER — ACETAMINOPHEN 650 MG RE SUPP: 650.0000 mg | RECTAL | Status: DC | PRN

## 2014-03-28 MED ORDER — ARTIFICIAL TEARS OP OINT
TOPICAL_OINTMENT | OPHTHALMIC | Status: AC
Start: 2014-03-28 — End: 2014-03-28
  Filled 2014-03-28: qty 3.5

## 2014-03-28 MED ORDER — ARTIFICIAL TEARS OP OINT
TOPICAL_OINTMENT | OPHTHALMIC | Status: DC | PRN
  Administered 2014-03-28: 1 via OPHTHALMIC

## 2014-03-28 MED ORDER — OXYCODONE HCL 5 MG PO TABS
5.0000 mg | ORAL_TABLET | Freq: Once | ORAL | Status: AC | PRN
  Administered 2014-03-28: 5 mg via ORAL

## 2014-03-28 MED ORDER — MIDAZOLAM HCL 5 MG/5ML IJ SOLN
INTRAMUSCULAR | Status: DC | PRN
  Administered 2014-03-28: 2 mg via INTRAVENOUS

## 2014-03-28 MED ORDER — FENTANYL CITRATE 0.05 MG/ML IJ SOLN
INTRAMUSCULAR | Status: AC
  Filled 2014-03-28: qty 5

## 2014-03-28 MED ORDER — DIAZEPAM 5 MG PO TABS
ORAL_TABLET | ORAL | Status: AC
  Administered 2014-03-28: 5 mg via ORAL
  Filled 2014-03-28: qty 1

## 2014-03-28 MED ORDER — DEXAMETHASONE SODIUM PHOSPHATE 4 MG/ML IJ SOLN
INTRAMUSCULAR | Status: DC | PRN
  Administered 2014-03-28: 4 mg via INTRAVENOUS

## 2014-03-28 SURGICAL SUPPLY — 59 items
APL SKNCLS STERI-STRIP NONHPOA (GAUZE/BANDAGES/DRESSINGS) ×1
BAG DECANTER FOR FLEXI CONT (MISCELLANEOUS) ×3 IMPLANT
BENZOIN TINCTURE PRP APPL 2/3 (GAUZE/BANDAGES/DRESSINGS) ×3 IMPLANT
BLADE CLIPPER SURG (BLADE) IMPLANT
BRUSH SCRUB EZ PLAIN DRY (MISCELLANEOUS) ×3 IMPLANT
BUR MATCHSTICK NEURO 3.0 LAGG (BURR) ×3 IMPLANT
BUR PRECISION FLUTE 6.0 (BURR) ×3 IMPLANT
CANISTER SUCT 3000ML PPV (MISCELLANEOUS) ×3 IMPLANT
CLOSURE WOUND 1/2 X4 (GAUZE/BANDAGES/DRESSINGS) ×1
CONT SPEC 4OZ CLIKSEAL STRL BL (MISCELLANEOUS) ×3 IMPLANT
DRAPE LAPAROTOMY 100X72X124 (DRAPES) ×3 IMPLANT
DRAPE MICROSCOPE LEICA (MISCELLANEOUS) ×3 IMPLANT
DRAPE POUCH INSTRU U-SHP 10X18 (DRAPES) ×3 IMPLANT
DRAPE SURG 17X23 STRL (DRAPES) ×12 IMPLANT
ELECT BLADE 4.0 EZ CLEAN MEGAD (MISCELLANEOUS) ×3
ELECT REM PT RETURN 9FT ADLT (ELECTROSURGICAL) ×3
ELECTRODE BLDE 4.0 EZ CLN MEGD (MISCELLANEOUS) ×1 IMPLANT
ELECTRODE REM PT RTRN 9FT ADLT (ELECTROSURGICAL) ×1 IMPLANT
GAUZE SPONGE 4X4 12PLY STRL (GAUZE/BANDAGES/DRESSINGS) ×3 IMPLANT
GAUZE SPONGE 4X4 16PLY XRAY LF (GAUZE/BANDAGES/DRESSINGS) IMPLANT
GLOVE BIO SURGEON STRL SZ7 (GLOVE) ×2 IMPLANT
GLOVE BIO SURGEON STRL SZ8 (GLOVE) ×3 IMPLANT
GLOVE BIO SURGEON STRL SZ8.5 (GLOVE) ×3 IMPLANT
GLOVE BIOGEL PI IND STRL 7.5 (GLOVE) IMPLANT
GLOVE BIOGEL PI INDICATOR 7.5 (GLOVE) ×4
GLOVE ECLIPSE 7.0 STRL STRAW (GLOVE) ×2 IMPLANT
GLOVE EXAM NITRILE LRG STRL (GLOVE) IMPLANT
GLOVE EXAM NITRILE MD LF STRL (GLOVE) IMPLANT
GLOVE EXAM NITRILE XL STR (GLOVE) IMPLANT
GLOVE EXAM NITRILE XS STR PU (GLOVE) IMPLANT
GLOVE SURG SS PI 7.0 STRL IVOR (GLOVE) ×4 IMPLANT
GOWN STRL REUS W/ TWL LRG LVL3 (GOWN DISPOSABLE) IMPLANT
GOWN STRL REUS W/ TWL XL LVL3 (GOWN DISPOSABLE) ×1 IMPLANT
GOWN STRL REUS W/TWL 2XL LVL3 (GOWN DISPOSABLE) IMPLANT
GOWN STRL REUS W/TWL LRG LVL3 (GOWN DISPOSABLE) ×3
GOWN STRL REUS W/TWL XL LVL3 (GOWN DISPOSABLE) ×6
KIT BASIN OR (CUSTOM PROCEDURE TRAY) ×3 IMPLANT
KIT ROOM TURNOVER OR (KITS) ×3 IMPLANT
NDL HYPO 21X1.5 SAFETY (NEEDLE) IMPLANT
NDL SPNL 18GX3.5 QUINCKE PK (NEEDLE) IMPLANT
NEEDLE HYPO 21X1.5 SAFETY (NEEDLE) IMPLANT
NEEDLE HYPO 22GX1.5 SAFETY (NEEDLE) ×3 IMPLANT
NEEDLE SPNL 18GX3.5 QUINCKE PK (NEEDLE) ×3 IMPLANT
NS IRRIG 1000ML POUR BTL (IV SOLUTION) ×3 IMPLANT
PACK LAMINECTOMY NEURO (CUSTOM PROCEDURE TRAY) ×3 IMPLANT
PAD ARMBOARD 7.5X6 YLW CONV (MISCELLANEOUS) ×13 IMPLANT
PATTIES SURGICAL .5 X1 (DISPOSABLE) IMPLANT
RUBBERBAND STERILE (MISCELLANEOUS) ×6 IMPLANT
SPONGE SURGIFOAM ABS GEL SZ50 (HEMOSTASIS) ×3 IMPLANT
STRIP CLOSURE SKIN 1/2X4 (GAUZE/BANDAGES/DRESSINGS) ×2 IMPLANT
SUT VIC AB 1 CT1 18XBRD ANBCTR (SUTURE) ×1 IMPLANT
SUT VIC AB 1 CT1 8-18 (SUTURE) ×3
SUT VIC AB 2-0 CP2 18 (SUTURE) ×4 IMPLANT
SYR 20CC LL (SYRINGE) IMPLANT
SYR 20ML ECCENTRIC (SYRINGE) ×3 IMPLANT
TAPE CLOTH SURG 4X10 WHT LF (GAUZE/BANDAGES/DRESSINGS) ×3 IMPLANT
TOWEL OR 17X24 6PK STRL BLUE (TOWEL DISPOSABLE) ×3 IMPLANT
TOWEL OR 17X26 10 PK STRL BLUE (TOWEL DISPOSABLE) ×3 IMPLANT
WATER STERILE IRR 1000ML POUR (IV SOLUTION) ×3 IMPLANT

## 2014-03-28 NOTE — Progress Notes (Signed)
Pharmacy consulted to dose vancomycin for surgical prophlyaxis for 1 dose 12 hours post-op unless pt has a drain. Pt does not have a drain. Vanc 1 gm given pre-op at 0735 Plan: vanc 1 gm x 1 at 2000 Thanks Herby AbrahamMichelle T. Doreene Forrey, Pharm.D. [phone] 03/28/2014 1:52 PM

## 2014-03-28 NOTE — Anesthesia Postprocedure Evaluation (Signed)
Anesthesia Post Note  Patient: Abigail Fieldingrystal G Wadsworth  Procedure(s) Performed: Procedure(s) (LRB): RIGHT THORACIC ELEVEN-TWELVE MICRODICSECTOMY (Right)  Anesthesia type: general  Patient location: PACU  Post pain: Pain level controlled  Post assessment: Patient's Cardiovascular Status Stable  Last Vitals:  Filed Vitals:   03/28/14 1030  BP:   Pulse: 76  Temp:   Resp:     Post vital signs: Reviewed and stable  Level of consciousness: sedated  Complications: No apparent anesthesia complications

## 2014-03-28 NOTE — Progress Notes (Signed)
Patient ID: [name], female   DOB: 01/20/1980, 34 y.o.   MRN: [id number] Subjective:  The patient is alert and pleasant. She is in no apparent distress.  Objective: Vital signs in last 24 hours: Temp:  [98.6 F (37 C)] 98.6 F (37 C) (03/24 0605) Pulse Rate:  [61] 61 (03/24 0605) Resp:  [20] 20 (03/24 0605) BP: (110)/(56) 110/56 mmHg (03/24 0605) SpO2:  [98 %] 98 % (03/24 0605) Weight:  [76.658 kg (169 lb)] 76.658 kg (169 lb) (03/24 0605)  Intake/Output from previous day:   Intake/Output this shift: Total I/O In: 1000 [I.V.:1000] Out: -   Physical exam the patient is alert and pleasant. She is moving her lower extremities well.  Lab Results:  Recent Labs  03/28/14 0634  WBC 11.1*  HGB 15.2*  HCT 45.8  PLT 237   BMET No results for input(s): NA, K, CL, CO2, GLUCOSE, BUN, CREATININE, CALCIUM in the last 72 hours.  Studies/Results: No results found.  Assessment/Plan: The patient is doing well.      [name] D 03/28/2014, 9:17 AM

## 2014-03-28 NOTE — Plan of Care (Signed)
Problem: Consults Goal: Diagnosis - Spinal Surgery Outcome: Completed/Met Date Met:  03/28/14 Lumbar Laminectomy (Complex)     

## 2014-03-28 NOTE — H&P (Signed)
Subjective: It is a 34 year old white female who has complained of right lower thoracic pain and radiculopathy. She has failed medical management and was worked up with a lumbar MRI. This demonstrated a herniated disc at T11-12 on the right. I discussed the various treatment options with patient including surgery. She has weighed the risks, benefits, and alternative surgery and decided proceed with a right T11-12 discectomy.   Past Medical History  Diagnosis Date  . Chronic back pain     herniated disc  . Chronic pelvic pain in female   . History of bronchitis     yrs ago  . Seasonal allergies     CLaritin and FLonase daily as needed  . Headache     constant  . History of kidney stones   . Nocturia   . Depression     takes Lexapro daily  . Anxiety     takes Xanax daily as needed  . Insomnia     takes Elavil nightly    Past Surgical History  Procedure Laterality Date  . Cholecystectomy    . Tubal ligation      Allergies  Allergen Reactions  . Motrin [Ibuprofen]     Hives emesis  . Penicillins Nausea And Vomiting  . Ultram [Tramadol] Nausea And Vomiting  . Aspirin Nausea And Vomiting and Rash    History  Substance Use Topics  . Smoking status: Current Every Day Smoker -- 0.50 packs/day for 5 years    Types: Cigarettes  . Smokeless tobacco: Never Used  . Alcohol Use: No    History reviewed. No pertinent family history. Prior to Admission medications   Medication Sig Start Date End Date Taking? Authorizing Provider  ALPRAZolam Prudy Feeler) 1 MG tablet Take 1 tablet (1 mg total) by mouth 3 (three) times daily as needed for anxiety. 03/14/14  Yes Campbell Riches, NP  amitriptyline (ELAVIL) 10 MG tablet Take 2 tablets (20 mg total) by mouth at bedtime as needed for sleep. 02/07/14  Yes Campbell Riches, NP  escitalopram (LEXAPRO) 20 MG tablet Take 1 tablet (20 mg total) by mouth daily. 03/14/14 03/14/15 Yes Campbell Riches, NP  oxyCODONE-acetaminophen (PERCOCET/ROXICET) 5-325  MG per tablet One po BID prn 01/28/14  Yes Campbell Riches, NP  fluticasone (FLONASE) 50 MCG/ACT nasal spray Place 2 sprays into both nostrils daily. Patient not taking: Reported on 03/14/2014 11/26/13   Campbell Riches, NP  loratadine (CLARITIN) 10 MG tablet Take 1 tablet (10 mg total) by mouth daily. Patient not taking: Reported on 03/14/2014 11/26/13   Campbell Riches, NP     Review of Systems  Positive ROS: As above  All other systems have been reviewed and were otherwise negative with the exception of those mentioned in the HPI and as above.  Objective: Vital signs in last 24 hours: Temp:  [98.6 F (37 C)] 98.6 F (37 C) (03/24 0605) Pulse Rate:  [61] 61 (03/24 0605) Resp:  [20] 20 (03/24 0605) BP: (110)/(56) 110/56 mmHg (03/24 0605) SpO2:  [98 %] 98 % (03/24 0605) Weight:  [76.658 kg (169 lb)] 76.658 kg (169 lb) (03/24 0605)  General Appearance: Alert, cooperative, no distress, Head: Normocephalic, without obvious abnormality, atraumatic Eyes: PERRL, conjunctiva/corneas clear, EOM's intact,    Ears: Normal  Throat: Normal  Neck: Supple, symmetrical, trachea midline, no adenopathy; thyroid: No enlargement/tenderness/nodules; no carotid bruit or JVD Back: Symmetric, no curvature, ROM normal, no CVA tenderness Lungs: Clear to auscultation bilaterally, respirations unlabored Heart: Regular rate and  rhythm, no murmur, rub or gallop Abdomen: Soft, non-tender,, no masses, no organomegaly Extremities: Extremities normal, atraumatic, no cyanosis or edema Pulses: 2+ and symmetric all extremities Skin: Skin color, texture, turgor normal, no rashes or lesions  NEUROLOGIC:   Mental status: alert and oriented, no aphasia, good attention span, Fund of knowledge/ memory ok Motor Exam - grossly normal Sensory Exam - grossly normal Reflexes:  Coordination - grossly normal Gait - grossly normal Balance - grossly normal Cranial Nerves: I: smell Not tested  II: visual acuity  OS:  Normal  OD: Normal   II: visual fields Full to confrontation  II: pupils Equal, round, reactive to light  III,VII: ptosis None  III,IV,VI: extraocular muscles  Full ROM  V: mastication Normal  V: facial light touch sensation  Normal  V,VII: corneal reflex  Present  VII: facial muscle function - upper  Normal  VII: facial muscle function - lower Normal  VIII: hearing Not tested  IX: soft palate elevation  Normal  IX,X: gag reflex Present  XI: trapezius strength  5/5  XI: sternocleidomastoid strength 5/5  XI: neck flexion strength  5/5  XII: tongue strength  Normal    Data Review Lab Results  Component Value Date   WBC 11.1* 03/28/2014   HGB 15.2* 03/28/2014   HCT 45.8 03/28/2014   MCV 95.2 03/28/2014   PLT 237 03/28/2014   Lab Results  Component Value Date   NA 138 03/20/2014   K 4.7 03/20/2014   CL 106 03/20/2014   CO2 21 03/20/2014   BUN 7 03/20/2014   CREATININE 0.70 03/20/2014   GLUCOSE 91 03/20/2014   No results found for: INR, PROTIME  Assessment/Plan: Right T11-12 herniated disc, thoracic pain, thoracic radiculopathy: I have discussed the situation with the patient. I have reviewed her MRI scan with her and pointed out the abnormalities. We have discussed the various treatment options including surgery. I have described the surgical treatment option of a right T11-12 discectomy. I have shown her surgical models. We have discussed the risks, benefits, alternatives, and likelihood of achieving our goals with surgery. I have answered all the patient's questions. She has decided to proceed with surgery.   [name] D 03/28/2014 7:23 AM

## 2014-03-28 NOTE — Transfer of Care (Signed)
Immediate Anesthesia Transfer of Care Note  Patient: Abigail Fieldingrystal G Elena  Procedure(s) Performed: Procedure(s) with comments: RIGHT THORACIC ELEVEN-TWELVE MICRODICSECTOMY (Right) - right  Patient Location: PACU  Anesthesia Type:General  Level of Consciousness: awake, alert  and oriented  Airway & Oxygen Therapy: Patient Spontanous Breathing and Patient connected to nasal cannula oxygen  Post-op Assessment: Report given to RN, Post -op Vital signs reviewed and stable and Patient moving all extremities X 4  Post vital signs: Reviewed and stable  Last Vitals:  Filed Vitals:   03/28/14 0605  BP: 110/56  Pulse: 61  Temp: 37 C  Resp: 20    Complications: No apparent anesthesia complications

## 2014-03-28 NOTE — Discharge Summary (Signed)
Physician Discharge Summary  Patient ID: [name] MRN: [id number] DOB/AGE: October 11, 1980 34 y.o.  Admit date: 03/28/2014 Discharge date: 03/28/2014  Admission Diagnoses: T11-12 herniated disc, thoracic pain, thoracic radiculopathy  Discharge Diagnoses: The same Active Problems:   Herniated nucleus pulposus, thoracic   Discharged Condition: good  Hospital Course: I performed a right T11-12 discectomy on the patient on 03/28/2014. The surgery went well.  The patient's postoperative course was unremarkable. She requested discharge to home on 03/28/2014. The patient, and her husband, were given oral and written discharge instructions. All her questions were answered.  Consults: None Significant Diagnostic Studies: None Treatments: Right T11-12 transpedicular discectomy using microdissection Discharge Exam: Blood pressure 114/80, pulse 81, temperature 98.7 F (37.1 C), temperature source Oral, resp. rate 15, weight 76.658 kg (169 lb), last menstrual period 03/18/2014, SpO2 95 %. The patient is alert and pleasant. She looks well. She is in no apparent distress. Her strength is normal in lower extremities. Her dressing is clean and dry.  Disposition: Home  Discharge Instructions     Remove dressing in 72 hours    Complete by:  As directed      Call MD for:  difficulty breathing, headache or visual disturbances    Complete by:  As directed      Call MD for:  extreme fatigue    Complete by:  As directed      Call MD for:  hives    Complete by:  As directed      Call MD for:  persistant dizziness or light-headedness    Complete by:  As directed      Call MD for:  persistant nausea and vomiting    Complete by:  As directed      Call MD for:  redness, tenderness, or signs of infection (pain, swelling, redness, odor or green/yellow discharge around incision site)    Complete by:  As directed      Call MD for:  severe uncontrolled pain    Complete by:  As directed      Call MD  for:  temperature >100.4    Complete by:  As directed      Diet - low sodium heart healthy    Complete by:  As directed      Discharge instructions    Complete by:  As directed   Call [phone] for a followup appointment. Take a stool softener while you are using pain medications.     Driving Restrictions    Complete by:  As directed   Do not drive for 2 weeks.     Increase activity slowly    Complete by:  As directed      Lifting restrictions    Complete by:  As directed   Do not lift more than 5 pounds. No excessive bending or twisting.     May shower / Bathe    Complete by:  As directed   He may shower after the pain she is removed 3 days after surgery. Leave the incision alone.            Medication List    STOP taking these medications        ALPRAZolam 1 MG tablet  Commonly known as:  XANAX     oxyCODONE-acetaminophen 5-325 MG per tablet  Commonly known as:  PERCOCET/ROXICET      TAKE these medications        amitriptyline 10 MG tablet  Commonly known as:  ELAVIL  Take 2 tablets (20 mg total) by mouth at bedtime as needed for sleep.     diazepam 5 MG tablet  Commonly known as:  VALIUM  Take 1 tablet (5 mg total) by mouth 3 (three) times daily as needed for anxiety or muscle spasms.     docusate sodium 100 MG capsule  Commonly known as:  COLACE  Take 1 capsule (100 mg total) by mouth 2 (two) times daily.     escitalopram 20 MG tablet  Commonly known as:  LEXAPRO  Take 1 tablet (20 mg total) by mouth daily.     fluticasone 50 MCG/ACT nasal spray  Commonly known as:  FLONASE  Place 2 sprays into both nostrils daily.     loratadine 10 MG tablet  Commonly known as:  CLARITIN  Take 1 tablet (10 mg total) by mouth daily.     oxyCODONE 15 MG immediate release tablet  Commonly known as:  ROXICODONE  Take 1 tablet (15 mg total) by mouth every 4 (four) hours as needed for severe pain.         SignedCristi [name]: [name] 03/28/2014, 3:32 PM

## 2014-03-28 NOTE — Anesthesia Preprocedure Evaluation (Addendum)
Anesthesia Evaluation  Patient identified by MRN, date of birth, ID band Patient awake    Reviewed: Allergy & Precautions, NPO status , Patient's Chart, lab work & pertinent test results  History of Anesthesia Complications Negative for: history of anesthetic complications  Airway Mallampati: II  TM Distance: >3 FB Neck ROM: Full    Dental  (+) Teeth Intact, Dental Advisory Given, Poor Dentition   Pulmonary Current Smoker,    Pulmonary exam normal       Cardiovascular negative cardio ROS      Neuro/Psych  Headaches, Anxiety Depression Bipolar Disorder    GI/Hepatic negative GI ROS, Neg liver ROS,   Endo/Other  negative endocrine ROS  Renal/GU negative Renal ROS     Musculoskeletal   Abdominal   Peds  Hematology   Anesthesia Other Findings   Reproductive/Obstetrics                           Anesthesia Physical Anesthesia Plan  ASA: II  Anesthesia Plan: General   Post-op Pain Management:    Induction: Intravenous  Airway Management Planned: Oral ETT  Additional Equipment:   Intra-op Plan:   Post-operative Plan: Extubation in OR  Informed Consent: I have reviewed the patients History and Physical, chart, labs and discussed the procedure including the risks, benefits and alternatives for the proposed anesthesia with the patient or authorized representative who has indicated his/her understanding and acceptance.   Dental advisory given  Plan Discussed with: CRNA, Anesthesiologist and Surgeon  Anesthesia Plan Comments:        Anesthesia Quick Evaluation

## 2014-03-28 NOTE — Progress Notes (Signed)
Patient alert and oriented, mae's well, voiding adequate amount of urine, swallowing without difficulty, c/o moderate pain and meds given prior to discharged. Patient discharged home with family. Script and discharged instructions given to patient. Patient and family stated understanding of instructions given. Aisha Dionna Wiedemann RN  

## 2014-03-28 NOTE — Op Note (Signed)
Brief history: The patient is a 34 year old white female who has complained of thoracic pain consistent with a thoracic radiculopathy. She has failed medical management and was worked up with a thoracic MRI which demonstrated a herniated disc at T11-12 on the right. I discussed the various treatment options with the patient including surgery. She has weighed the risks, benefits, and alternative surgery and decided proceed with a thoracic discectomy.  Preoperative diagnosis: Right T11-12 herniated disc, thoracic pain, thoracic radiculopathy  Postoperative diagnosis: The same  Procedure: Right T11-12 transpedicular Intervertebral discectomy using micro-dissection  Surgeon: Dr. Delma OfficerJeff Sai Moura  Asst.: Dr. Conchita ParisNundkumar  Anesthesia: Gen. endotracheal  Estimated blood loss: Minimal  Drains: None  Complications: None  Description of procedure: The patient was brought to the operating room by the anesthesia team. General endotracheal anesthesia was induced. The patient was turned to the prone position on the Wilson frame. The patient's lumbosacral region was then prepared with Betadine scrub and Betadine solution. Sterile drapes were applied.  I then injected the area to be incised with Marcaine with epinephrine solution. I then used a scalpel to make a linear midline incision over the T11-12 intervertebral disc space. I then used electrocautery to perform a right sided subperiosteal dissection exposing the spinous process and lamina of T11 and T12. We obtained intraoperative radiograph to confirm our location. I then inserted the [facility] retractor for exposure.  We then brought the operative microscope into the field. Under its magnification and illumination we completed the microdissection. I used a high-speed drill to perform a laminotomy at T11 on the right. I then used a Kerrison punches to widen the laminotomy and removed the ligamentum flavum at T11-12 on the right. We then used microdissection  to free up the thecal sac and the exiting T11 nerve root nerve root from the epidural tissue. I then used a Kerrison punch to perform a foraminotomy at about the T11  nerve root. Working lateral to the thecal sac we used microdissection to inspect the intervertebral disc. There was a herniated disc which had migrated in a cephalad direction. We removed the multiple fragments using the micropituitary forceps.  I then palpated along the ventral surface of the thecal sac and along exit route of the T11 nerve root and noted that the neural structures were well decompressed. This completed the decompression.  We then obtained hemostasis using bipolar electrocautery. We irrigated the wound out with bacitracin solution. We then removed the retractor. We then reapproximated the patient's thoracolumbar fascia with interrupted #1 Vicryl suture. We then reapproximated the patient's subcutaneous tissue with interrupted 2-0 Vicryl suture. We then reapproximated patient's skin with Steri-Strips and benzoin. The was then coated with bacitracin ointment. The drapes were removed. The patient was subsequently returned to the supine position where they were extubated by the anesthesia team. The patient was then transported to the postanesthesia care unit in stable condition. All sponge instrument and needle counts were reportedly correct at the end of this case.

## 2014-03-28 NOTE — Anesthesia Procedure Notes (Signed)
Procedure Name: Intubation Date/Time: 03/28/2014 7:28 AM Performed by: Glo HerringLEE, Julianne Chamberlin B Pre-anesthesia Checklist: Patient identified, Timeout performed, Emergency Drugs available, Suction available and Patient being monitored Patient Re-evaluated:Patient Re-evaluated prior to inductionOxygen Delivery Method: Circle system utilized Preoxygenation: Pre-oxygenation with 100% oxygen Intubation Type: IV induction Ventilation: Mask ventilation without difficulty Laryngoscope Size: Mac and 3 Grade View: Grade I Tube type: Oral Tube size: 7.0 mm Number of attempts: 1 Airway Equipment and Method: Stylet Placement Confirmation: CO2 detector,  positive ETCO2,  ETT inserted through vocal cords under direct vision and breath sounds checked- equal and bilateral Secured at: 21 cm Tube secured with: Tape Dental Injury: Teeth and Oropharynx as per pre-operative assessment

## 2014-04-01 ENCOUNTER — Encounter (HOSPITAL_COMMUNITY): Payer: Self-pay | Admitting: Neurosurgery

## 2014-04-03 ENCOUNTER — Observation Stay (HOSPITAL_COMMUNITY)
Admission: EM | Admit: 2014-04-03 | Discharge: 2014-04-05 | Disposition: A | Discharge status: Home / Self Care | Payer: Medicaid Other | Attending: Neurological Surgery | Admitting: Neurological Surgery

## 2014-04-03 ENCOUNTER — Emergency Department (HOSPITAL_COMMUNITY): Payer: Medicaid Other

## 2014-04-03 ENCOUNTER — Encounter (HOSPITAL_COMMUNITY): Payer: Self-pay | Admitting: *Deleted

## 2014-04-03 DIAGNOSIS — Z885 Allergy status to narcotic agent status: Secondary | ICD-10-CM | POA: Diagnosis not present

## 2014-04-03 DIAGNOSIS — Z886 Allergy status to analgesic agent status: Secondary | ICD-10-CM | POA: Insufficient documentation

## 2014-04-03 DIAGNOSIS — F17210 Nicotine dependence, cigarettes, uncomplicated: Secondary | ICD-10-CM | POA: Insufficient documentation

## 2014-04-03 DIAGNOSIS — G4700 Insomnia, unspecified: Secondary | ICD-10-CM | POA: Insufficient documentation

## 2014-04-03 DIAGNOSIS — Z87442 Personal history of urinary calculi: Secondary | ICD-10-CM | POA: Insufficient documentation

## 2014-04-03 DIAGNOSIS — M546 Pain in thoracic spine: Secondary | ICD-10-CM | POA: Diagnosis not present

## 2014-04-03 DIAGNOSIS — Z880 Allergy status to penicillin: Secondary | ICD-10-CM | POA: Diagnosis not present

## 2014-04-03 DIAGNOSIS — M47894 Other spondylosis, thoracic region: Secondary | ICD-10-CM | POA: Insufficient documentation

## 2014-04-03 DIAGNOSIS — J029 Acute pharyngitis, unspecified: Secondary | ICD-10-CM | POA: Insufficient documentation

## 2014-04-03 DIAGNOSIS — F419 Anxiety disorder, unspecified: Secondary | ICD-10-CM | POA: Insufficient documentation

## 2014-04-03 DIAGNOSIS — M47814 Spondylosis without myelopathy or radiculopathy, thoracic region: Secondary | ICD-10-CM | POA: Diagnosis present

## 2014-04-03 DIAGNOSIS — R059 Cough, unspecified: Secondary | ICD-10-CM

## 2014-04-03 DIAGNOSIS — G8918 Other acute postprocedural pain: Secondary | ICD-10-CM | POA: Diagnosis present

## 2014-04-03 DIAGNOSIS — R05 Cough: Secondary | ICD-10-CM | POA: Insufficient documentation

## 2014-04-03 DIAGNOSIS — F329 Major depressive disorder, single episode, unspecified: Secondary | ICD-10-CM | POA: Diagnosis not present

## 2014-04-03 LAB — BASIC METABOLIC PANEL
ANION GAP: 10 (ref 5–15)
BUN: 6 mg/dL (ref 6–23)
CALCIUM: 8.8 mg/dL (ref 8.4–10.5)
CO2: 25 mmol/L (ref 19–32)
Chloride: 102 mmol/L (ref 96–112)
Creatinine, Ser: 0.7 mg/dL (ref 0.50–1.10)
GFR calc non Af Amer: >90 mL/min (ref >90)
GLUCOSE: 89 mg/dL (ref 70–99)
Potassium: 4.1 mmol/L (ref 3.5–5.1)
Sodium: 137 mmol/L (ref 135–145)

## 2014-04-03 LAB — POC URINE PREG, ED: PREG TEST UR: NEGATIVE

## 2014-04-03 LAB — SEDIMENTATION RATE: Sed Rate: 41 mm/hr — ABNORMAL HIGH (ref 0–22)

## 2014-04-03 LAB — URINALYSIS, ROUTINE W REFLEX MICROSCOPIC
Bilirubin Urine: NEGATIVE
Glucose, UA: NEGATIVE mg/dL
Hgb urine dipstick: NEGATIVE
Ketones, ur: NEGATIVE mg/dL
Leukocytes, UA: NEGATIVE
Nitrite: NEGATIVE
Protein, ur: NEGATIVE mg/dL
Specific Gravity, Urine: 1.01 (ref 1.005–1.030)
UROBILINOGEN UA: 1 mg/dL (ref 0.0–1.0)
pH: 7.5 (ref 5.0–8.0)

## 2014-04-03 LAB — CBC WITH DIFFERENTIAL/PLATELET
BASOS ABS: 0 10*3/uL (ref 0.0–0.1)
BASOS PCT: 0 % (ref 0–1)
EOS ABS: 0.1 10*3/uL (ref 0.0–0.7)
EOS PCT: 2 % (ref 0–5)
HCT: 40.4 % (ref 36.0–46.0)
Hemoglobin: 13.5 g/dL (ref 12.0–15.0)
LYMPHS ABS: 1.4 10*3/uL (ref 0.7–4.0)
Lymphocytes Relative: 16 % (ref 12–46)
MCH: 31.9 pg (ref 26.0–34.0)
MCHC: 33.4 g/dL (ref 30.0–36.0)
MCV: 95.5 fL (ref 78.0–100.0)
MONOS PCT: 7 % (ref 3–12)
Monocytes Absolute: 0.7 10*3/uL (ref 0.1–1.0)
NEUTROS PCT: 75 % (ref 43–77)
Neutro Abs: 6.7 10*3/uL (ref 1.7–7.7)
Platelets: 319 10*3/uL (ref 150–400)
RBC: 4.23 MIL/uL (ref 3.87–5.11)
RDW: 14.1 % (ref 11.5–15.5)
WBC: 8.9 10*3/uL (ref 4.0–10.5)

## 2014-04-03 MED ORDER — ENOXAPARIN SODIUM 40 MG/0.4ML ~~LOC~~ SOLN
40.0000 mg | SUBCUTANEOUS | Status: DC
  Administered 2014-04-04: 40 mg via SUBCUTANEOUS
  Filled 2014-04-03: qty 0.4

## 2014-04-03 MED ORDER — GADOBENATE DIMEGLUMINE 529 MG/ML IV SOLN
15.0000 mL | Freq: Once | INTRAVENOUS | Status: AC | PRN
  Administered 2014-04-03: 15 mL via INTRAVENOUS

## 2014-04-03 MED ORDER — LORATADINE 10 MG PO TABS: 10.0000 mg | ORAL_TABLET | Freq: Every day | ORAL | Status: DC | PRN

## 2014-04-03 MED ORDER — DIAZEPAM 5 MG PO TABS
5.0000 mg | ORAL_TABLET | Freq: Three times a day (TID) | ORAL | Status: DC | PRN
  Administered 2014-04-04 – 2014-04-05 (×3): 5 mg via ORAL
  Filled 2014-04-03 (×3): qty 1

## 2014-04-03 MED ORDER — KETOROLAC TROMETHAMINE 30 MG/ML IJ SOLN
30.0000 mg | Freq: Once | INTRAMUSCULAR | Status: AC
  Administered 2014-04-03: 30 mg via INTRAVENOUS
  Filled 2014-04-03: qty 1

## 2014-04-03 MED ORDER — KETOROLAC TROMETHAMINE 15 MG/ML IJ SOLN
15.0000 mg | Freq: Four times a day (QID) | INTRAMUSCULAR | Status: AC
  Administered 2014-04-04 (×5): 15 mg via INTRAVENOUS
  Filled 2014-04-03 (×4): qty 1

## 2014-04-03 MED ORDER — DEXAMETHASONE SODIUM PHOSPHATE 4 MG/ML IJ SOLN
4.0000 mg | Freq: Three times a day (TID) | INTRAMUSCULAR | Status: DC
  Administered 2014-04-04 – 2014-04-05 (×5): 4 mg via INTRAVENOUS
  Filled 2014-04-03 (×5): qty 1

## 2014-04-03 MED ORDER — OXYCODONE HCL 5 MG PO TABS
15.0000 mg | ORAL_TABLET | ORAL | Status: DC | PRN
  Administered 2014-04-04 – 2014-04-05 (×6): 15 mg via ORAL
  Filled 2014-04-03 (×7): qty 3

## 2014-04-03 MED ORDER — DOCUSATE SODIUM 100 MG PO CAPS
100.0000 mg | ORAL_CAPSULE | Freq: Two times a day (BID) | ORAL | Status: DC
  Administered 2014-04-04 – 2014-04-05 (×3): 100 mg via ORAL
  Filled 2014-04-03 (×3): qty 1

## 2014-04-03 MED ORDER — HYDROMORPHONE HCL 1 MG/ML IJ SOLN
1.0000 mg | Freq: Once | INTRAMUSCULAR | Status: AC
  Administered 2014-04-03: 1 mg via INTRAVENOUS
  Filled 2014-04-03: qty 1

## 2014-04-03 MED ORDER — FAMOTIDINE IN NACL 20-0.9 MG/50ML-% IV SOLN
20.0000 mg | Freq: Two times a day (BID) | INTRAVENOUS | Status: DC
  Administered 2014-04-04 (×3): 20 mg via INTRAVENOUS
  Filled 2014-04-03 (×3): qty 50

## 2014-04-03 MED ORDER — AMITRIPTYLINE HCL 10 MG PO TABS
20.0000 mg | ORAL_TABLET | Freq: Every evening | ORAL | Status: DC | PRN
  Administered 2014-04-04: 20 mg via ORAL
  Filled 2014-04-03 (×2): qty 2

## 2014-04-03 MED ORDER — FLUTICASONE PROPIONATE 50 MCG/ACT NA SUSP
2.0000 | Freq: Every day | NASAL | Status: DC | PRN
  Filled 2014-04-03: qty 16

## 2014-04-03 MED ORDER — ESCITALOPRAM OXALATE 10 MG PO TABS
20.0000 mg | ORAL_TABLET | Freq: Every day | ORAL | Status: DC
  Administered 2014-04-04 – 2014-04-05 (×2): 20 mg via ORAL
  Filled 2014-04-03 (×2): qty 2

## 2014-04-03 NOTE — Progress Notes (Signed)
Conferred with patient about 30mg  toradol administration. Patient acknowledged that it does not cause an allergic reaction and has taken it before with no issues.

## 2014-04-03 NOTE — ED Notes (Signed)
Attempted report 

## 2014-04-03 NOTE — ED Notes (Signed)
Post Op incision looks to be healing nicely with no noticeable edema or erythema.

## 2014-04-03 NOTE — ED Notes (Signed)
Pt being transported to MRI.

## 2014-04-03 NOTE — ED Notes (Signed)
Pt states that she had back surgery last week. Pt reports continued pain and radiation to bilateral legs. Pt also reports sore throat, cough and leg swelling. Pt denies any drainage from incision site.

## 2014-04-03 NOTE — Progress Notes (Addendum)
Patient arrived to floor via ED. Patient alert and oriented. Noted was scabbed area on back. Patient stated that pain was 10/10. [facility] notified of patient's arrival. Patient oriented to room information and call bell system.

## 2014-04-03 NOTE — H&P (Signed)
[name] is an 34 y.o. female.   Chief Complaint: Back pain status post thoracic discectomy HPI: Ms. [name] is a 34 year old individual who underwent a thoracic discectomy on the right side at T11-T12 on 03/28/2014. She is discharged the same day however she notes that she has had persistent pain since the time of the surgery. Pain became so severe that today she presented to the emergency room. She notes that she's been having edema in her lower extremities and she complained of tingling I discussed the situation with Dr. Hoover Brunette and suggested an MRI be performed. MRI demonstrates that there is some fluid in the epidural space and in the site of the discectomy. The patient has not had a fever she does not have a white count however because of poor ability to control the pain with parenteral medication in the emergency department I advised admission for observation and treatment with initial high doses of steroids.  At this time patient is admitted to the hospital for observation will start on Decadron 4 mg 3 times a day. A sedimentation rate the CRP are still pending. I reviewed the films myself and though I note that there is a fluid collection I'm doubtful that this actually represents an infection. The patient has been on 60 mg of oxycodone per day taking 50 mg oxycodone at least 4 times a day possibly 5.  Past Medical History  Diagnosis Date  . Chronic back pain     herniated disc  . Chronic pelvic pain in female   . History of bronchitis     yrs ago  . Seasonal allergies     CLaritin and FLonase daily as needed  . Headache     constant  . History of kidney stones   . Nocturia   . Depression     takes Lexapro daily  . Anxiety     takes Xanax daily as needed  . Insomnia     takes Elavil nightly    Past Surgical History  Procedure Laterality Date  . Cholecystectomy    . Tubal ligation    . Thoracic discectomy Right 03/28/2014    Procedure: RIGHT THORACIC ELEVEN-TWELVE  MICRODICSECTOMY;  Surgeon: Newman Pies, MD;  Location: [facility];  Service: Neurosurgery;  Laterality: Right;  right    No family history on file. Social History:  reports that she has been smoking Cigarettes.  She has a 2.5 pack-year smoking history. She has never used smokeless tobacco. She reports that she does not drink alcohol or use illicit drugs.  Allergies:  Allergies  Allergen Reactions  . Motrin [Ibuprofen]     Hives emesis  . Penicillins Nausea And Vomiting  . Ultram [Tramadol] Nausea And Vomiting  . Aspirin Nausea And Vomiting and Rash    Medications Prior to Admission  Medication Sig Dispense Refill  . amitriptyline (ELAVIL) 10 MG tablet Take 2 tablets (20 mg total) by mouth at bedtime as needed for sleep. 60 tablet 2  . diazepam (VALIUM) 5 MG tablet Take 1 tablet (5 mg total) by mouth 3 (three) times daily as needed for anxiety or muscle spasms. 50 tablet 1  . docusate sodium (COLACE) 100 MG capsule Take 1 capsule (100 mg total) by mouth 2 (two) times daily. 60 capsule 0  . escitalopram (LEXAPRO) 20 MG tablet Take 1 tablet (20 mg total) by mouth daily. 30 tablet 5  . fluticasone (FLONASE) 50 MCG/ACT nasal spray Place 2 sprays into both nostrils daily. (Patient taking  differently: Place 2 sprays into both nostrils daily as needed for allergies. ) 16 g 6  . loratadine (CLARITIN) 10 MG tablet Take 1 tablet (10 mg total) by mouth daily. (Patient taking differently: Take 10 mg by mouth daily as needed for allergies. ) 30 tablet 11  . oxyCODONE (ROXICODONE) 15 MG immediate release tablet Take 1 tablet (15 mg total) by mouth every 4 (four) hours as needed for severe pain. 100 tablet 0    Results for orders placed or performed during the hospital encounter of 04/03/14 (from the past 48 hour(s))  CBC with Differential/Platelet     Status: None   Collection Time: 04/03/14  5:49 PM  Result Value Ref Range   WBC 8.9 4.0 - 10.5 K/uL   RBC 4.23 3.87 - 5.11 MIL/uL   Hemoglobin  13.5 12.0 - 15.0 g/dL   HCT 40.4 36.0 - 46.0 %   MCV 95.5 78.0 - 100.0 fL   MCH 31.9 26.0 - 34.0 pg   MCHC 33.4 30.0 - 36.0 g/dL   RDW 14.1 11.5 - 15.5 %   Platelets 319 150 - 400 K/uL   Neutrophils Relative % 75 43 - 77 %   Neutro Abs 6.7 1.7 - 7.7 K/uL   Lymphocytes Relative 16 12 - 46 %   Lymphs Abs 1.4 0.7 - 4.0 K/uL   Monocytes Relative 7 3 - 12 %   Monocytes Absolute 0.7 0.1 - 1.0 K/uL   Eosinophils Relative 2 0 - 5 %   Eosinophils Absolute 0.1 0.0 - 0.7 K/uL   Basophils Relative 0 0 - 1 %   Basophils Absolute 0.0 0.0 - 0.1 K/uL  Basic metabolic panel     Status: None   Collection Time: 04/03/14  5:49 PM  Result Value Ref Range   Sodium 137 135 - 145 mmol/L   Potassium 4.1 3.5 - 5.1 mmol/L    Comment: SLIGHT HEMOLYSIS   Chloride 102 96 - 112 mmol/L   CO2 25 19 - 32 mmol/L   Glucose, Bld 89 70 - 99 mg/dL   BUN 6 6 - 23 mg/dL   Creatinine, Ser 0.70 0.50 - 1.10 mg/dL   Calcium 8.8 8.4 - 10.5 mg/dL   GFR calc non Af Amer >90 >90 mL/min   GFR calc Af Amer >90 >90 mL/min    Comment: (NOTE) The eGFR has been calculated using the CKD EPI equation. This calculation has not been validated in all clinical situations. eGFR's persistently <90 mL/min signify possible Chronic Kidney Disease.    Anion gap 10 5 - 15  Urinalysis, Routine w reflex microscopic     Status: None   Collection Time: 04/03/14  6:30 PM  Result Value Ref Range   Color, Urine YELLOW YELLOW   APPearance CLEAR CLEAR   Specific Gravity, Urine 1.010 1.005 - 1.030   pH 7.5 5.0 - 8.0   Glucose, UA NEGATIVE NEGATIVE mg/dL   Hgb urine dipstick NEGATIVE NEGATIVE   Bilirubin Urine NEGATIVE NEGATIVE   Ketones, ur NEGATIVE NEGATIVE mg/dL   Protein, ur NEGATIVE NEGATIVE mg/dL   Urobilinogen, UA 1.0 0.0 - 1.0 mg/dL   Nitrite NEGATIVE NEGATIVE   Leukocytes, UA NEGATIVE NEGATIVE    Comment: MICROSCOPIC NOT DONE ON URINES WITH NEGATIVE PROTEIN, BLOOD, LEUKOCYTES, NITRITE, OR GLUCOSE <1000 mg/dL.  POC urine preg, ED  (not at [facility])     Status: None   Collection Time: 04/03/14  6:56 PM  Result Value Ref Range   Preg Test, Ur  NEGATIVE NEGATIVE    Comment:        THE SENSITIVITY OF THIS METHODOLOGY IS >24 mIU/mL    Dg Chest 2 View  04/03/2014   CLINICAL DATA:  Initial encounter for cough and sore throat for 1 week after surgery.  EXAM: CHEST  2 VIEW  COMPARISON:  05/07/2011.  FINDINGS: The heart size and mediastinal contours are within normal limits. Both lungs are clear. The visualized skeletal structures are unremarkable.  IMPRESSION: No active cardiopulmonary disease.   Electronically Signed   By: Misty Stanley M.D.   On: 04/03/2014 18:14   Mr Thoracic Spine W Wo Contrast  04/03/2014   CLINICAL DATA:  Patient underwent back surgery 03/28/2014, now with continued pain and radiation to the legs. Patient reports severe weakness. No bowel or bladder difficulty reported.  EXAM: MRI THORACIC SPINE WITHOUT AND WITH CONTRAST  TECHNIQUE: Multiplanar and multiecho pulse sequences of the thoracic spine were obtained without and with intravenous contrast.  CONTRAST:  75m MULTIHANCE GADOBENATE DIMEGLUMINE 529 MG/ML IV SOLN  COMPARISON:  MRI lumbar spine 01/18/2014.  FINDINGS: The patient is status post RIGHT T11-12 discectomy via a transpedicular approach. There is much less ventral mass effect from the disc herniation.  There is a small RIGHT-sided ventral epidural fluid collection 3 x 6 mm cross-section at the discectomy site, seen best on axial image 36 series 9, with slight mass effect on the thecal sac and spinal cord. It is unclear if this represents a benign fluid collection remaining in the dead space from discectomy or could potentially represent a small postoperative abscess. Residual disc material is not favored.  There are abnormal fluid collections at the operative site. In the subcutaneous soft tissues, there is a midline T2 hyperintense 14 x 15 x 28 mm collection with surrounding enhancement which could represent a  seroma or abscess. At the laminectomy site, there is a second collection with slight mass effect on the spinal cord and dural sac measuring 15 x 14 x 28 mm also with mild surrounding enhancement. Dorsal epidural abscess is not excluded. No definite abnormal cord signal.  Schmorl's node projecting superiorly from T10-11. No endplate or discal enhancement is evident. No significant bone marrow edema at T11 or T12. No other thoracic disc herniations. Trace BILATERAL pleural effusions but no significant paravertebral fluid collection.  IMPRESSION: Abnormal fluid collections at the incision site, within the transpedicular laminectomy site, and ventrally at the site of discectomy status post RIGHT T11-12 discectomy performed 03/28/2014. Mild peripheral enhancement of both dorsal collections. Mild mass effect on the dural sac and spinal cord.  Subcutaneous abscess, and/or dorsal/ventral epidural abscesses not excluded, although certainly the fluid collections could represent simple postoperative fluid. Neurosurgical consultation is warranted. Findings discussed with ordering provider 8:39 p.m. EDT   Electronically Signed   By: JRolla FlattenM.D.   On: 04/03/2014 20:48    Review of Systems  Constitutional: Positive for malaise/fatigue.  HENT: Negative.   Eyes: Negative.   Respiratory: Negative.   Cardiovascular: Negative.   Genitourinary: Negative.   Musculoskeletal: Positive for back pain.  Skin: Negative.   Neurological: Positive for tingling.  Psychiatric/Behavioral: Negative.     Blood pressure 97/61, pulse 62, temperature 98.3 F (36.8 C), temperature source Oral, resp. rate 19, height 5' 3.5" (1.613 m), weight 79.6 kg (175 lb 7.8 oz), last menstrual period 03/15/2014, SpO2 98 %. Physical Exam  Constitutional: She is oriented to person, place, and time. She appears well-developed and well-nourished.  HENT:  Head:  Normocephalic and atraumatic.  Eyes: Conjunctivae and EOM are normal. Pupils are  equal, round, and reactive to light.  Neck: Normal range of motion. Neck supple.  Cardiovascular: Normal rate and regular rhythm.   Respiratory: Effort normal and breath sounds normal.  GI: Soft. Bowel sounds are normal.  Musculoskeletal:  Incision on back is clean and dry. Diffusely tender to palpation along back. Straight leg raising is negative to 60 bilaterally. Patrick's maneuver is negative. Patient has slight edema at both ankles would be graded at +1. No pitting noted.  Neurological: She is alert and oriented to person, place, and time. She has normal reflexes.  Skin: Skin is warm and dry.  Psychiatric: She has a normal mood and affect. Her behavior is normal. Judgment and thought content normal.     Assessment/Plan As post thoracic discectomy with residual pain. Suspect inflammatory response. Start IV Decadron and Toradol for pain control and observe.  Tabb Croghan J 04/03/2014, 10:45 PM

## 2014-04-03 NOTE — ED Provider Notes (Signed)
CSN: [id number]     Arrival date & time 04/03/14  1457 History   First MD Initiated Contact with Patient 04/03/14 1706     Chief Complaint  Patient presents with  . Post-op Problem     (Consider location/radiation/quality/duration/timing/severity/associated sxs/prior Treatment) HPI Complains of low back pain radiating to both feet since T 11-12 discectomy 6 days ago. She reports she last urinated 2 days ago and has only urinated twice since the surgery. She also complains of nonproductive cough and sore throat since the surgery as well as bilateral leg edema. She denies fever denies dyspnea no loss of bowel control. No other associated symptoms. Treated with Oxy codone, without adequate pain relief. Pain worse with movement improved with remaining still. No other associated symptoms Past Medical History  Diagnosis Date  . Chronic back pain     herniated disc  . Chronic pelvic pain in female   . History of bronchitis     yrs ago  . Seasonal allergies     CLaritin and FLonase daily as needed  . Headache     constant  . History of kidney stones   . Nocturia   . Depression     takes Lexapro daily  . Anxiety     takes Xanax daily as needed  . Insomnia     takes Elavil nightly   Past Surgical History  Procedure Laterality Date  . Cholecystectomy    . Tubal ligation    . Thoracic discectomy Right 03/28/2014    Procedure: RIGHT THORACIC ELEVEN-TWELVE MICRODICSECTOMY;  Surgeon: Tressie Stalker, MD;  Location: [facility];  Service: Neurosurgery;  Laterality: Right;  right   No family history on file. History  Substance Use Topics  . Smoking status: Current Every Day Smoker -- 0.50 packs/day for 5 years    Types: Cigarettes  . Smokeless tobacco: Never Used  . Alcohol Use: No   OB History    No data available     Review of Systems  Constitutional: Negative.   HENT: Positive for sore throat.   Respiratory: Positive for cough.   Cardiovascular: Positive for leg swelling.   Gastrointestinal: Negative.   Musculoskeletal: Positive for back pain.  Skin: Negative.   Neurological: Negative.   Psychiatric/Behavioral: Negative.       Allergies  Motrin; Penicillins; Ultram; and Aspirin  Home Medications   Prior to Admission medications   Medication Sig Start Date End Date Taking? Authorizing Provider  amitriptyline (ELAVIL) 10 MG tablet Take 2 tablets (20 mg total) by mouth at bedtime as needed for sleep. 02/07/14   Campbell Riches, NP  diazepam (VALIUM) 5 MG tablet Take 1 tablet (5 mg total) by mouth 3 (three) times daily as needed for anxiety or muscle spasms. 03/28/14   Tressie Stalker, MD  docusate sodium (COLACE) 100 MG capsule Take 1 capsule (100 mg total) by mouth 2 (two) times daily. 03/28/14   Tressie Stalker, MD  escitalopram (LEXAPRO) 20 MG tablet Take 1 tablet (20 mg total) by mouth daily. 03/14/14 03/14/15  Campbell Riches, NP  fluticasone (FLONASE) 50 MCG/ACT nasal spray Place 2 sprays into both nostrils daily. Patient not taking: Reported on 03/14/2014 11/26/13   Campbell Riches, NP  loratadine (CLARITIN) 10 MG tablet Take 1 tablet (10 mg total) by mouth daily. Patient not taking: Reported on 03/14/2014 11/26/13   Campbell Riches, NP  oxyCODONE (ROXICODONE) 15 MG immediate release tablet Take 1 tablet (15 mg total) by mouth every 4 (four)  hours as needed for severe pain. 03/28/14   Tressie StalkerJeffrey Jenkins, MD   BP 108/83 mmHg  Pulse 99  Temp(Src) 98.9 F (37.2 C)  Resp 18  Ht 5\' 3"  (1.6 m)  Wt 165 lb (74.844 kg)  BMI 29.24 kg/m2  SpO2 97%  LMP 03/15/2014 Physical Exam  Constitutional: She is oriented to person, place, and time. She appears well-developed and well-nourished. No distress.  HENT:  Head: Normocephalic and atraumatic.  Right Ear: External ear normal.  Left Ear: External ear normal.  Oral pharynx reddened. No exudate. Uvula midline.  Eyes: Conjunctivae are normal. Pupils are equal, round, and reactive to light.  Neck: Neck supple.  No tracheal deviation present. No thyromegaly present.  Cardiovascular: Normal rate and regular rhythm.   No murmur heard. Pulmonary/Chest: Effort normal and breath sounds normal.  Abdominal: Soft. Bowel sounds are normal. She exhibits no distension. There is no tenderness.  Musculoskeletal: Normal range of motion. She exhibits edema. She exhibits no tenderness.  2+ pretibial pitting edema bilaterally  Neurological: She is alert and oriented to person, place, and time. No cranial nerve deficit. Coordination normal.  Gait normal. DTRs symmetric bilaterally at knee jerk ankle jerk biceps toes downward going bilaterally  Skin: Skin is warm and dry. No rash noted.  Clean-appearing surgical wound over lower thoracic spine. Pain at lower thoracic spine upon getting up from supine position.  Psychiatric: She has a normal mood and affect.  Nursing note and vitals reviewed.   ED Course  Procedures (including critical care time) Labs Review Labs Reviewed - No data to display  Imaging Review No results found.   EKG Interpretation None     Bladder scan showed 131 milliliters urine bladder  8:35 PM patient requesting additional pain medicine. Additional intravenous hydromorphone ordered MRI scan discussed with Dr.Kearns and with Dr. Damien FusiElnser Results for orders placed or performed during the hospital encounter of 04/03/14  CBC with Differential/Platelet  Result Value Ref Range   WBC 8.9 4.0 - 10.5 K/uL   RBC 4.23 3.87 - 5.11 MIL/uL   Hemoglobin 13.5 12.0 - 15.0 g/dL   HCT 40.940.4 81.136.0 - 91.446.0 %   MCV 95.5 78.0 - 100.0 fL   MCH 31.9 26.0 - 34.0 pg   MCHC 33.4 30.0 - 36.0 g/dL   RDW 78.214.1 95.611.5 - 21.315.5 %   Platelets 319 150 - 400 K/uL   Neutrophils Relative % 75 43 - 77 %   Neutro Abs 6.7 1.7 - 7.7 K/uL   Lymphocytes Relative 16 12 - 46 %   Lymphs Abs 1.4 0.7 - 4.0 K/uL   Monocytes Relative 7 3 - 12 %   Monocytes Absolute 0.7 0.1 - 1.0 K/uL   Eosinophils Relative 2 0 - 5 %   Eosinophils  Absolute 0.1 0.0 - 0.7 K/uL   Basophils Relative 0 0 - 1 %   Basophils Absolute 0.0 0.0 - 0.1 K/uL  Basic metabolic panel  Result Value Ref Range   Sodium 137 135 - 145 mmol/L   Potassium 4.1 3.5 - 5.1 mmol/L   Chloride 102 96 - 112 mmol/L   CO2 25 19 - 32 mmol/L   Glucose, Bld 89 70 - 99 mg/dL   BUN 6 6 - 23 mg/dL   Creatinine, Ser 0.860.70 0.50 - 1.10 mg/dL   Calcium 8.8 8.4 - 57.810.5 mg/dL   GFR calc non Af Amer >90 >90 mL/min   GFR calc Af Amer >90 >90 mL/min   Anion gap 10 5 -  15  Urinalysis, Routine w reflex microscopic  Result Value Ref Range   Color, Urine YELLOW YELLOW   APPearance CLEAR CLEAR   Specific Gravity, Urine 1.010 1.005 - 1.030   pH 7.5 5.0 - 8.0   Glucose, UA NEGATIVE NEGATIVE mg/dL   Hgb urine dipstick NEGATIVE NEGATIVE   Bilirubin Urine NEGATIVE NEGATIVE   Ketones, ur NEGATIVE NEGATIVE mg/dL   Protein, ur NEGATIVE NEGATIVE mg/dL   Urobilinogen, UA 1.0 0.0 - 1.0 mg/dL   Nitrite NEGATIVE NEGATIVE   Leukocytes, UA NEGATIVE NEGATIVE  POC urine preg, ED (not at [facility])  Result Value Ref Range   Preg Test, Ur NEGATIVE NEGATIVE   Dg Chest 2 View  04/03/2014   CLINICAL DATA:  Initial encounter for cough and sore throat for 1 week after surgery.  EXAM: CHEST  2 VIEW  COMPARISON:  05/07/2011.  FINDINGS: The heart size and mediastinal contours are within normal limits. Both lungs are clear. The visualized skeletal structures are unremarkable.  IMPRESSION: No active cardiopulmonary disease.   Electronically Signed   By: Kennith Center M.D.   On: 04/03/2014 18:14   Dg Lumbar Spine 2-3 Views  03/28/2014   CLINICAL DATA:  Discectomy.  EXAM: LUMBAR SPINE - 2-3 VIEW  COMPARISON:  MRI of 01/18/2014.  FINDINGS: Lumbar vertebra are numbered as per prior MRI with lowest segmented appearing lumbar vertebra on lateral view is L5. Diffuse degenerative change. Mild stable compression fracture of T12 noted. Metallic marker is noted at the T11 vertebral body level.  IMPRESSION: Metallic marker  noted at the Q25 vertebral body level.   Electronically Signed   By: Maisie Fus  Register   On: 03/28/2014 09:27   Mr Thoracic Spine W Wo Contrast  04/03/2014   CLINICAL DATA:  Patient underwent back surgery 03/28/2014, now with continued pain and radiation to the legs. Patient reports severe weakness. No bowel or bladder difficulty reported.  EXAM: MRI THORACIC SPINE WITHOUT AND WITH CONTRAST  TECHNIQUE: Multiplanar and multiecho pulse sequences of the thoracic spine were obtained without and with intravenous contrast.  CONTRAST:  15mL MULTIHANCE GADOBENATE DIMEGLUMINE 529 MG/ML IV SOLN  COMPARISON:  MRI lumbar spine 01/18/2014.  FINDINGS: The patient is status post RIGHT T11-12 discectomy via a transpedicular approach. There is much less ventral mass effect from the disc herniation.  There is a small RIGHT-sided ventral epidural fluid collection 3 x 6 mm cross-section at the discectomy site, seen best on axial image 36 series 9, with slight mass effect on the thecal sac and spinal cord. It is unclear if this represents a benign fluid collection remaining in the dead space from discectomy or could potentially represent a small postoperative abscess. Residual disc material is not favored.  There are abnormal fluid collections at the operative site. In the subcutaneous soft tissues, there is a midline T2 hyperintense 14 x 15 x 28 mm collection with surrounding enhancement which could represent a seroma or abscess. At the laminectomy site, there is a second collection with slight mass effect on the spinal cord and dural sac measuring 15 x 14 x 28 mm also with mild surrounding enhancement. Dorsal epidural abscess is not excluded. No definite abnormal cord signal.  Schmorl's node projecting superiorly from T10-11. No endplate or discal enhancement is evident. No significant bone marrow edema at T11 or T12. No other thoracic disc herniations. Trace BILATERAL pleural effusions but no significant paravertebral fluid  collection.  IMPRESSION: Abnormal fluid collections at the incision site, within the transpedicular laminectomy site,  and ventrally at the site of discectomy status post RIGHT T11-12 discectomy performed 03/28/2014. Mild peripheral enhancement of both dorsal collections. Mild mass effect on the dural sac and spinal cord.  Subcutaneous abscess, and/or dorsal/ventral epidural abscesses not excluded, although certainly the fluid collections could represent simple postoperative fluid. Neurosurgical consultation is warranted. Findings discussed with ordering provider 8:39 p.m. EDT   Electronically Signed   By: Davonna Belling M.D.   On: 04/03/2014 20:48    MDM  Plan 23 hour observation medical surgical floor Final diagnoses:  None  Dx post operative pain #2 cough #3 peripheral edema #[id number]      Doug Sou, MD 04/03/14 2103

## 2014-04-04 LAB — C-REACTIVE PROTEIN: CRP: 2.9 mg/dL — ABNORMAL HIGH (ref <0.60)

## 2014-04-04 NOTE — Progress Notes (Signed)
Patient ID: Abigail Fieldingrystal G Dolliver, female   DOB: 06/22/1980, 34 y.o.   MRN: [id number] Patient notes that her pain remains a nadir 10. She's been started on high-dose Decadron. We'll see she does with this over the next 24 hours. My plan is to discharge her home tomorrow.

## 2014-04-04 NOTE — Progress Notes (Signed)
UR completed 

## 2014-04-04 NOTE — Progress Notes (Signed)
Patient is NPO and unsure of plan for today. She requests oral fluids and food. MD paged. Awaiting for call back.   Sim BoastHavy, RN

## 2014-04-05 MED ORDER — FAMOTIDINE 20 MG PO TABS
20.0000 mg | ORAL_TABLET | Freq: Two times a day (BID) | ORAL | Status: DC
  Administered 2014-04-05: 20 mg via ORAL
  Filled 2014-04-05: qty 1

## 2014-04-05 MED ORDER — OXYCODONE-ACETAMINOPHEN 10-325 MG PO TABS: 1.0000 | ORAL_TABLET | ORAL | Status: DC | PRN

## 2014-04-05 MED ORDER — DEXAMETHASONE 1 MG PO TABS: ORAL_TABLET | ORAL | Status: DC

## 2014-04-05 NOTE — Progress Notes (Signed)
Discharge instructions reviewed with patient/family. RXs given to patient. Per MD to give decadron IV prior to discharge but patient refused and wanted to go home now. Transport by family.   Sim BoastHavy, RN

## 2014-04-05 NOTE — Progress Notes (Signed)
Patient ID: Abigail Fieldingrystal G Spinola, female   DOB: 07/25/1980, 34 y.o.   MRN: [id number] Vital signs are stable Feels much improved on Decadron We'll discharge home today on oral Decadron taper

## 2014-04-05 NOTE — Discharge Summary (Signed)
Physician Discharge Summary  Patient ID: [name] MRN: [id number] DOB/AGE: 06/24/1980 33 y.o.  Admit date: 04/03/2014 Discharge date: 04/05/2014  Admission Diagnoses: Thoracic back pain status post T11-T12 discectomy  Discharge Diagnoses: Thoracic back pain status post T11-T12 discectomy. Active Problems:   Postoperative pain   Thoracic spondylosis   Discharged Condition: good  Hospital Course: Patient was admitted having had a discectomy week ago. She had worsening thoracic back pain not responsive to conservative treatment and using increasing doses of oxycodone for pain control. An MRI was obtained suggesting that the patient had a fluid collection in the surgical site questionable for infection. The patient had a normal white count with a modestly elevated sedimentation rate of 41 and a CRP of 2.9. She did not have any fevers. She was treated with some high dose Decadron which seemed to improve her pain considerably. At this time she is discharged home with a prescription for Decadron taper. She is also being converted over to Percocet 10/325 instead of oxycodone 15.  Consults: None  Significant Diagnostic Studies: As noted above  Treatments: IV Decadron  Discharge Exam: Blood pressure 141/79, pulse 59, temperature 98.7 F (37.1 C), temperature source Oral, resp. rate 18, height 5' 3.5" (1.613 m), weight 79.6 kg (175 lb 7.8 oz), last menstrual period 03/15/2014, SpO2 99 %. Her incision remains clean and dry. Her motor function is intact in the upper and lower extremities. Station and gait are intact  Disposition: 01-Home or Self Care  Discharge Instructions    Call MD for:  redness, tenderness, or signs of infection (pain, swelling, redness, odor or green/yellow discharge around incision site)    Complete by:  As directed      Call MD for:  severe uncontrolled pain    Complete by:  As directed      Call MD for:  temperature >100.4    Complete by:  As directed      Diet -  low sodium heart healthy    Complete by:  As directed      Increase activity slowly    Complete by:  As directed             Medication List    TAKE these medications        amitriptyline 10 MG tablet  Commonly known as:  ELAVIL  Take 2 tablets (20 mg total) by mouth at bedtime as needed for sleep.     dexamethasone 1 MG tablet  Commonly known as:  DECADRON  2 tablets twice daily for 2 days, one tablet twice daily for 2 days, one tablet daily for 2 days.     diazepam 5 MG tablet  Commonly known as:  VALIUM  Take 1 tablet (5 mg total) by mouth 3 (three) times daily as needed for anxiety or muscle spasms.     docusate sodium 100 MG capsule  Commonly known as:  COLACE  Take 1 capsule (100 mg total) by mouth 2 (two) times daily.     escitalopram 20 MG tablet  Commonly known as:  LEXAPRO  Take 1 tablet (20 mg total) by mouth daily.     fluticasone 50 MCG/ACT nasal spray  Commonly known as:  FLONASE  Place 2 sprays into both nostrils daily.     loratadine 10 MG tablet  Commonly known as:  CLARITIN  Take 1 tablet (10 mg total) by mouth daily.     oxyCODONE 15 MG immediate release tablet  Commonly known as:  ROXICODONE  Take 1 tablet (15 mg total) by mouth every 4 (four) hours as needed for severe pain.     oxyCODONE-acetaminophen 10-325 MG per tablet  Commonly known as:  PERCOCET  Take 1 tablet by mouth every 4 (four) hours as needed for pain.         SignedStefani Dama: [name] 04/05/2014, 10:16 AM

## 2014-04-29 ENCOUNTER — Ambulatory Visit: Payer: Medicaid Other | Admitting: Nurse Practitioner

## 2014-05-06 DIAGNOSIS — Z029 Encounter for administrative examinations, unspecified: Secondary | ICD-10-CM

## 2014-06-12 ENCOUNTER — Encounter: Payer: Self-pay | Admitting: Nurse Practitioner

## 2014-06-12 ENCOUNTER — Ambulatory Visit (INDEPENDENT_AMBULATORY_CARE_PROVIDER_SITE_OTHER): Payer: Medicaid Other | Admitting: Nurse Practitioner

## 2014-06-12 ENCOUNTER — Other Ambulatory Visit: Payer: Self-pay | Admitting: *Deleted

## 2014-06-12 ENCOUNTER — Telehealth: Payer: Self-pay | Admitting: *Deleted

## 2014-06-12 VITALS — BP 134/90 | Ht 62.0 in | Wt 175.2 lb

## 2014-06-12 DIAGNOSIS — F418 Other specified anxiety disorders: Secondary | ICD-10-CM

## 2014-06-12 DIAGNOSIS — F419 Anxiety disorder, unspecified: Principal | ICD-10-CM

## 2014-06-12 DIAGNOSIS — F329 Major depressive disorder, single episode, unspecified: Secondary | ICD-10-CM

## 2014-06-12 DIAGNOSIS — F410 Panic disorder [episodic paroxysmal anxiety] without agoraphobia: Secondary | ICD-10-CM | POA: Diagnosis not present

## 2014-06-12 MED ORDER — ESCITALOPRAM OXALATE 20 MG PO TABS: 20.0000 mg | ORAL_TABLET | Freq: Every day | ORAL | Status: DC

## 2014-06-12 MED ORDER — ALPRAZOLAM 1 MG PO TABS: 1.0000 mg | ORAL_TABLET | Freq: Three times a day (TID) | ORAL | Status: DC | PRN

## 2014-06-12 NOTE — Progress Notes (Signed)
Subjective:  Presents for recheck of her anxiety and depression. Taking Xanax 1 mg up to 3 times a day, takes on a when necessary basis. Having some panic attacks. Minimal improvement on Lexapro. Could not take amitriptyline, caused insomnia. Has been told by one mental health provider that she has bipolar disorder. Otherwise has been diagnosed with anxiety and depression. Has applied for disability for her chronic back pain as well as her anxiety and depression. Will be seeing a psychiatrist for her disability in the near future.  Objective:   BP 134/90 mmHg  Ht 5\' 2"  (1.575 m)  Wt 175 lb 4 oz (79.493 kg)  BMI 32.05 kg/m2  LMP 06/04/2014 NAD. Alert, oriented. Thoughts logical coherent and relevant. Cheerful affect. Dressed appropriately. Lungs clear. Heart regular rate rhythm.  Assessment:  Problem List Items Addressed This Visit      Other   Anxiety and depression - Primary   Relevant Orders   Ambulatory referral to Psychiatry   Panic attacks   Relevant Medications   ALPRAZolam (XANAX) 1 MG tablet   escitalopram (LEXAPRO) 20 MG tablet   Other Relevant Orders   Ambulatory referral to Psychiatry     Plan:  Meds ordered this encounter  Medications  . DISCONTD: ALPRAZolam (XANAX) 1 MG tablet    Sig: Take 1 mg by mouth 3 (three) times daily as needed for anxiety.  . ALPRAZolam (XANAX) 1 MG tablet    Sig: Take 1 tablet (1 mg total) by mouth 3 (three) times daily as needed for anxiety.    Dispense:  90 tablet    Refill:  2    May refill monthly    Order Specific Question:  Supervising Provider    Answer:  Merlyn AlbertLUKING, WILLIAM S [[id number]]  . escitalopram (LEXAPRO) 20 MG tablet    Sig: Take 1 tablet (20 mg total) by mouth daily.    Dispense:  30 tablet    Refill:  5    Order Specific Question:  Supervising Provider    Answer:  Merlyn AlbertLUKING, WILLIAM S [[id number]]   After the visit, pharmacy notified us that patient is taking diazepam, prescribed up to 50 per month. Patient was contacted, she has to  choose between the Xanax and Valium, she chose to continue the Xanax. Advised patient not to take medications together. Refer to psychiatry for further evaluation. Continue current medications. Callback in the meantime if any problems.

## 2014-06-12 NOTE — Telephone Encounter (Signed)
Don from [facility] calling to report pt has been [facility] and diazep[facility] from another physician. Discuss with pt. Pt states the diazepam does not help and she would like the rx for the alprazolam. Consult with [facility]. Ok to fill alprazolam but cancel refills on the diazepam. Pt notified she cannot take both. Pt verbalized understand and she states she will only take alprazolam.

## 2014-07-04 ENCOUNTER — Encounter: Payer: Self-pay | Admitting: Family Medicine

## 2014-07-12 ENCOUNTER — Emergency Department (HOSPITAL_COMMUNITY): Payer: Medicaid Other

## 2014-07-12 ENCOUNTER — Encounter (HOSPITAL_COMMUNITY): Payer: Self-pay | Admitting: Emergency Medicine

## 2014-07-12 ENCOUNTER — Emergency Department (HOSPITAL_COMMUNITY)
Admission: EM | Admit: 2014-07-12 | Discharge: 2014-07-12 | Disposition: A | Discharge status: Home / Self Care | Payer: Medicaid Other | Attending: Emergency Medicine | Admitting: Emergency Medicine

## 2014-07-12 DIAGNOSIS — Y9389 Activity, other specified: Secondary | ICD-10-CM | POA: Diagnosis not present

## 2014-07-12 DIAGNOSIS — G8929 Other chronic pain: Secondary | ICD-10-CM | POA: Diagnosis not present

## 2014-07-12 DIAGNOSIS — Y998 Other external cause status: Secondary | ICD-10-CM | POA: Insufficient documentation

## 2014-07-12 DIAGNOSIS — Z8669 Personal history of other diseases of the nervous system and sense organs: Secondary | ICD-10-CM | POA: Insufficient documentation

## 2014-07-12 DIAGNOSIS — F329 Major depressive disorder, single episode, unspecified: Secondary | ICD-10-CM | POA: Insufficient documentation

## 2014-07-12 DIAGNOSIS — Z87442 Personal history of urinary calculi: Secondary | ICD-10-CM | POA: Insufficient documentation

## 2014-07-12 DIAGNOSIS — S3992XA Unspecified injury of lower back, initial encounter: Secondary | ICD-10-CM | POA: Insufficient documentation

## 2014-07-12 DIAGNOSIS — Z9889 Other specified postprocedural states: Secondary | ICD-10-CM | POA: Insufficient documentation

## 2014-07-12 DIAGNOSIS — F419 Anxiety disorder, unspecified: Secondary | ICD-10-CM | POA: Diagnosis not present

## 2014-07-12 DIAGNOSIS — Z765 Malingerer [conscious simulation]: Secondary | ICD-10-CM

## 2014-07-12 DIAGNOSIS — Y9289 Other specified places as the place of occurrence of the external cause: Secondary | ICD-10-CM | POA: Diagnosis not present

## 2014-07-12 DIAGNOSIS — Z720 Tobacco use: Secondary | ICD-10-CM | POA: Insufficient documentation

## 2014-07-12 DIAGNOSIS — M546 Pain in thoracic spine: Secondary | ICD-10-CM

## 2014-07-12 DIAGNOSIS — W1839XA Other fall on same level, initial encounter: Secondary | ICD-10-CM | POA: Diagnosis not present

## 2014-07-12 DIAGNOSIS — Z8709 Personal history of other diseases of the respiratory system: Secondary | ICD-10-CM | POA: Insufficient documentation

## 2014-07-12 DIAGNOSIS — Z880 Allergy status to penicillin: Secondary | ICD-10-CM | POA: Insufficient documentation

## 2014-07-12 MED ORDER — MORPHINE SULFATE 4 MG/ML IJ SOLN: 8.0000 mg | Freq: Once | INTRAMUSCULAR | Status: DC

## 2014-07-12 MED ORDER — MORPHINE SULFATE 4 MG/ML IJ SOLN
8.0000 mg | Freq: Once | INTRAMUSCULAR | Status: AC
  Administered 2014-07-12: 8 mg via INTRAMUSCULAR
  Filled 2014-07-12: qty 2

## 2014-07-12 MED ORDER — HYDROMORPHONE HCL 2 MG/ML IJ SOLN
1.5000 mg | Freq: Once | INTRAMUSCULAR | Status: AC
  Administered 2014-07-12: 1.5 mg via INTRAMUSCULAR
  Filled 2014-07-12: qty 1

## 2014-07-12 MED ORDER — DIAZEPAM 5 MG PO TABS
5.0000 mg | ORAL_TABLET | Freq: Once | ORAL | Status: AC
  Administered 2014-07-12: 5 mg via ORAL
  Filled 2014-07-12: qty 1

## 2014-07-12 MED ORDER — KETOROLAC TROMETHAMINE 30 MG/ML IJ SOLN
15.0000 mg | Freq: Once | INTRAMUSCULAR | Status: AC
  Administered 2014-07-12: 15 mg via INTRAMUSCULAR
  Filled 2014-07-12: qty 1

## 2014-07-12 MED ORDER — DEXAMETHASONE 4 MG PO TABS
12.0000 mg | ORAL_TABLET | Freq: Once | ORAL | Status: AC
  Administered 2014-07-12: 12 mg via ORAL
  Filled 2014-07-12: qty 3

## 2014-07-12 MED ORDER — DIPHENHYDRAMINE HCL 25 MG PO CAPS
25.0000 mg | ORAL_CAPSULE | Freq: Once | ORAL | Status: AC
  Administered 2014-07-12: 25 mg via ORAL
  Filled 2014-07-12: qty 1

## 2014-07-12 NOTE — ED Notes (Signed)
Pt had back surgery in March, upper lumbar/thoracic area. Pt fell backwards yesterday when she lost her balance. Pt reports continuing back pain since surgery, worse since fall.

## 2014-07-12 NOTE — ED Notes (Signed)
Patient stating pain is so bad she can not move at all, needs full assistance to restroom and with other ADLs moved to room 03. Mardene CelesteJoanna, charge nurse given report and informed of patients arrival

## 2014-07-12 NOTE — ED Provider Notes (Signed)
CSN: [id number]     Arrival date & time 07/12/14  4098 History  This chart was scribed for Raeford Razor, MD by Tanda Rockers, ED Scribe. This patient was seen in room APA03/APA03 and the patient's care was started at 11:42 AM.  Chief Complaint  Patient presents with  . Back Pain   The history is provided by the patient. No language interpreter was used.     HPI Comments: [name] is a 34 y.o. female who presents to the Emergency Department complaining of sudden onset back pain radiating down bilateral lower extremities, right great than left, that began last night s/p ground level fall. The pain is exacerbated with deep breathing. She also notes numbness to right hip and right knee down to her ankle. Pt states she is able to feel sensation in her right thigh. She also complains of weakness in lower extremities due to pain. Pt denies urinary changes, fever, or any other symptoms. Pt had thoracic discectomy on 03/28/2014 and states her pain is around that area.    Past Medical History  Diagnosis Date  . Chronic back pain     herniated disc  . Chronic pelvic pain in female   . History of bronchitis     yrs ago  . Seasonal allergies     CLaritin and FLonase daily as needed  . Headache     constant  . History of kidney stones   . Nocturia   . Depression     takes Lexapro daily  . Anxiety     takes Xanax daily as needed  . Insomnia     takes Elavil nightly   Past Surgical History  Procedure Laterality Date  . Cholecystectomy    . Tubal ligation    . Thoracic discectomy Right 03/28/2014    Procedure: RIGHT THORACIC ELEVEN-TWELVE MICRODICSECTOMY;  Surgeon: Tressie Stalker, MD;  Location: [facility];  Service: Neurosurgery;  Laterality: Right;  right   Family History  Problem Relation Age of Onset  . Hypertension Mother   . Hypertension Father   . Hypertension Sister   . Stroke Other   . Cancer Other    History  Substance Use Topics  . Smoking status: Current Every  Day Smoker -- 0.50 packs/day for 5 years    Types: Cigarettes  . Smokeless tobacco: Never Used  . Alcohol Use: No   OB History    Gravida Para Term Preterm AB TAB SAB Ectopic Multiple Living   Review of Systems  Constitutional: Negative for fever.  Genitourinary: Negative for difficulty urinating.  Musculoskeletal: Positive for back pain and arthralgias (Bilateral lower extremities).  Neurological: Positive for weakness and numbness.  All other systems reviewed and are negative.     Allergies  Motrin; Hydrocodone; Penicillins; Ultram; and Aspirin  Home Medications   Prior to Admission medications   Medication Sig Start Date End Date Taking? Authorizing Provider  ALPRAZolam Prudy Feeler) 1 MG tablet Take 1 tablet (1 mg total) by mouth 3 (three) times daily as needed for anxiety. 06/12/14  Yes Campbell Riches, NP  amitriptyline (ELAVIL) 10 MG tablet Take 2 tablets (20 mg total) by mouth at bedtime as needed for sleep. Patient not taking: Reported on 06/12/2014 02/07/14   Campbell Riches, NP  docusate sodium (COLACE) 100 MG capsule Take 1 capsule (100 mg total) by mouth 2 (two) times daily. Patient not taking: Reported on 06/12/2014  03/28/14   Tressie Stalker, MD  escitalopram (LEXAPRO) 20 MG tablet Take 1 tablet (20 mg total) by mouth daily. Patient not taking: Reported on 07/12/2014 06/12/14 06/12/15  Campbell Riches, NP  fluticasone [facility]) 50 MCG/ACT nasal spray Place 2 sprays into both nostrils daily. Patient taking differently: Place 2 sprays into both nostrils daily as needed for allergies.  11/26/13   Campbell Riches, NP  loratadine (CLARITIN) 10 MG tablet Take 1 tablet (10 mg total) by mouth daily. Patient taking differently: Take 10 mg by mouth daily as needed for allergies.  11/26/13   Campbell Riches, NP  oxyCODONE (ROXICODONE) 15 MG immediate release tablet Take 1 tablet (15 mg total) by mouth every 4 (four) hours as needed for severe pain. Patient not taking:  Reported on 07/12/2014 03/28/14   Tressie Stalker, MD  oxyCODONE-acetaminophen (PERCOCET) 10-325 MG per tablet Take 1 tablet by mouth every 4 (four) hours as needed for pain. Patient not taking: Reported on 06/12/2014 04/05/14   Barnett Abu, MD   Triage Vitals: BP 131/97 mmHg  Pulse 85  Temp(Src) 98.8 F (37.1 C) (Oral)  Resp 16  Ht 5\' 3"  (1.6 m)  Wt 170 lb (77.111 kg)  BMI 30.12 kg/m2  SpO2 100%  LMP 07/04/2014   Physical Exam  Constitutional: She is oriented to person, place, and time. She appears well-developed and well-nourished. No distress.  HENT:  Head: Normocephalic and atraumatic.  Eyes: Conjunctivae and EOM are normal.  Neck: Neck supple. No tracheal deviation present.  Cardiovascular: Normal rate.   Pulmonary/Chest: Effort normal. No respiratory distress.  Musculoskeletal: Normal range of motion.       Arms: Tender to palpation in pictured area with even just very light palpation of skin.  Neurological: She is alert and oriented to person, place, and time. She exhibits normal muscle tone. Coordination normal.  Strength normal b/l LE. Sensation intact to light touch.   Skin: Skin is warm and dry.  Psychiatric: She has a normal mood and affect. Her behavior is normal.  Nursing note and vitals reviewed.   ED Course  Procedures (including critical care time)  DIAGNOSTIC STUDIES: Oxygen Saturation is 100% on RA, normal by my interpretation.    COORDINATION OF CARE: 11:46 AM-Discussed treatment plan which includes DG L Spine and DG T Spine with pt at bedside and pt agreed to plan.   Labs Review Labs Reviewed - No data to display  Imaging Review No results found.   EKG Interpretation None      MDM   Final diagnoses:  Thoracic back pain, unspecified back pain laterality  Chronic pain  Drug-seeking behavior    34yF with mid/lower back pain. Refusing x-ray. Insists she needs MRI. She probably needs neither. She does have neurolgical complaints, but clinically  they don't make sense (Circumfrential numbness from R knee distally, thigh feels normal then numbness to R hip/groin region. Says she cannot move RLE but will withdraw it to pain. Walking at home but needing total assist in ED, etc). I suspect this musculoskeletal pain and doubt cord injury. There are many discrepancies between my understanding of human physiology and her reported symptoms. Suspect some anxiety component. Will treat symptoms.   [place] Controlled Substance Database Reviewed. Since 05/08/2014, pt has been prescribed 100 tablets 15mg  oxycodone, 200 tablets 10 mg oxycodone, 180 tablets 1mg  tablets of alprazolam, and 150 tablets of 5 mg diazepam.  She has been getting similar large quantities of these medications since at least the beginning  of the year. She reported to me she has only been taking tylenol for pain.   Patient has more pain and anxiety than I can effectively treat in the emergency room. Recommended that she seek care with a pain management provider. She is unhappy with this and reports that "I'll just be back." She received 8 mg morphine, 1.5 mg dilaudid, 5 mg valium, 15 mg toradol, 12 mg decadron in the ED today.    Raeford RazorStephen Chimaobi Casebolt, MD 07/19/14 [id number]

## 2014-07-12 NOTE — Discharge Instructions (Signed)
Back Pain, Adult Low back pain is very common. About 1 in 5 people have back pain.The cause of low back pain is rarely dangerous. The pain often gets better over time.About half of people with a sudden onset of back pain feel better in just 2 weeks. About 8 in 10 people feel better by 6 weeks.  CAUSES Some common causes of back pain include:  Strain of the muscles or ligaments supporting the spine.  Wear and tear (degeneration) of the spinal discs.  Arthritis.  Direct injury to the back. DIAGNOSIS Most of the time, the direct cause of low back pain is not known.However, back pain can be treated effectively even when the exact cause of the pain is unknown.Answering your caregiver's questions about your overall health and symptoms is one of the most accurate ways to make sure the cause of your pain is not dangerous. If your caregiver needs more information, he or she may order lab work or imaging tests (X-rays or MRIs).However, even if imaging tests show changes in your back, this usually does not require surgery. HOME CARE INSTRUCTIONS For many people, back pain returns.Since low back pain is rarely dangerous, it is often a condition that people can learn to manageon their own.   Remain active. It is stressful on the back to sit or stand in one place. Do not sit, drive, or stand in one place for more than 30 minutes at a time. Take short walks on level surfaces as soon as pain allows.Try to increase the length of time you walk each day.  Do not stay in bed.Resting more than 1 or 2 days can delay your recovery.  Do not avoid exercise or work.Your body is made to move.It is not dangerous to be active, even though your back may hurt.Your back will likely heal faster if you return to being active before your pain is gone.  Pay attention to your body when you bend and lift. Many people have less discomfortwhen lifting if they bend their knees, keep the load close to their bodies,and  avoid twisting. Often, the most comfortable positions are those that put less stress on your recovering back.  Find a comfortable position to sleep. Use a firm mattress and lie on your side with your knees slightly bent. If you lie on your back, put a pillow under your knees.  Only take over-the-counter or prescription medicines as directed by your caregiver. Over-the-counter medicines to reduce pain and inflammation are often the most helpful.Your caregiver may prescribe muscle relaxant drugs.These medicines help dull your pain so you can more quickly return to your normal activities and healthy exercise.  Put ice on the injured area.  Put ice in a plastic bag.  Place a towel between your skin and the bag.  Leave the ice on for 15-20 minutes, 03-04 times a day for the first 2 to 3 days. After that, ice and heat may be alternated to reduce pain and spasms.  Ask your caregiver about trying back exercises and gentle massage. This may be of some benefit.  Avoid feeling anxious or stressed.Stress increases muscle tension and can worsen back pain.It is important to recognize when you are anxious or stressed and learn ways to manage it.Exercise is a great option. SEEK MEDICAL CARE IF:  You have pain that is not relieved with rest or medicine.  You have pain that does not improve in 1 week.  You have new symptoms.  You are generally not feeling well. SEEK   IMMEDIATE MEDICAL CARE IF:   You have pain that radiates from your back into your legs.  You develop new bowel or bladder control problems.  You have unusual weakness or numbness in your arms or legs.  You develop nausea or vomiting.  You develop abdominal pain.  You feel faint. Document Released: 12/21/2004 Document Revised: 06/22/2011 Document Reviewed: 04/24/2013 Exit[employer] Patient Information 2015 [employer]. This information is not intended to replace advice given to you by your health care provider. Make sure you  discuss any questions you have with your health care provider.  

## 2014-07-12 NOTE — ED Notes (Signed)
Discharge instructions reviewed with pt, questions answered. Pt verbalized understanding.  

## 2014-07-29 ENCOUNTER — Telehealth (HOSPITAL_COMMUNITY): Payer: Self-pay | Admitting: *Deleted

## 2014-09-06 ENCOUNTER — Emergency Department (HOSPITAL_COMMUNITY)
Admission: EM | Admit: 2014-09-06 | Discharge: 2014-09-06 | Disposition: A | Discharge status: Home / Self Care | Payer: Medicaid Other | Attending: Emergency Medicine | Admitting: Emergency Medicine

## 2014-09-06 ENCOUNTER — Encounter (HOSPITAL_COMMUNITY): Payer: Self-pay | Admitting: Emergency Medicine

## 2014-09-06 DIAGNOSIS — Z8709 Personal history of other diseases of the respiratory system: Secondary | ICD-10-CM | POA: Diagnosis not present

## 2014-09-06 DIAGNOSIS — F329 Major depressive disorder, single episode, unspecified: Secondary | ICD-10-CM | POA: Diagnosis not present

## 2014-09-06 DIAGNOSIS — G4700 Insomnia, unspecified: Secondary | ICD-10-CM | POA: Insufficient documentation

## 2014-09-06 DIAGNOSIS — G8929 Other chronic pain: Secondary | ICD-10-CM | POA: Insufficient documentation

## 2014-09-06 DIAGNOSIS — M5442 Lumbago with sciatica, left side: Secondary | ICD-10-CM | POA: Diagnosis not present

## 2014-09-06 DIAGNOSIS — Z880 Allergy status to penicillin: Secondary | ICD-10-CM | POA: Insufficient documentation

## 2014-09-06 DIAGNOSIS — Z8742 Personal history of other diseases of the female genital tract: Secondary | ICD-10-CM | POA: Diagnosis not present

## 2014-09-06 DIAGNOSIS — Z720 Tobacco use: Secondary | ICD-10-CM | POA: Diagnosis not present

## 2014-09-06 DIAGNOSIS — M545 Low back pain: Secondary | ICD-10-CM | POA: Diagnosis present

## 2014-09-06 DIAGNOSIS — Z87442 Personal history of urinary calculi: Secondary | ICD-10-CM | POA: Insufficient documentation

## 2014-09-06 DIAGNOSIS — F419 Anxiety disorder, unspecified: Secondary | ICD-10-CM | POA: Diagnosis not present

## 2014-09-06 MED ORDER — KETOROLAC TROMETHAMINE 60 MG/2ML IM SOLN
60.0000 mg | Freq: Once | INTRAMUSCULAR | Status: AC
  Administered 2014-09-06: 60 mg via INTRAMUSCULAR
  Filled 2014-09-06: qty 2

## 2014-09-06 NOTE — Discharge Instructions (Signed)

## 2014-09-06 NOTE — ED Notes (Signed)
Pt to triage via [facility].  Pt reports back surgery in March.  States she fell approx 2 weeks ago and reports lower back that radiates down L leg.  Also c/o swelling to bilateral ankles.  States she saw Dr. Lovell Sheehan (neurosurgeon) today for same and was referred back to PCP.  States she is unable to take hydrocodone that he prescribed 1 week ago due allergy to it.

## 2014-09-07 NOTE — ED Provider Notes (Signed)
CSN: [id number]     Arrival date & time 09/06/14  2031 History   First MD Initiated Contact with Patient 09/06/14 2145     Chief Complaint  Patient presents with  . Back Pain     (Consider location/radiation/quality/duration/timing/severity/associated sxs/prior Treatment) Patient is a 34 y.o. female presenting with back pain.  Back Pain Location:  Lumbar spine Quality:  Stabbing Radiates to:  L foot Pain severity:  Severe Onset quality:  Gradual Duration: several months, worse over past several weeks. Timing:  Constant Progression:  Worsening Chronicity:  Recurrent Context comment:  Saw her back specialist today regarding her back pain and left leg symptoms.  Was reportedly referred back to her PCP.  Relieved by:  Nothing Worsened by:  Movement and palpation Ineffective treatments: acetaminophen. Associated symptoms: tingling and weakness   Associated symptoms: no bladder incontinence, no bowel incontinence and no fever   Associated symptoms comment:  Bilateral lower extremity swelling.   Past Medical History  Diagnosis Date  . Chronic back pain     herniated disc  . Chronic pelvic pain in female   . History of bronchitis     yrs ago  . Seasonal allergies     CLaritin and FLonase daily as needed  . Headache     constant  . History of kidney stones   . Nocturia   . Depression     takes Lexapro daily  . Anxiety     takes Xanax daily as needed  . Insomnia     takes Elavil nightly   Past Surgical History  Procedure Laterality Date  . Cholecystectomy    . Tubal ligation    . Thoracic discectomy Right 03/28/2014    Procedure: RIGHT THORACIC ELEVEN-TWELVE MICRODICSECTOMY;  Surgeon: Tressie Stalker, MD;  Location: [facility];  Service: Neurosurgery;  Laterality: Right;  right   Family History  Problem Relation Age of Onset  . Hypertension Mother   . Hypertension Father   . Hypertension Sister   . Stroke Other   . Cancer Other    Social History  Substance Use  Topics  . Smoking status: Current Every Day Smoker -- 0.50 packs/day for 5 years    Types: Cigarettes  . Smokeless tobacco: Never Used  . Alcohol Use: No   OB History    Gravida Para Term Preterm AB TAB SAB Ectopic Multiple Living   Review of Systems  Constitutional: Negative for fever.  Gastrointestinal: Negative for bowel incontinence.  Genitourinary: Negative for bladder incontinence.  Musculoskeletal: Positive for back pain.  Neurological: Positive for tingling and weakness.  All other systems reviewed and are negative.     Allergies  Motrin; Hydrocodone; Penicillins; Ultram; and Aspirin  Home Medications   Prior to Admission medications   Medication Sig Start Date End Date Taking? Authorizing Provider  ALPRAZolam Prudy Feeler) 1 MG tablet Take 1 tablet (1 mg total) by mouth 3 (three) times daily as needed for anxiety. 06/12/14  Yes Campbell Riches, NP  amitriptyline (ELAVIL) 10 MG tablet Take 2 tablets (20 mg total) by mouth at bedtime as needed for sleep. Patient not taking: Reported on 06/12/2014 02/07/14   Campbell Riches, NP  escitalopram (LEXAPRO) 20 MG tablet Take 1 tablet (20 mg total) by mouth daily. Patient not taking: Reported on 07/12/2014 06/12/14 06/12/15  Campbell Riches, NP  HYDROcodone-acetaminophen [facility]) 10-325 MG per tablet Take 1 tablet by mouth every  6 (six) hours as needed. 08/30/14   Historical Provider, MD   BP 121/84 mmHg  Pulse 87  Temp(Src) 99.3 F (37.4 C) (Oral)  Resp 18  Ht 5\' 3"  (1.6 m)  Wt 173 lb (78.472 kg)  BMI 30.65 kg/m2  SpO2 100%  LMP 08/07/2014 Physical Exam  Constitutional: She is oriented to person, place, and time. She appears well-developed and well-nourished. No distress.  HENT:  Head: Normocephalic and atraumatic.  Mouth/Throat: Oropharynx is clear and moist.  Eyes: Conjunctivae are normal. Pupils are equal, round, and reactive to light. No scleral icterus.  Neck: Neck supple.  Cardiovascular: Normal rate,  regular rhythm, normal heart sounds and intact distal pulses.   No murmur heard. Pulmonary/Chest: Effort normal and breath sounds normal. No stridor. No respiratory distress. She has no rales.  Abdominal: Soft. Bowel sounds are normal. She exhibits no distension. There is no tenderness.  Musculoskeletal: Normal range of motion. She exhibits edema (trace symmetric bilateral ankle edema) and tenderness (hyperalgesic with palpation from low back to left foot.  ).       Back:  2+ DP pulses bilaterally  Neurological: She is alert and oriented to person, place, and time.  Neurologic exam severely limited by effort.  When distracted, pt appeared to have good strength in both legs.    Skin: Skin is warm and dry. No rash noted. No erythema.  Psychiatric: She has a normal mood and affect. Her behavior is normal.  Nursing note and vitals reviewed.   ED Course  Procedures (including critical care time) Labs Review Labs Reviewed - No data to display  Imaging Review No results found. I have personally reviewed and evaluated these images and lab results as part of my medical decision-making.   EKG Interpretation None      MDM   Final diagnoses:  Left-sided low back pain with left-sided sciatica    34 yo female with chronic back pain presenting with worsening back pain over past several weeks to months with pain radiating to her left leg to her foot.  Her history and exam was limited by her cooperation with both.  She was tearful at the beginning of our conversation and her demeanor immediately changed once we started talking about pain medications.  She has been seen in the past and providers have been concerned about drug seeking behavior.  In the ED, her pain was treated with toradol, which she has tolerated in the past.  Given her recent specialist evaluation, the chronic nature of her symptoms, and her distractible exam, I do not think she needs emergent MR imaging of her back.    Regarding  her leg swelling, she states this has been present since her back surgery 6 months ago.  On exam, she has only trace edema which is symmetric, normal pulses.  Her presentation and exam are not consistent with DVT.  She has follow up with her PCP within a week for recheck.      Blake Divine, MD 09/07/14 [phone]

## 2014-09-11 ENCOUNTER — Other Ambulatory Visit: Payer: Self-pay | Admitting: Nurse Practitioner

## 2014-09-12 ENCOUNTER — Ambulatory Visit (INDEPENDENT_AMBULATORY_CARE_PROVIDER_SITE_OTHER): Payer: Medicaid Other | Admitting: Nurse Practitioner

## 2014-09-12 ENCOUNTER — Encounter: Payer: Self-pay | Admitting: Nurse Practitioner

## 2014-09-12 VITALS — BP 114/86 | Ht 62.0 in | Wt 169.4 lb

## 2014-09-12 DIAGNOSIS — F419 Anxiety disorder, unspecified: Principal | ICD-10-CM

## 2014-09-12 DIAGNOSIS — F329 Major depressive disorder, single episode, unspecified: Secondary | ICD-10-CM

## 2014-09-12 DIAGNOSIS — G4700 Insomnia, unspecified: Secondary | ICD-10-CM

## 2014-09-12 DIAGNOSIS — F418 Other specified anxiety disorders: Secondary | ICD-10-CM | POA: Diagnosis not present

## 2014-09-12 MED ORDER — ESCITALOPRAM OXALATE 10 MG PO TABS: 10.0000 mg | ORAL_TABLET | Freq: Every day | ORAL | Status: DC

## 2014-09-12 MED ORDER — ALPRAZOLAM 1 MG PO TABS: 1.0000 mg | ORAL_TABLET | Freq: Three times a day (TID) | ORAL | Status: DC | PRN

## 2014-09-12 NOTE — Patient Instructions (Signed)
Melatonin 5 mg at bedtime

## 2014-09-12 NOTE — Progress Notes (Signed)
Subjective:  Presents for routine follow-up. Has decreased her Lexapro dose to 10 mg which is working well. Take her Xanax daily which helps breakthrough anxiety. Is not taking amitriptyline. Continues to have some difficulty sleeping at times.  Objective:   BP 114/86 mmHg  Ht  (1.575 m)  Wt 169 lb 6 oz (76.828 kg)  BMI 30.97 kg/m2  LMP 08/07/2014 NAD. Alert, oriented. Cheerful affect. Thoughts logical coherent and relevant. Lungs clear. Heart regular rate rhythm.  Assessment:  Problem List Items Addressed This Visit      Other   Anxiety and depression - Primary   Insomnia     Plan:  Meds ordered this encounter  Medications  . DISCONTD: escitalopram (LEXAPRO) 10 MG tablet    Sig: Take 10 mg by mouth daily.  Marland Kitchen escitalopram (LEXAPRO) 10 MG tablet    Sig: Take 1 tablet (10 mg total) by mouth daily.    Dispense:  30 tablet    Refill:  5    Order Specific Question:  Supervising Provider    Answer:  Merlyn Albert [[id number]]  . ALPRAZolam (XANAX) 1 MG tablet    Sig: Take 1 tablet (1 mg total) by mouth 3 (three) times daily as needed for anxiety.    Dispense:  90 tablet    Refill:  5    May refill monthly; please cancel other Xanax prescriptions; [facility]    Order Specific Question:  Supervising Provider    Answer:  Merlyn Albert [[id number]]    Continue current medications as directed. Return in about 6 months (around 03/12/2015) for recheck. Call back sooner if needed.

## 2014-09-30 ENCOUNTER — Encounter: Payer: Self-pay | Admitting: Family Medicine

## 2014-11-07 ENCOUNTER — Telehealth: Payer: Self-pay | Admitting: Family Medicine

## 2014-11-07 NOTE — Telephone Encounter (Signed)
Pt was seen at place in [place] called [facility] She ws issued labs that are in your results folder that will need  To be addressed due to abnormal results

## 2014-12-10 ENCOUNTER — Encounter: Payer: Self-pay | Admitting: Nurse Practitioner

## 2014-12-10 ENCOUNTER — Ambulatory Visit (INDEPENDENT_AMBULATORY_CARE_PROVIDER_SITE_OTHER): Payer: Medicaid Other | Admitting: Nurse Practitioner

## 2014-12-10 VITALS — BP 124/78 | Ht 62.0 in | Wt 189.6 lb

## 2014-12-10 DIAGNOSIS — J029 Acute pharyngitis, unspecified: Secondary | ICD-10-CM | POA: Diagnosis not present

## 2014-12-10 DIAGNOSIS — B1710 Acute hepatitis C without hepatic coma: Secondary | ICD-10-CM | POA: Diagnosis not present

## 2014-12-10 DIAGNOSIS — R1011 Right upper quadrant pain: Secondary | ICD-10-CM | POA: Diagnosis not present

## 2014-12-10 DIAGNOSIS — R748 Abnormal levels of other serum enzymes: Secondary | ICD-10-CM

## 2014-12-10 LAB — POCT RAPID STREP A (OFFICE): RAPID STREP A SCREEN: NEGATIVE

## 2014-12-10 MED ORDER — RANITIDINE HCL 300 MG PO TABS: ORAL_TABLET | ORAL | Status: DC

## 2014-12-10 MED ORDER — ONDANSETRON 8 MG PO TBDP: 8.0000 mg | ORAL_TABLET | Freq: Three times a day (TID) | ORAL | Status: DC | PRN

## 2014-12-10 NOTE — Progress Notes (Signed)
Subjective:  Presents to discuss her recent labs done through [facility]. Denies any alcohol use. Has stopped Tylenol since labs. No OTC meds or supplements. States her husband tested neg for Hep C. Frequent nausea. No fever. Abdominal pain especially RUQ. Has had a cholecystectomy. Acid reflux about twice per week. At the end of the visit, as provider was leaving the room, patient mentioned having a sore throat for about a week. Mainly in AM. Taking fluids well. Mild head congestion. Mentions hurting "all over". Requesting something for pain. When offered to send her to pain management, patient states she does not need continuous medication. Review of the chart shows she is taking Xanax 1 mg 3 times a day. Explained to patient the new guidelines recommend we avoid using Xanax with pain medication. Patient states she needs her Xanax and defers a medication at this point. Patient was tearing up during office visit but this immediately stopped after this discussion.  Objective:   BP 124/78 mmHg  Ht 5\' 2"  (1.575 m)  Wt 189 lb 9.6 oz (86.002 kg)  BMI 34.67 kg/m2 NAD. Alert, oriented. TMs clear effusion, no erythema. Pharynx minimal erythema. Neck supple with mild soft anterior adenopathy. Lungs clear. Heart regular rate rhythm. Abdomen soft nondistended with moderate epigastric area tenderness more so in the right upper quadrant. Some guarding. Difficulty assessing for mass or hepatomegaly. RST neg. Lab work dated 10/31/2014 shows a reactive hep C antibody and elevated AST 93 and ALT 93.    Assessment: Right upper quadrant pain - Plan: Hepatic function panel, US Abdomen Complete  Abnormal liver enzymes - Plan: Hepatic function panel, US Abdomen Complete  Acute hepatitis C virus infection without hepatic coma - Plan: Hepatic function panel, US Abdomen Complete  Acute pharyngitis, unspecified etiology - Plan: Strep A DNA probe, POCT rapid strep A  Plan:  Meds ordered this encounter   Medications  . ondansetron (ZOFRAN-ODT) 8 MG disintegrating tablet    Sig: Take 1 tablet (8 mg total) by mouth every 8 (eight) hours as needed for nausea or vomiting.    Dispense:  30 tablet    Refill:  0    Order Specific Question:  Supervising Provider    Answer:  Merlyn AlbertLUKING, WILLIAM S [[id number]]  . ranitidine (ZANTAC) 300 MG tablet    Sig: One po qd prn acid reflux    Dispense:  30 tablet    Refill:  5    Order Specific Question:  Supervising Provider    Answer:  Merlyn AlbertLUKING, WILLIAM S [[id number]]   Labs and ultrasound pending. Will refer for evaluation of hep C and abdominal pain. Warning signs reviewed. Patient to seek help immediately if any severe symptoms.

## 2014-12-11 LAB — HEPATIC FUNCTION PANEL
ALBUMIN: 4.1 g/dL (ref 3.5–5.5)
ALT: 12 IU/L (ref 0–32)
AST: 13 IU/L (ref 0–40)
Alkaline Phosphatase: 65 IU/L (ref 39–117)
Bilirubin Total: <0.2 mg/dL (ref 0.0–1.2)
Bilirubin, Direct: 0.05 mg/dL (ref 0.00–0.40)
Total Protein: 6.5 g/dL (ref 6.0–8.5)

## 2014-12-11 LAB — STREP A DNA PROBE: Strep Gp A Direct, DNA Probe: NEGATIVE

## 2014-12-12 ENCOUNTER — Encounter: Payer: Self-pay | Admitting: Family Medicine

## 2014-12-13 ENCOUNTER — Ambulatory Visit (HOSPITAL_COMMUNITY): Admission: RE | Admit: 2014-12-13 | Payer: Medicaid Other | Source: Ambulatory Visit

## 2014-12-18 ENCOUNTER — Encounter: Payer: Self-pay | Admitting: Internal Medicine

## 2015-01-03 ENCOUNTER — Telehealth: Payer: Self-pay | Admitting: Family Medicine

## 2015-01-03 NOTE — Telephone Encounter (Signed)
Notified patient per Dr. Lorin PicketScott that she will needs to go to urgent care or ER to be evaluated first. Patient verbalized understanding.

## 2015-01-03 NOTE — Telephone Encounter (Signed)
Pt would like to know if she could get something called in for  What she thinks is the flu, symptoms achy, runny nose, headache Fever   Advised we don't do that, asked I send the note anyway's.    [facility]

## 2015-01-09 ENCOUNTER — Encounter: Payer: Self-pay | Admitting: Gastroenterology

## 2015-01-09 ENCOUNTER — Ambulatory Visit (INDEPENDENT_AMBULATORY_CARE_PROVIDER_SITE_OTHER): Payer: Medicaid Other | Admitting: Gastroenterology

## 2015-01-09 ENCOUNTER — Other Ambulatory Visit: Payer: Self-pay

## 2015-01-09 VITALS — BP 149/107 | HR 99 | Temp 98.0°F | Ht 63.0 in | Wt 187.2 lb

## 2015-01-09 DIAGNOSIS — R768 Other specified abnormal immunological findings in serum: Secondary | ICD-10-CM

## 2015-01-09 DIAGNOSIS — R109 Unspecified abdominal pain: Secondary | ICD-10-CM | POA: Diagnosis not present

## 2015-01-09 DIAGNOSIS — R894 Abnormal immunological findings in specimens from other organs, systems and tissues: Secondary | ICD-10-CM

## 2015-01-09 DIAGNOSIS — K5900 Constipation, unspecified: Secondary | ICD-10-CM | POA: Insufficient documentation

## 2015-01-09 DIAGNOSIS — K219 Gastro-esophageal reflux disease without esophagitis: Secondary | ICD-10-CM | POA: Diagnosis not present

## 2015-01-09 MED ORDER — PANTOPRAZOLE SODIUM 40 MG PO TBEC: 40.0000 mg | DELAYED_RELEASE_TABLET | Freq: Every day | ORAL | Status: DC

## 2015-01-09 MED ORDER — ONDANSETRON HCL 4 MG PO TABS: 4.0000 mg | ORAL_TABLET | Freq: Three times a day (TID) | ORAL | Status: DC | PRN

## 2015-01-09 MED ORDER — LINACLOTIDE 145 MCG PO CAPS: 145.0000 ug | ORAL_CAPSULE | Freq: Every day | ORAL | Status: DC

## 2015-01-09 NOTE — Patient Instructions (Signed)
We are scheduling you for a special ultrasound of your liver.   Stop Zantac. Start taking Protonix once each morning, 30 minutes before breakfast. Also, start taking Linzess 1 capsule each morning, 30 minutes before breakfast. This is for constipation. You may have loose stools for a few days but it will get better. Call us if not.   I have sent Zofran for nausea in again.   Please have blood work done today. We will see you in 4-6 weeks!

## 2015-01-09 NOTE — Assessment & Plan Note (Signed)
Start Protonix once daily. No alarm signs/symptoms.  

## 2015-01-09 NOTE — Assessment & Plan Note (Signed)
RNA negative a few months ago. Risk factors include tattoos. Likely cleared virus on own. Will recheck RNA now for our records. Recommend US abdomen as well now due to chronic right-sided abdominal pain. Likely multifactorial: see abdominal pain.

## 2015-01-09 NOTE — Assessment & Plan Note (Signed)
Chronic right-sided abdominal pain in setting of chronic constipation, intermittent GERD. Likely constipation playing a large role here. Start Linzess 145 mcg daily. Start Protonix once daily, stop Zantac. Zofran prescribed for prn nausea, although I suspect this will resolve with more aggressive bowel regimen and addition of PPI. Return in 4-6 weeks. If no improvement despite this measures, could consider EGD to assess for gastritis, PUD.

## 2015-01-09 NOTE — Assessment & Plan Note (Signed)
Start Linzess 145 mcg daily.  

## 2015-01-09 NOTE — Progress Notes (Signed)
Primary Care Physician:  Lubertha SouthSteve Luking, MD Primary Gastroenterologist:  Dr. Jena Gaussourk   Chief Complaint  Patient presents with  . Abdominal Pain  . Hepatitis C    HPI:   [name] is a 35 y.o. female presenting today at the request of her PCP secondary to abdominal pain and positive Hep C antibody.   States she was told she had issues with her spleen when she was 15. Hep C antibody positive but RNA negative on Oct 2016. At that time, AST/ALT were both 93. As of note, these labs were taken during an acute N/V illness; she states about a month ago had N/V but cousins had as well, now resolved. Most recent LFTs completely normal.   Pain in RUQ, chronic. Years.  Doesn't have a BM regularly. May go once every 2-3 weeks. Sometimes will just cry and cry because of pain. Takes tylenol prn. Will occasionally have indigestion but not a lot. No dysphagia. No N/V other than illness recently, now resolved. No unexplained weight loss or lack of appetite. Taking Zantac 300 mg prn. No rectal bleeding. No IV drug use. Has tattoos. No blood transfusions.  Past Medical History  Diagnosis Date  . Chronic back pain     herniated disc  . Chronic pelvic pain in female   . History of bronchitis     yrs ago  . Seasonal allergies     Claritin and FLonase daily as needed  . Headache     constant  . History of kidney stones   . Nocturia   . Depression     takes Lexapro daily  . Anxiety     takes Xanax daily as needed  . Insomnia     takes Elavil nightly    Past Surgical History  Procedure Laterality Date  . Cholecystectomy    . Tubal ligation    . Thoracic discectomy Right 03/28/2014    Procedure: RIGHT THORACIC ELEVEN-TWELVE MICRODICSECTOMY;  Surgeon: Tressie StalkerJeffrey Jenkins, MD;  Location: [facility];  Service: Neurosurgery;  Laterality: Right;  right    Current Outpatient Prescriptions  Medication Sig Dispense Refill  . ALPRAZolam (XANAX) 1 MG tablet Take 1 tablet (1 mg total) by mouth 3  (three) times daily as needed for anxiety. 90 tablet 5  . amitriptyline (ELAVIL) 10 MG tablet Take 2 tablets (20 mg total) by mouth at bedtime as needed for sleep. 60 tablet 2  . escitalopram (LEXAPRO) 10 MG tablet Take 1 tablet (10 mg total) by mouth daily. 30 tablet 5  . ondansetron (ZOFRAN-ODT) 8 MG disintegrating tablet Take 1 tablet (8 mg total) by mouth every 8 (eight) hours as needed for nausea or vomiting. 30 tablet 0  . ranitidine (ZANTAC) 300 MG tablet One po qd prn acid reflux 30 tablet 5  . Linaclotide (LINZESS) 145 MCG CAPS capsule Take 1 capsule (145 mcg total) by mouth daily. Take 30 minutes before breakfast 30 capsule 5  . ondansetron (ZOFRAN) 4 MG tablet Take 1 tablet (4 mg total) by mouth every 8 (eight) hours as needed for nausea or vomiting. 30 tablet 1  . pantoprazole (PROTONIX) 40 MG tablet Take 1 tablet (40 mg total) by mouth daily. 90 tablet 3   No current facility-administered medications for this visit.    Allergies as of 01/09/2015 - Review Complete 01/09/2015  Allergen Reaction Noted  . Motrin [ibuprofen]  09/16/2012  . Hydrocodone Hives 07/12/2014  . Penicillins Nausea And Vomiting 08/14/2013  . Ultram [  tramadol] Nausea And Vomiting 03/24/2012  . Aspirin Nausea And Vomiting and Rash 01/19/2011    Family History  Problem Relation Age of Onset  . Hypertension Mother   . Hypertension Father   . Hypertension Sister   . Stroke Other   . Cancer Other   . Colon cancer Neg Hx     Social History   Social History  . Marital Status: Married    Spouse Name: N/A  . Number of Children: 3  . Years of Education: N/A   Occupational History  . Not on file.   Social History Main Topics  . Smoking status: Current Every Day Smoker -- 0.50 packs/day for 5 years    Types: Cigarettes  . Smokeless tobacco: Never Used  . Alcohol Use: No  . Drug Use: No  . Sexual Activity: Yes    Birth Control/ Protection: Surgical   Other Topics Concern  . Not on file   Social  History Narrative    Review of Systems: As noted in HPI.   Physical Exam: BP 149/107 mmHg  Pulse 99  Temp(Src) 98 F (36.7 C)  Ht 5\' 3"  (1.6 m)  Wt 187 lb 3.2 oz (84.913 kg)  BMI 33.17 kg/m2  LMP 01/08/2015 General:   Alert and oriented. Pleasant and cooperative. Well-nourished and well-developed. Appears slightly older than stated age.  Head:  Normocephalic and atraumatic. Eyes:  Without icterus, sclera clear and conjunctiva pink.  Ears:  Normal auditory acuity. Nose:  No deformity, discharge,  or lesions. Mouth:  No deformity or lesions, oral mucosa pink.  Lungs:  Clear to auscultation bilaterally. No wheezes, rales, or rhonchi. No distress.  Heart:  S1, S2 present without murmurs appreciated.  Abdomen:  +BS, soft, mild TTP RUQ/right-sided abdomen and non-distended. No HSM noted. No guarding or rebound. No masses appreciated.  Rectal:  Deferred  Msk:  Symmetrical without gross deformities. Normal posture. Extremities:  Without edema. Neurologic:  Alert and  oriented x4;  grossly normal neurologically. Psych:  Alert and cooperative. Normal mood and affect.  Lab Results  Component Value Date   WBC 8.9 04/03/2014   HGB 13.5 04/03/2014   HCT 40.4 04/03/2014   MCV 95.5 04/03/2014   PLT 319 04/03/2014   Lab Results  Component Value Date   ALT 12 12/10/2014   AST 13 12/10/2014   ALKPHOS 65 12/10/2014   BILITOT <0.2 12/10/2014

## 2015-01-10 NOTE — Progress Notes (Signed)
CC'ED TO PCP 

## 2015-01-14 LAB — HEPATITIS C RNA QUANTITATIVE: HCV QUANT: NOT DETECTED [IU]/mL (ref <15)

## 2015-01-17 ENCOUNTER — Ambulatory Visit (HOSPITAL_COMMUNITY): Admission: RE | Admit: 2015-01-17 | Payer: Medicaid Other | Source: Ambulatory Visit

## 2015-01-20 NOTE — Progress Notes (Signed)
Quick Note:  Negative Hep C viral load as expected. US with elastography pending. ______

## 2015-01-22 ENCOUNTER — Ambulatory Visit (HOSPITAL_COMMUNITY)
Admission: RE | Admit: 2015-01-22 | Discharge: 2015-01-22 | Disposition: A | Discharge status: Home / Self Care | Payer: Medicaid Other | Source: Ambulatory Visit | Attending: Gastroenterology | Admitting: Gastroenterology

## 2015-01-22 DIAGNOSIS — R894 Abnormal immunological findings in specimens from other organs, systems and tissues: Secondary | ICD-10-CM | POA: Diagnosis present

## 2015-01-22 DIAGNOSIS — Z9049 Acquired absence of other specified parts of digestive tract: Secondary | ICD-10-CM | POA: Diagnosis not present

## 2015-01-22 DIAGNOSIS — R768 Other specified abnormal immunological findings in serum: Secondary | ICD-10-CM

## 2015-01-23 ENCOUNTER — Other Ambulatory Visit (HOSPITAL_COMMUNITY): Payer: Medicaid Other

## 2015-01-23 ENCOUNTER — Other Ambulatory Visit: Payer: Self-pay | Admitting: Gastroenterology

## 2015-01-27 NOTE — Progress Notes (Signed)
Quick Note:  Normal appearance of liver but fibrosis score of F3 and F4. We need to follow this closely. I will be seeing her in a few weeks. If persistent upper GI symptoms, could pursue EGD. Need to follow LFTs closely.     ______

## 2015-01-28 MED FILL — SUBOXONE 8 MG-2 MG SL FILM: 8-2 | 30 days supply | Qty: 75 | Fill #0

## 2015-02-24 ENCOUNTER — Ambulatory Visit: Payer: Medicaid Other | Admitting: Gastroenterology

## 2015-03-07 ENCOUNTER — Encounter: Payer: Self-pay | Admitting: Gastroenterology

## 2015-03-07 ENCOUNTER — Telehealth: Payer: Self-pay | Admitting: Gastroenterology

## 2015-03-07 ENCOUNTER — Ambulatory Visit: Payer: Medicaid Other | Admitting: Gastroenterology

## 2015-03-07 NOTE — Telephone Encounter (Signed)
PATIENT WAS A NO SHOW AND LETTER SENT  °

## 2015-03-11 ENCOUNTER — Encounter: Payer: Self-pay | Admitting: Nurse Practitioner

## 2015-03-11 ENCOUNTER — Ambulatory Visit (INDEPENDENT_AMBULATORY_CARE_PROVIDER_SITE_OTHER): Payer: Medicaid Other | Admitting: Nurse Practitioner

## 2015-03-11 VITALS — BP 110/74 | Ht 63.0 in | Wt 178.0 lb

## 2015-03-11 DIAGNOSIS — G4700 Insomnia, unspecified: Secondary | ICD-10-CM

## 2015-03-11 DIAGNOSIS — F32A Depression, unspecified: Secondary | ICD-10-CM

## 2015-03-11 DIAGNOSIS — F329 Major depressive disorder, single episode, unspecified: Secondary | ICD-10-CM

## 2015-03-11 DIAGNOSIS — F418 Other specified anxiety disorders: Secondary | ICD-10-CM | POA: Diagnosis not present

## 2015-03-11 DIAGNOSIS — F419 Anxiety disorder, unspecified: Principal | ICD-10-CM

## 2015-03-11 MED ORDER — ALPRAZOLAM 1 MG PO TABS: ORAL_TABLET | ORAL | Status: DC

## 2015-03-11 NOTE — Progress Notes (Signed)
Subjective:   Presents for recheck. Has been going to counseling with her daughter which is greatly helping. While her daughter is doing much better, she is now having issues with her 36 year old son. Remains under tremendous stress. Has increased her Xanax to 4 times per day which is working well. Has stopped the amitriptyline but is taking her Lexapro daily.  Objective:   BP 110/74 mmHg  Ht 5\' 3"  (1.6 m)  Wt 178 lb (80.74 kg)  BMI 31.54 kg/m2  NAD. Alert, oriented. Thoughts logical coherent and relevant. Calm affect. Lungs clear. Heart regular rate rhythm.  Assessment:  Problem List Items Addressed This Visit      Other   Anxiety and depression - Primary   Insomnia     Plan:  Meds ordered this encounter  Medications  .                             Marland Kitchen. ALPRAZolam (XANAX) 1 MG tablet    Sig: One po QID prn anxiety; max 4 per 24 hours    Dispense:  120 tablet    Refill:  5    May refill monthly; please cancel other Xanax prescriptions    Order Specific Question:  Supervising Provider    Answer:  Merlyn AlbertLUKING, WILLIAM S [[id number]]   a review of the state registry for controlled substances reveals patient had her Xanax last field on 2/25. A note was written on this prescription that she may feel this after 3/16. Reminded patient that she should not take Xanax with any  Pain medicine or narcotics due to respiratory risk. Patient verbalizes understanding. She is to limit her Xanax to no more than 4 times per day , understands this is the maximum dose she will be receiving. Return in about 6 months (around 09/11/2015) for recheck.

## 2015-03-12 ENCOUNTER — Ambulatory Visit: Payer: Medicaid Other | Admitting: Nurse Practitioner

## 2015-03-17 ENCOUNTER — Telehealth: Payer: Self-pay | Admitting: Family Medicine

## 2015-03-17 NOTE — Telephone Encounter (Signed)
Pt states she is going out of town for a funeral and will be gone for two weeks  In [place], she is two days early on the meds and will need approval  [facility]

## 2015-03-17 NOTE — Telephone Encounter (Signed)
Just please verify date of last refill. If this fits her request, please approve for one time only. Thanks.

## 2015-03-17 NOTE — Telephone Encounter (Signed)
Drug registry shows patient got her last Xanax prescription for 90 tablets on 03/01/2015

## 2015-03-17 NOTE — Telephone Encounter (Signed)
[facility] states she just filled 30 day supply on feb 25th and they are not going to fill this med at their pharm because she has been using other pharms as well.

## 2015-03-18 NOTE — Telephone Encounter (Signed)
I reviewed [name] controlled substances registry when patient was in office. Her Rx was written but could not be filled for several weeks because of her last refill. However, with the dose of Suboxone that she is on, I would like to keep her current dose at 90 pills (recently wrote for 120) due to risk of excessive drowsiness and possible respiratory problems when taken together. So, I will print a new Rx tomorrow for 90 with the date of refill. Thanks for your patience! We can discuss with her tomorrow once I get this straight.

## 2015-03-19 ENCOUNTER — Other Ambulatory Visit: Payer: Self-pay | Admitting: Nurse Practitioner

## 2015-03-19 MED ORDER — ALPRAZOLAM 1 MG PO TABS: ORAL_TABLET | ORAL | Status: DC

## 2015-03-19 NOTE — Telephone Encounter (Signed)
Rx faxed to [facility]. Patient notified.

## 2015-03-19 NOTE — Telephone Encounter (Signed)
Please document that patient is no longer on Suboxone. I will prescribe Xanax as we agreed at last visit with several provisions:  #1: she must identify the pharmacy she plans to use for Xanax for our records and uses ONLY that pharmacy; any changes and we need to be notified. #2: she must not take Xanax if she is on Suboxone or any pain medication due to the risk for respiratory issues #3: each prescription must last 30 days #4: this is our policy; any violation will result in no further controlled substances being prescribed  Once we have identified the pharmacy, we will send in new Rx; all other Xanax prescriptions are void   Thanks.

## 2015-03-19 NOTE — Telephone Encounter (Signed)
Patient states she is not on suboxone because it made her very sick and wants to use [facility] for her xanax and agrees to notify us immediately if she starts the suboxone or pain med

## 2015-04-07 ENCOUNTER — Encounter (HOSPITAL_COMMUNITY): Payer: Self-pay | Admitting: Emergency Medicine

## 2015-04-07 ENCOUNTER — Emergency Department (HOSPITAL_COMMUNITY): Payer: Medicaid Other

## 2015-04-07 ENCOUNTER — Emergency Department (HOSPITAL_COMMUNITY)
Admission: EM | Admit: 2015-04-07 | Discharge: 2015-04-07 | Disposition: A | Discharge status: Home / Self Care | Payer: Medicaid Other | Attending: Emergency Medicine | Admitting: Emergency Medicine

## 2015-04-07 DIAGNOSIS — R110 Nausea: Secondary | ICD-10-CM | POA: Diagnosis not present

## 2015-04-07 DIAGNOSIS — F410 Panic disorder [episodic paroxysmal anxiety] without agoraphobia: Secondary | ICD-10-CM | POA: Insufficient documentation

## 2015-04-07 DIAGNOSIS — R0789 Other chest pain: Secondary | ICD-10-CM | POA: Diagnosis not present

## 2015-04-07 DIAGNOSIS — Z3202 Encounter for pregnancy test, result negative: Secondary | ICD-10-CM | POA: Diagnosis not present

## 2015-04-07 DIAGNOSIS — Z8709 Personal history of other diseases of the respiratory system: Secondary | ICD-10-CM | POA: Insufficient documentation

## 2015-04-07 DIAGNOSIS — Z880 Allergy status to penicillin: Secondary | ICD-10-CM | POA: Insufficient documentation

## 2015-04-07 DIAGNOSIS — F17210 Nicotine dependence, cigarettes, uncomplicated: Secondary | ICD-10-CM | POA: Diagnosis not present

## 2015-04-07 DIAGNOSIS — G8929 Other chronic pain: Secondary | ICD-10-CM | POA: Insufficient documentation

## 2015-04-07 DIAGNOSIS — F419 Anxiety disorder, unspecified: Secondary | ICD-10-CM | POA: Insufficient documentation

## 2015-04-07 DIAGNOSIS — R079 Chest pain, unspecified: Secondary | ICD-10-CM | POA: Diagnosis present

## 2015-04-07 DIAGNOSIS — F329 Major depressive disorder, single episode, unspecified: Secondary | ICD-10-CM | POA: Diagnosis not present

## 2015-04-07 DIAGNOSIS — Z79899 Other long term (current) drug therapy: Secondary | ICD-10-CM | POA: Diagnosis not present

## 2015-04-07 LAB — COMPREHENSIVE METABOLIC PANEL
ALK PHOS: 53 U/L (ref 38–126)
ALT: 12 U/L — AB (ref 14–54)
AST: 12 U/L — ABNORMAL LOW (ref 15–41)
Albumin: 3.7 g/dL (ref 3.5–5.0)
Anion gap: 8 (ref 5–15)
BUN: 9 mg/dL (ref 6–20)
CALCIUM: 8.8 mg/dL — AB (ref 8.9–10.3)
CO2: 25 mmol/L (ref 22–32)
CREATININE: 0.89 mg/dL (ref 0.44–1.00)
Chloride: 110 mmol/L (ref 101–111)
Glucose, Bld: 115 mg/dL — ABNORMAL HIGH (ref 65–99)
Potassium: 2.9 mmol/L — ABNORMAL LOW (ref 3.5–5.1)
SODIUM: 143 mmol/L (ref 135–145)
TOTAL PROTEIN: 6.1 g/dL — AB (ref 6.5–8.1)
Total Bilirubin: 0.3 mg/dL (ref 0.3–1.2)

## 2015-04-07 LAB — CBC WITH DIFFERENTIAL/PLATELET
Basophils Absolute: 0 10*3/uL (ref 0.0–0.1)
Basophils Relative: 0 %
Eosinophils Absolute: 0.1 10*3/uL (ref 0.0–0.7)
Eosinophils Relative: 2 %
HEMATOCRIT: 40.5 % (ref 36.0–46.0)
HEMOGLOBIN: 13.5 g/dL (ref 12.0–15.0)
LYMPHS ABS: 2.2 10*3/uL (ref 0.7–4.0)
LYMPHS PCT: 28 %
MCH: 31.2 pg (ref 26.0–34.0)
MCHC: 33.3 g/dL (ref 30.0–36.0)
MCV: 93.5 fL (ref 78.0–100.0)
MONOS PCT: 10 %
Monocytes Absolute: 0.8 10*3/uL (ref 0.1–1.0)
NEUTROS ABS: 4.7 10*3/uL (ref 1.7–7.7)
NEUTROS PCT: 60 %
Platelets: 235 10*3/uL (ref 150–400)
RBC: 4.33 MIL/uL (ref 3.87–5.11)
RDW: 14.4 % (ref 11.5–15.5)
WBC: 7.9 10*3/uL (ref 4.0–10.5)

## 2015-04-07 LAB — TROPONIN I

## 2015-04-07 LAB — LIPASE, BLOOD: Lipase: 39 U/L (ref 11–51)

## 2015-04-07 LAB — I-STAT BETA HCG BLOOD, ED (MC, WL, AP ONLY): I-stat hCG, quantitative: <5 m[IU]/mL (ref <5)

## 2015-04-07 MED ORDER — LORAZEPAM 2 MG/ML IJ SOLN
1.0000 mg | Freq: Once | INTRAMUSCULAR | Status: AC
  Administered 2015-04-07: 1 mg via INTRAVENOUS
  Filled 2015-04-07: qty 1

## 2015-04-07 MED ORDER — SODIUM CHLORIDE 0.9 % IV BOLUS (SEPSIS)
500.0000 mL | Freq: Once | INTRAVENOUS | Status: AC
  Administered 2015-04-07: 500 mL via INTRAVENOUS

## 2015-04-07 NOTE — ED Notes (Signed)
Pt. arrived with EMS from home reports central chest pain onset this evening with left arm numbness and nausea , denies emesis or diaphoresis , received 1 NTG sl by EMS . Pain increases with deep inspiration .

## 2015-04-07 NOTE — ED Notes (Signed)
EDP ( Dr. Blinda LeatherwoodPollina ) notified on pt.'s hypokalemia .

## 2015-04-07 NOTE — ED Provider Notes (Signed)
CSN: [id number]     Arrival date & time 04/07/15  0046 History  By signing my name below, I, Linus GalasMaharshi Patel, attest that this documentation has been prepared under the direction and in the presence of Gilda Creasehristopher J Indigo Barbian, MD. Electronically Signed: Linus GalasMaharshi Patel, ED Scribe. 04/07/2015. 12:51 AM.   Chief Complaint  Patient presents with  . Chest Pain   The history is provided by the patient. No language interpreter was used.    HPI Comments: [name] is a 35 y.o. female  Brought by EMS to the Emergency Department with a PMHx of anxiety and panic attacks complaining of constant, sudden substernal chest pain that began 2-3 hours ago. Pt also reports left arm numbness and nausea. No aggravating or alleviating factors. She states her pain is similar to her previous panic attack. Pt denies emesis or any other symptoms at this time.    Pt states that her father ans sister have "heart problems" but she does not know what they are exactly. Past Medical History  Diagnosis Date  . Chronic back pain     herniated disc  . Chronic pelvic pain in female   . History of bronchitis     yrs ago  . Seasonal allergies     Claritin and FLonase daily as needed  . Headache     constant  . History of kidney stones   . Nocturia   . Depression     takes Lexapro daily  . Anxiety     takes Xanax daily as needed  . Insomnia     takes Elavil nightly   Past Surgical History  Procedure Laterality Date  . Cholecystectomy    . Tubal ligation    . Thoracic discectomy Right 03/28/2014    Procedure: RIGHT THORACIC ELEVEN-TWELVE MICRODICSECTOMY;  Surgeon: Tressie StalkerJeffrey Jenkins, MD;  Location: [facility];  Service: Neurosurgery;  Laterality: Right;  right   Family History  Problem Relation Age of Onset  . Hypertension Mother   . Hypertension Father   . Hypertension Sister   . Stroke Other   . Cancer Other   . Colon cancer Neg Hx    Social History  Substance Use Topics  . Smoking status: Current Every  Day Smoker -- 0.00 packs/day for 0 years    Types: Cigarettes  . Smokeless tobacco: Never Used  . Alcohol Use: No   OB History    Gravida Para Term Preterm AB TAB SAB Ectopic Multiple Living   3 3 3             Review of Systems  Cardiovascular: Positive for chest pain.  Gastrointestinal: Positive for nausea. Negative for vomiting.  Neurological: Positive for numbness.  All other systems reviewed and are negative.     Allergies  Motrin; Hydrocodone; Penicillins; Ultram; and Aspirin  Home Medications   Prior to Admission medications   Medication Sig Start Date End Date Taking? Authorizing Provider  ALPRAZolam Prudy Feeler(XANAX) 1 MG tablet One po QID prn anxiety; max 4 per 24 hours 03/19/15  Yes Campbell Richesarolyn C Hoskins, NP  escitalopram (LEXAPRO) 10 MG tablet Take 1 tablet (10 mg total) by mouth daily. 09/12/14  Yes Campbell Richesarolyn C Hoskins, NP  ondansetron (ZOFRAN) 4 MG tablet TAKE ONE TABLET BY MOUTH EVERY 8 HOURS AS NEEDED FOR NAUSEA AND VOMITING 01/24/15  Yes Nira RetortAnna W Sams, NP   BP 102/79 mmHg  Pulse 83  Resp 18  SpO2 98%  LMP 03/24/2015 (Approximate)   Physical Exam  Constitutional: She  is oriented to person, place, and time. She appears well-developed and well-nourished. No distress.  HENT:  Head: Normocephalic and atraumatic.  Right Ear: Hearing normal.  Left Ear: Hearing normal.  Nose: Nose normal.  Mouth/Throat: Oropharynx is clear and moist and mucous membranes are normal.  Eyes: Conjunctivae and EOM are normal. Pupils are equal, round, and reactive to light.  Neck: Normal range of motion. Neck supple.  Cardiovascular: Regular rhythm, S1 normal and S2 normal.  Exam reveals no gallop and no friction rub.   No murmur heard. Pulmonary/Chest: Effort normal and breath sounds normal. No respiratory distress. She exhibits tenderness (diffuse).  Abdominal: Soft. Normal appearance and bowel sounds are normal. There is no hepatosplenomegaly. There is no tenderness. There is no rebound, no guarding,  no tenderness at McBurney's point and negative Murphy's sign. No hernia.  Musculoskeletal: Normal range of motion.  Neurological: She is alert and oriented to person, place, and time. She has normal strength. No cranial nerve deficit or sensory deficit. Coordination normal. GCS eye subscore is 4. GCS verbal subscore is 5. GCS motor subscore is 6.  Skin: Skin is warm, dry and intact. No rash noted. No cyanosis.  Psychiatric: She has a normal mood and affect. Her speech is normal and behavior is normal. Thought content normal.  Nursing note and vitals reviewed.   ED Course  Procedures   DIAGNOSTIC STUDIES: Oxygen Saturation is 100% on room air, normal by my interpretation.    COORDINATION OF CARE: 12:50 AM Will give fluids and Ativan. Will order CXR, urinalysis, and blood work. Discussed treatment plan with pt at bedside and pt agreed to plan.  Labs Review Labs Reviewed  COMPREHENSIVE METABOLIC PANEL - Abnormal; Notable for the following:    Potassium 2.9 (*)    Glucose, Bld 115 (*)    Calcium 8.8 (*)    Total Protein 6.1 (*)    AST 12 (*)    ALT 12 (*)    All other components within normal limits  CBC WITH DIFFERENTIAL/PLATELET  LIPASE, BLOOD  TROPONIN I  URINALYSIS, ROUTINE W REFLEX MICROSCOPIC (NOT AT [facility])  I-STAT BETA HCG BLOOD, ED ([facility], [facility], [facility] ONLY)    Imaging Review Dg Chest 2 View  04/07/2015  CLINICAL DATA:  Acute onset of mid to left upper chest pain. Initial encounter. EXAM: CHEST  2 VIEW COMPARISON:  Chest radiograph from 04/03/2014 FINDINGS: The heart size and mediastinal contours are within normal limits. Both lungs are clear. The visualized skeletal structures are unremarkable. IMPRESSION: No acute cardiopulmonary process seen. Electronically Signed   By: Roanna Raider M.D.   On: 04/07/2015 01:27   I have personally reviewed and evaluated these images and lab results as part of my medical decision-making.   EKG Interpretation   Date/Time:  Monday April 07 2015  00:55:15 EDT Ventricular Rate:  89 PR Interval:  127 QRS Duration: 94 QT Interval:  384 QTC Calculation: 467 R Axis:   54 Text Interpretation:  Sinus rhythm Normal ECG Confirmed by Nehal Shives  MD,  Malie Kashani ([id number]) on 04/07/2015 12:58:24 AM      MDM   Final diagnoses:  Atypical chest pain  Panic attack    Patient presents to the ER for evaluation of chest pain. Patient complaining of central chest pain that started this evening. She has numbness in the arms and is feeling nausea. She has not had nausea, vomiting or diaphoresis. Patient received nitroglycerin by EMS but did not have any improvement. She reports pain with deep  inspiration, but is also experiencing pain with palpation and movements of the chest. She has had previous episodes similar to this with panic attack. Pain is atypical and HEART Score is low risk:  HEART Score for Major Cardiac Events from [employer]  on 04/07/2015 ** All calculations should be rechecked by clinician prior to use **  RESULT SUMMARY: 1 points Low Score (0-3 points)  Risk of MACE of 0.9-1.7%.   INPUTS: History -> 0 = Slightly suspicious EKG -> 0 = Normal Age -> 0 = < 45 Risk factors -> 1 = 1-2 risk factors Troponin -> 0 = ? normal limit  Patient administered IV Ativan and is feeling better. Patient is very low risk for cardiac etiology. PERC is negative  PERC Rule for Pulmonary Embolism from [employer]  on 04/07/2015 ** All calculations should be rechecked by clinician prior to use **  RESULT SUMMARY: 0 criteria No need for further workup, as <2% chance of PE.  If no criteria are positive and clinician's pre-test probability is <15%, PERC Rule criteria are satisfied.   INPUTS: Age ? 48 -> 0 = No HR ? 100 -> 0 = No O2 Sat on room air < 95% -> 0 = No Prior history of DVT/PE -> 0 = No Recent trauma or surgery -> 0 = No Hemoptysis -> 0 = No Exogenous estrogen -> 0 = No Unilateral leg swelling -> 0 = No  It is therefore felt that  the patient is appropriate for discharge and follow-up with primary care physician.  I personally performed the services described in this documentation, which was scribed in my presence. The recorded information has been reviewed and is accurate.    Gilda Crease, MD 04/07/15 [phone]

## 2015-04-07 NOTE — Discharge Instructions (Signed)

## 2015-04-17 ENCOUNTER — Telehealth: Payer: Self-pay | Admitting: Family Medicine

## 2015-04-17 NOTE — Telephone Encounter (Signed)
xanax

## 2015-04-17 NOTE — Telephone Encounter (Signed)
Pt is ready for her refill and wants to pick it up tomorrow  So she can try to fill it since her pharmacy and our office Are closed saturday?     [facility]

## 2015-04-18 ENCOUNTER — Other Ambulatory Visit: Payer: Self-pay | Admitting: Nurse Practitioner

## 2015-04-18 MED ORDER — ALPRAZOLAM 1 MG PO TABS: ORAL_TABLET | ORAL | Status: DC

## 2015-04-18 NOTE — Progress Notes (Signed)
Reviewed the controlled substance registry. Patient is no longer on Suboxone since January. No other meds listed except Xanax prescribed by our office.

## 2015-04-18 NOTE — Telephone Encounter (Signed)
printed

## 2015-07-15 ENCOUNTER — Telehealth: Payer: Self-pay | Admitting: Family Medicine

## 2015-07-15 NOTE — Telephone Encounter (Signed)
[phone] [facility]   escitalopram (LEXAPRO) 10 MG tablet ALPRAZolam (XANAX) 1 MG tablet  Refills please, states she would like a call when you send this in

## 2015-07-15 NOTE — Telephone Encounter (Signed)
Last seen 03/11/15

## 2015-07-16 ENCOUNTER — Other Ambulatory Visit: Payer: Self-pay | Admitting: *Deleted

## 2015-07-16 ENCOUNTER — Other Ambulatory Visit: Payer: Self-pay | Admitting: Nurse Practitioner

## 2015-07-16 MED ORDER — ALPRAZOLAM 1 MG PO TABS: ORAL_TABLET | ORAL | Status: DC

## 2015-07-16 MED ORDER — ESCITALOPRAM OXALATE 10 MG PO TABS: 10.0000 mg | ORAL_TABLET | Freq: Every day | ORAL | Status: DC

## 2015-07-16 NOTE — Telephone Encounter (Signed)
Consult with carolyn. Call pharm. Last filled June 12th for 120. Pt is due today. Called in one month supply so pt could get today before going out of town.  Pt notified. Pt notified enough refills will be sent later after carolyn reviews note and message. Pt's next office visit is sept 7th.

## 2015-07-16 NOTE — Telephone Encounter (Signed)
Discussed with pt. Pt states she will be out of xanax today. Last rx wrote in April with 2 additional refills. Pt states she is going out of town today and would like this called in within the next hour if possible.

## 2015-07-16 NOTE — Telephone Encounter (Signed)
Left message to return call 

## 2015-07-16 NOTE — Telephone Encounter (Signed)
I will refill Lexapro but she has 2 more refills on Xanax. Will refill that at her 6 month visit.

## 2015-08-14 ENCOUNTER — Other Ambulatory Visit: Payer: Self-pay | Admitting: Nurse Practitioner

## 2015-08-14 ENCOUNTER — Telehealth: Payer: Self-pay | Admitting: *Deleted

## 2015-08-14 MED ORDER — ALPRAZOLAM 1 MG PO TABS: ORAL_TABLET | ORAL | 0 refills | Status: DC

## 2015-08-14 NOTE — Telephone Encounter (Signed)
Rx printed. Please call patient and leave up front for pick up.

## 2015-08-14 NOTE — Telephone Encounter (Signed)
Script faxed. Pt notified.

## 2015-08-14 NOTE — Telephone Encounter (Signed)
Pt needs refill on xanax. Has follow up office visit on sept 7th. Pt wants to pick up today because she is going out of town tomorrow. Please call pt when it has been sent. [phone]

## 2015-08-15 ENCOUNTER — Telehealth: Payer: Self-pay | Admitting: Family Medicine
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
## 2015-08-19 ENCOUNTER — Telehealth: Payer: Self-pay | Admitting: Family Medicine

## 2015-08-19 MED ORDER — ALPRAZOLAM 1 MG PO TABS: ORAL_TABLET | ORAL | 0 refills | Status: DC

## 2015-08-19 NOTE — Telephone Encounter (Addendum)
Discussed with Dr Brett CanalesSteve Patient may have a one week worth of med and needs office visit with Eber Jonesarolyn with in that time
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
## 2015-08-19 NOTE — Telephone Encounter (Signed)
Await md sign 

## 2015-08-19 NOTE — Telephone Encounter (Signed)
Patient notified and scheduled follow up office visit. 

## 2015-08-20 ENCOUNTER — Encounter: Payer: Self-pay | Admitting: Nurse Practitioner

## 2015-08-20 ENCOUNTER — Ambulatory Visit (INDEPENDENT_AMBULATORY_CARE_PROVIDER_SITE_OTHER): Payer: Medicaid Other | Admitting: Nurse Practitioner

## 2015-08-20 ENCOUNTER — Encounter: Payer: Self-pay | Admitting: Family Medicine

## 2015-08-20 VITALS — BP 118/70 | Ht 63.0 in | Wt 178.0 lb

## 2015-08-20 DIAGNOSIS — F419 Anxiety disorder, unspecified: Principal | ICD-10-CM

## 2015-08-20 DIAGNOSIS — F418 Other specified anxiety disorders: Secondary | ICD-10-CM

## 2015-08-20 DIAGNOSIS — F329 Major depressive disorder, single episode, unspecified: Secondary | ICD-10-CM

## 2015-08-20 MED ORDER — ALPRAZOLAM 1 MG PO TABS: ORAL_TABLET | ORAL | 0 refills | Status: DC

## 2015-08-20 MED ORDER — DULOXETINE HCL 30 MG PO CPEP: 30.0000 mg | ORAL_CAPSULE | Freq: Every day | ORAL | 2 refills | Status: DC
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
## 2015-09-11 ENCOUNTER — Ambulatory Visit: Payer: Medicaid Other | Admitting: Nurse Practitioner

## 2015-10-07 ENCOUNTER — Telehealth: Payer: Self-pay | Admitting: Family Medicine

## 2015-10-07 MED ORDER — ALPRAZOLAM 1 MG PO TABS: ORAL_TABLET | ORAL | 0 refills | Status: DC

## 2015-10-07 NOTE — Telephone Encounter (Signed)
Left message to return call 

## 2015-10-07 NOTE — Telephone Encounter (Signed)
Reviewed patient's chart and patient was referred to Mental health and a letter was mailed out for patient to schedule an appointment. Spoke with patient and patient stated she did not receive a letter and would like it to be resent and she wanted to know if we could refill medication until she got in with Dr.Ross. Please advise?

## 2015-10-07 NOTE — Telephone Encounter (Signed)
Spoke with patient and informed her per Dr.Steve Luking- You may have 30 days worth of Xanax, will need to get to Mental Health as soon as possible to assume prescription with them. Patient verbalized understanding and was given the number to behavioral health to schedule appointment [facility]. Patient stated she will call them to schedule appointment.

## 2015-10-07 NOTE — Telephone Encounter (Signed)
,  NTSW I thought pt was referred to mental heal;th for these meds

## 2015-10-07 NOTE — Telephone Encounter (Signed)
Patient feels she needs to increase the dosage on her Cymbalta.  Also, refill on her alprazolam.  [facility]

## 2015-10-07 NOTE — Telephone Encounter (Signed)
Seen 08/20/15 for anxiey and depression. Switched at that time from lexapro to cymbalta 30mg . Pt states doing better but feels like cymbalta needs to be increased and needs refill on xanax. Pt wanted to get today instead of waiting for carolyn to return tomorrow. She states carolyn told her to call if she needed it increased.

## 2015-10-07 NOTE — Telephone Encounter (Signed)
30 days worth, no refill, pt needs to get to m h asap to assume her rx with them

## 2015-11-17 ENCOUNTER — Telehealth: Payer: Self-pay | Admitting: Family Medicine

## 2015-11-17 MED ORDER — ALPRAZOLAM 1 MG PO TABS: ORAL_TABLET | ORAL | 0 refills | Status: DC

## 2015-11-17 NOTE — Telephone Encounter (Signed)
Spoke with patient and verified that we did give her the number and she did receive the letter that was mailed to her in regards to her referral from 10/07/15. Patient stated that she did in fact call them but she was told that they can not see her for two weeks out and she is out of her medication and she is getting sick. Please advise?

## 2015-11-17 NOTE — Telephone Encounter (Signed)
21 days worth of med

## 2015-11-17 NOTE — Telephone Encounter (Signed)
See oct 3 message, I think we have already done our part, ask brendale to make sure

## 2015-11-17 NOTE — Telephone Encounter (Signed)
Left message return call 11/17/15 (prescription printed)

## 2015-11-17 NOTE — Telephone Encounter (Signed)
Left message return call 11/17/15 

## 2015-11-17 NOTE — Telephone Encounter (Signed)
Pt notified script faxed

## 2015-11-17 NOTE — Telephone Encounter (Signed)
Patient is requesting a referral to Dr, Deborah RoDiannia Ruderss so she can get her medication. States completely out.[phone]

## 2015-12-08 ENCOUNTER — Other Ambulatory Visit: Payer: Self-pay | Admitting: Family Medicine

## 2015-12-08 ENCOUNTER — Other Ambulatory Visit: Payer: Self-pay | Admitting: Nurse Practitioner

## 2015-12-08 MED ORDER — ALPRAZOLAM 1 MG PO TABS: ORAL_TABLET | ORAL | 0 refills | Status: DC

## 2015-12-08 NOTE — Telephone Encounter (Signed)
Reviewed Steve's notes; when and where is her mental health appointment? Thanks.

## 2015-12-08 NOTE — Telephone Encounter (Signed)
Patient states that we needed to resend the referral. She still does not have an appointment. She stated that they need the referral sent today and they would get her scheduled in January. Enid DerryBrendale resent the referral today and she is waiting for the office to contact her.

## 2015-12-08 NOTE — Telephone Encounter (Signed)
Left message return call 12/08/15

## 2015-12-08 NOTE — Telephone Encounter (Signed)
Patient requesting refills on cymbata 30 mg and xanax 1 mg states she is seeing specialist in January please refer back to last message for Dr. Brett CanalesSteve

## 2015-12-08 NOTE — Telephone Encounter (Signed)
Left message return call. Number in chart is not patient's number it is the patient step mother.

## 2015-12-08 NOTE — Telephone Encounter (Signed)
Since Brett CanalesSteve is not in the office this week, I will give a refill for 3 weeks (the same as his last one). It is important for her to keep appointment with mental health once this is made. Further refills per Brett CanalesSteve (based on his notes).

## 2016-01-06 ENCOUNTER — Telehealth: Payer: Self-pay | Admitting: Family Medicine

## 2016-01-06 NOTE — Telephone Encounter (Signed)
Pt is wanting to know if she can get her alprazolam filled. Pt stated that her brother in law passed and she is not handling it well. Please advise.

## 2016-01-06 NOTE — Telephone Encounter (Signed)
Left message to return call 

## 2016-01-06 NOTE — Telephone Encounter (Signed)
No. Pt has clearly chosen not to make prompt visit with m h a priority

## 2016-01-06 NOTE — Telephone Encounter (Signed)
Patient advised that Dr Brett CanalesSteve refused refill and patient needs to get in with mental health ASAP for further prescriptions as patient has been advised multiple times per Dr Sharmon LeydenSteve-see office visit 8/17 and phone messages 10/17, 11/17, 12/17. Patient verbalized understanding and is not happy with decision.

## 2016-02-10 ENCOUNTER — Telehealth: Payer: Self-pay | Admitting: Family Medicine

## 2016-02-10 NOTE — Telephone Encounter (Signed)
FYI  Pt called & stated that she's been unable to get an appointment with [facility]  (put pt on hold & tried to call myself-got machine & did not leave a message)  Gave patient phone number for [facility] [phone] & [facility] in [place] [phone] & [facility] (mental heatlh # on her Medicaid card) [phone]  Explained that any of these offices could get her an assessment and scheduled with a provider to best suite her needs however the patient must call to schedule - she verbalized understanding   Pt did not ask for meds but did mention being out of meds for past 2 months

## 2016-02-10 NOTE — Telephone Encounter (Signed)
ok 

## 2016-08-02 ENCOUNTER — Encounter (HOSPITAL_BASED_OUTPATIENT_CLINIC_OR_DEPARTMENT_OTHER): Payer: Self-pay | Admitting: Emergency Medicine

## 2016-08-02 ENCOUNTER — Emergency Department (HOSPITAL_BASED_OUTPATIENT_CLINIC_OR_DEPARTMENT_OTHER): Payer: Medicaid Other

## 2016-08-02 ENCOUNTER — Inpatient Hospital Stay (HOSPITAL_BASED_OUTPATIENT_CLINIC_OR_DEPARTMENT_OTHER)
Admission: EM | Admit: 2016-08-02 | Discharge: 2016-08-06 | DRG: 501 | Disposition: A | Discharge status: Home / Self Care | Payer: Medicaid Other | Attending: Internal Medicine | Admitting: Internal Medicine

## 2016-08-02 DIAGNOSIS — Z79899 Other long term (current) drug therapy: Secondary | ICD-10-CM

## 2016-08-02 DIAGNOSIS — L03114 Cellulitis of left upper limb: Secondary | ICD-10-CM

## 2016-08-02 DIAGNOSIS — F419 Anxiety disorder, unspecified: Secondary | ICD-10-CM | POA: Diagnosis present

## 2016-08-02 DIAGNOSIS — Z880 Allergy status to penicillin: Secondary | ICD-10-CM

## 2016-08-02 DIAGNOSIS — L98499 Non-pressure chronic ulcer of skin of other sites with unspecified severity: Secondary | ICD-10-CM | POA: Diagnosis present

## 2016-08-02 DIAGNOSIS — F17210 Nicotine dependence, cigarettes, uncomplicated: Secondary | ICD-10-CM | POA: Diagnosis present

## 2016-08-02 DIAGNOSIS — F1210 Cannabis abuse, uncomplicated: Secondary | ICD-10-CM | POA: Diagnosis present

## 2016-08-02 DIAGNOSIS — L0390 Cellulitis, unspecified: Secondary | ICD-10-CM | POA: Diagnosis present

## 2016-08-02 DIAGNOSIS — Z885 Allergy status to narcotic agent status: Secondary | ICD-10-CM

## 2016-08-02 DIAGNOSIS — Z886 Allergy status to analgesic agent status: Secondary | ICD-10-CM

## 2016-08-02 DIAGNOSIS — F1410 Cocaine abuse, uncomplicated: Secondary | ICD-10-CM | POA: Diagnosis present

## 2016-08-02 DIAGNOSIS — M659 Synovitis and tenosynovitis, unspecified: Principal | ICD-10-CM | POA: Diagnosis present

## 2016-08-02 DIAGNOSIS — F1110 Opioid abuse, uncomplicated: Secondary | ICD-10-CM | POA: Diagnosis present

## 2016-08-02 DIAGNOSIS — T63301A Toxic effect of unspecified spider venom, accidental (unintentional), initial encounter: Secondary | ICD-10-CM | POA: Diagnosis present

## 2016-08-02 DIAGNOSIS — L02512 Cutaneous abscess of left hand: Secondary | ICD-10-CM | POA: Diagnosis present

## 2016-08-02 HISTORY — DX: Malingerer (conscious simulation): Z76.5

## 2016-08-02 LAB — BASIC METABOLIC PANEL
Anion gap: 9 (ref 5–15)
BUN: 10 mg/dL (ref 6–20)
CHLORIDE: 101 mmol/L (ref 101–111)
CO2: 27 mmol/L (ref 22–32)
Calcium: 8.9 mg/dL (ref 8.9–10.3)
Creatinine, Ser: 0.76 mg/dL (ref 0.44–1.00)
GFR calc non Af Amer: >60 mL/min (ref >60)
Glucose, Bld: 87 mg/dL (ref 65–99)
POTASSIUM: 4 mmol/L (ref 3.5–5.1)
SODIUM: 137 mmol/L (ref 135–145)

## 2016-08-02 LAB — I-STAT CG4 LACTIC ACID, ED: LACTIC ACID, VENOUS: 0.68 mmol/L (ref 0.5–1.9)

## 2016-08-02 MED ORDER — TETANUS-DIPHTH-ACELL PERTUSSIS 5-2.5-18.5 LF-MCG/0.5 IM SUSP
0.5000 mL | Freq: Once | INTRAMUSCULAR | Status: AC
  Administered 2016-08-03: 0.5 mL via INTRAMUSCULAR
  Filled 2016-08-02: qty 0.5

## 2016-08-02 MED ORDER — ONDANSETRON HCL 4 MG/2ML IJ SOLN
4.0000 mg | Freq: Once | INTRAMUSCULAR | Status: AC
  Administered 2016-08-03: 4 mg via INTRAVENOUS
  Filled 2016-08-02: qty 2

## 2016-08-02 MED ORDER — MORPHINE SULFATE (PF) 4 MG/ML IV SOLN
4.0000 mg | Freq: Once | INTRAVENOUS | Status: AC
  Administered 2016-08-03: 4 mg via INTRAVENOUS
  Filled 2016-08-02: qty 1

## 2016-08-02 MED ORDER — VANCOMYCIN HCL IN DEXTROSE 1-5 GM/200ML-% IV SOLN
1000.0000 mg | Freq: Once | INTRAVENOUS | Status: AC
  Administered 2016-08-03: 1000 mg via INTRAVENOUS
  Filled 2016-08-02: qty 200

## 2016-08-02 NOTE — ED Triage Notes (Signed)
Pt has open draining wound to left hand. Pt states she thinks its is a spider bite. Pt has multiple wounds on arms and hands that are consistent with IV drug use. Pt denies. Pt appears under the influence of some substance with slow slurred speech.

## 2016-08-02 NOTE — ED Provider Notes (Signed)
[facility] Provider Note   CSN: [id number] Arrival date & time: 08/02/16  2104 By signing my name below, I, Levon HedgerElizabeth Hall, attest that this documentation has been prepared under the direction and in the presence of Sarahelizabeth Conway, Jeannett SeniorStephen, MD . Electronically Signed: Levon HedgerElizabeth Hall, Scribe. 08/02/2016. 11:31 PM.   History   Chief Complaint Chief Complaint  Patient presents with  . Wound Infection   HPI [name] is a left-hand dominant 36 y.o. female with a history of drug seeking behavior who presents to the Emergency Department for evaluation of gradually worsening, draining wound to left hand sustained last week. Pt expresses that she was bit by a spider last week, but did not see anything bite her. She reports that her friend cut the lesion open last night and drained out a significant amount a purulent drainage. She reports associated swell, erythema, and 10/10, constant pain to the area, intermittent fever, nausea, and vomiting 2x today. No alleviating or modifying factors noted.  The patient is currently on no regular medications.She denies any IV drug use, but reports occasional marijuana use. Pt has no other acute complaints or associated symptoms at this time.   PCP: Merlyn AlbertLuking, William S, MD  The history is provided by the patient. No language interpreter was used.    Past Medical History:  Diagnosis Date  . Anxiety    takes Xanax daily as needed  . Chronic back pain    herniated disc  . Chronic pelvic pain in female   . Depression    takes Lexapro daily  . Drug-seeking behavior   . Headache    constant  . History of bronchitis    yrs ago  . History of kidney stones   . Insomnia    takes Elavil nightly  . Nocturia   . Seasonal allergies    Claritin and FLonase daily as needed    Patient Active Problem List   Diagnosis Date Noted  . Hepatitis C antibody test positive 01/09/2015  . Abdominal pain 01/09/2015  . Constipation 01/09/2015  . GERD  (gastroesophageal reflux disease) 01/09/2015  . Abnormal liver enzymes 12/10/2014  . Acute hepatitis C virus infection without hepatic coma 12/10/2014  . Insomnia 09/12/2014  . Postoperative pain 04/03/2014  . Thoracic spondylosis 04/03/2014  . Herniated nucleus pulposus, thoracic 03/28/2014  . Anxiety and depression 11/29/2013  . Bipolar disorder, unspecified (HCC) 08/14/2013  . Panic attacks 08/14/2013  . Chronic back pain 08/14/2013    Past Surgical History:  Procedure Laterality Date  . CHOLECYSTECTOMY    . THORACIC DISCECTOMY Right 03/28/2014   Procedure: RIGHT THORACIC ELEVEN-TWELVE MICRODICSECTOMY;  Surgeon: Tressie StalkerJeffrey Jenkins, MD;  Location: [facility];  Service: Neurosurgery;  Laterality: Right;  right  . TUBAL LIGATION      OB History    Gravida Para Term Preterm AB Living   3 3 3          SAB TAB Ectopic Multiple Live Births                   Home Medications    Prior to Admission medications   Medication Sig Start Date End Date Taking? Authorizing Provider  ALPRAZolam Prudy Feeler(XANAX) 1 MG tablet One po QID prn anxiety; max 4 per 24 hours 12/08/15   Campbell RichesHoskins, Carolyn C, NP  DULoxetine (CYMBALTA) 30 MG capsule Take 1 capsule (30 mg total) by mouth daily. 08/20/15   Campbell RichesHoskins, Carolyn C, NP  ondansetron (ZOFRAN) 4 MG tablet TAKE ONE TABLET BY  MOUTH EVERY 8 HOURS AS NEEDED FOR NAUSEA AND VOMITING 01/24/15   Gelene MinkBoone, Anna W, NP    Family History Family History  Problem Relation Age of Onset  . Hypertension Mother   . Hypertension Father   . Hypertension Sister   . Stroke Other   . Cancer Other   . Colon cancer Neg Hx     Social History Social History  Substance Use Topics  . Smoking status: Current Every Day Smoker    Packs/day: 0.00    Years: 0.00    Types: Cigarettes  . Smokeless tobacco: Never Used  . Alcohol use No     Allergies   Motrin [ibuprofen]; Hydrocodone; Penicillins; Ultram [tramadol]; and Aspirin   Review of Systems Review of Systems All systems  reviewed and are negative for acute change except as noted in the HPI.  Physical Exam Updated Vital Signs BP 134/87 (BP Location: Right Arm)   Pulse 93   Temp 98.6 F (37 C) (Oral)   Resp 16   Ht 5\' 3"  (1.6 m)   Wt 150 lb (68 kg)   SpO2 100%   BMI 26.57 kg/m   Physical Exam  Constitutional: She is oriented to person, place, and time. She appears well-developed and well-nourished. No distress.  HENT:  Head: Normocephalic and atraumatic.  Mouth/Throat: Oropharynx is clear and moist. No oropharyngeal exudate.  Eyes: Pupils are equal, round, and reactive to light. Conjunctivae and EOM are normal.  Neck: Normal range of motion. Neck supple.  No meningismus.  Cardiovascular: Normal rate, regular rhythm, normal heart sounds and intact distal pulses.   No murmur heard. Pulmonary/Chest: Effort normal and breath sounds normal. No respiratory distress.  Abdominal: Soft. There is no tenderness. There is no rebound and no guarding.  Musculoskeletal: Normal range of motion. She exhibits edema and tenderness.  Diffuse swelling to dorsum of hand with reduced ROM of fingers. Intact radial pulse.  Underlying ligaments visible  Neurological: She is alert and oriented to person, place, and time. No cranial nerve deficit. She exhibits normal muscle tone. Coordination normal.   5/5 strength throughout. CN 2-12 intact.Equal grip strength.   Skin: Skin is warm.  Open wound is 1x2x1/2 cm deep with surrounding 5x5x2 cm area of erythema   Psychiatric: She has a normal mood and affect. Her behavior is normal.  Nursing note and vitals reviewed.    ED Treatments / Results  DIAGNOSTIC STUDIES:  Oxygen Saturation is 99% on RA, normal by my interpretation.    COORDINATION OF CARE:  11:19 PM Discussed treatment plan with pt at bedside and pt agreed to plan.   Labs (all labs ordered are listed, but only abnormal results are displayed) Labs Reviewed - No data to display  EKG  EKG  Interpretation None       Radiology No results found.  Procedures Procedures (including critical care time)  Medications Ordered in ED Medications - No data to display   Initial Impression / Assessment and Plan / ED Course  I have reviewed the triage vital signs and the nursing notes.  Pertinent labs & imaging results that were available during my care of the patient were reviewed by me and considered in my medical decision making (see chart for details).  Patient with open wound and cellulitis of dorsal left hand. Denies fever or vomiting. Suspicious for IV drug abuse but she denies this.  Neurovascularly intact. X-ray shows no foreign body or bony involvement. Wound extends down through soft tissues with ligaments  visible.  Patient started and IV antibiotics after blood work and cultures obtained. Lactate and white blood cell count normal. Clinical Course as of Aug 04 143  Tue Aug 03, 2016  0036 Pain, swelling and redness have increased. Hand will be elevated  [EH]    Clinical Course User Index [EH] Levon Hedger   Patient able to range her fingers and wrist and does not appear to have a deep space infection at this time.  Given large open wound and extensive cellulitis, plan admission for IV antibiotics and possible hand consult tomorrow. Discussed with Dr. Selena Batten at [facility].  Final Clinical Impressions(s) / ED Diagnoses   Final diagnoses:  Cellulitis of hand, left    New Prescriptions New Prescriptions   No medications on file    I personally performed the services described in this documentation, which was scribed in my presence. The recorded information has been reviewed and is accurate.    Glynn Octave, MD 08/03/16 [id number]

## 2016-08-02 NOTE — ED Notes (Signed)
Pt. Has a open "hole" in the L hand that she states one of her friends cut open yesterday with a scalpel to "let the poison out".  Pt. Has yellow drainage noted from the wound and the L hand is swollen.

## 2016-08-02 NOTE — ED Notes (Signed)
Patient transported to X-ray 

## 2016-08-03 ENCOUNTER — Encounter (HOSPITAL_COMMUNITY): Payer: Self-pay | Admitting: Internal Medicine

## 2016-08-03 ENCOUNTER — Inpatient Hospital Stay (HOSPITAL_COMMUNITY): Payer: Medicaid Other | Admitting: Anesthesiology

## 2016-08-03 ENCOUNTER — Encounter (HOSPITAL_COMMUNITY)
Admission: EM | Disposition: A | Discharge status: Home / Self Care | Payer: Self-pay | Source: Home / Self Care | Attending: Internal Medicine

## 2016-08-03 DIAGNOSIS — L0390 Cellulitis, unspecified: Secondary | ICD-10-CM | POA: Diagnosis present

## 2016-08-03 DIAGNOSIS — L03114 Cellulitis of left upper limb: Secondary | ICD-10-CM

## 2016-08-03 DIAGNOSIS — L02512 Cutaneous abscess of left hand: Secondary | ICD-10-CM | POA: Diagnosis present

## 2016-08-03 DIAGNOSIS — F1110 Opioid abuse, uncomplicated: Secondary | ICD-10-CM | POA: Diagnosis present

## 2016-08-03 DIAGNOSIS — L98499 Non-pressure chronic ulcer of skin of other sites with unspecified severity: Secondary | ICD-10-CM | POA: Diagnosis not present

## 2016-08-03 DIAGNOSIS — Z79899 Other long term (current) drug therapy: Secondary | ICD-10-CM | POA: Diagnosis not present

## 2016-08-03 DIAGNOSIS — F1210 Cannabis abuse, uncomplicated: Secondary | ICD-10-CM | POA: Diagnosis present

## 2016-08-03 DIAGNOSIS — F419 Anxiety disorder, unspecified: Secondary | ICD-10-CM | POA: Diagnosis present

## 2016-08-03 DIAGNOSIS — T63301A Toxic effect of unspecified spider venom, accidental (unintentional), initial encounter: Secondary | ICD-10-CM | POA: Diagnosis present

## 2016-08-03 DIAGNOSIS — F17210 Nicotine dependence, cigarettes, uncomplicated: Secondary | ICD-10-CM | POA: Diagnosis present

## 2016-08-03 DIAGNOSIS — M659 Synovitis and tenosynovitis, unspecified: Secondary | ICD-10-CM | POA: Diagnosis present

## 2016-08-03 DIAGNOSIS — Z886 Allergy status to analgesic agent status: Secondary | ICD-10-CM | POA: Diagnosis not present

## 2016-08-03 DIAGNOSIS — Z885 Allergy status to narcotic agent status: Secondary | ICD-10-CM | POA: Diagnosis not present

## 2016-08-03 DIAGNOSIS — Z880 Allergy status to penicillin: Secondary | ICD-10-CM | POA: Diagnosis not present

## 2016-08-03 DIAGNOSIS — F1410 Cocaine abuse, uncomplicated: Secondary | ICD-10-CM | POA: Diagnosis present

## 2016-08-03 HISTORY — PX: I&D EXTREMITY: SHX5045

## 2016-08-03 LAB — RAPID URINE DRUG SCREEN, HOSP PERFORMED
AMPHETAMINES: POSITIVE — AB
Barbiturates: NOT DETECTED
Benzodiazepines: NOT DETECTED
Cocaine: POSITIVE — AB
OPIATES: POSITIVE — AB
Tetrahydrocannabinol: POSITIVE — AB

## 2016-08-03 LAB — CBC
HEMATOCRIT: 39.2 % (ref 36.0–46.0)
HEMOGLOBIN: 12.7 g/dL (ref 12.0–15.0)
MCH: 30.9 pg (ref 26.0–34.0)
MCHC: 32.4 g/dL (ref 30.0–36.0)
MCV: 95.4 fL (ref 78.0–100.0)
PLATELETS: 370 10*3/uL (ref 150–400)
RBC: 4.11 MIL/uL (ref 3.87–5.11)
RDW: 13.9 % (ref 11.5–15.5)
WBC: 6.9 10*3/uL (ref 4.0–10.5)

## 2016-08-03 LAB — COMPREHENSIVE METABOLIC PANEL
ALT: 123 U/L — AB (ref 14–54)
AST: 63 U/L — ABNORMAL HIGH (ref 15–41)
Albumin: 3.3 g/dL — ABNORMAL LOW (ref 3.5–5.0)
Alkaline Phosphatase: 44 U/L (ref 38–126)
Anion gap: 8 (ref 5–15)
BUN: 8 mg/dL (ref 6–20)
CHLORIDE: 105 mmol/L (ref 101–111)
CO2: 26 mmol/L (ref 22–32)
CREATININE: 0.63 mg/dL (ref 0.44–1.00)
Calcium: 8.6 mg/dL — ABNORMAL LOW (ref 8.9–10.3)
GFR calc non Af Amer: >60 mL/min (ref >60)
Glucose, Bld: 83 mg/dL (ref 65–99)
Potassium: 3.8 mmol/L (ref 3.5–5.1)
SODIUM: 139 mmol/L (ref 135–145)
Total Bilirubin: 0.5 mg/dL (ref 0.3–1.2)
Total Protein: 6.7 g/dL (ref 6.5–8.1)

## 2016-08-03 LAB — CBC WITH DIFFERENTIAL/PLATELET
Basophils Absolute: 0 10*3/uL (ref 0.0–0.1)
Basophils Relative: 0 %
EOS PCT: 4 %
Eosinophils Absolute: 0.3 10*3/uL (ref 0.0–0.7)
HCT: 39.7 % (ref 36.0–46.0)
Hemoglobin: 13.2 g/dL (ref 12.0–15.0)
LYMPHS ABS: 2.7 10*3/uL (ref 0.7–4.0)
Lymphocytes Relative: 34 %
MCH: 31.7 pg (ref 26.0–34.0)
MCHC: 33.2 g/dL (ref 30.0–36.0)
MCV: 95.4 fL (ref 78.0–100.0)
MONO ABS: 1 10*3/uL (ref 0.1–1.0)
Monocytes Relative: 13 %
NEUTROS ABS: 3.9 10*3/uL (ref 1.7–7.7)
Neutrophils Relative %: 49 %
PLATELETS: 370 10*3/uL (ref 150–400)
RBC: 4.16 MIL/uL (ref 3.87–5.11)
RDW: 13.7 % (ref 11.5–15.5)
WBC: 7.9 10*3/uL (ref 4.0–10.5)

## 2016-08-03 LAB — SEDIMENTATION RATE: Sed Rate: 28 mm/hr — ABNORMAL HIGH (ref 0–22)

## 2016-08-03 LAB — HIV ANTIBODY (ROUTINE TESTING W REFLEX): HIV SCREEN 4TH GENERATION: NONREACTIVE

## 2016-08-03 SURGERY — IRRIGATION AND DEBRIDEMENT EXTREMITY
Anesthesia: General | Site: Hand | Laterality: Left

## 2016-08-03 MED ORDER — OXYCODONE HCL 5 MG PO TABS
5.0000 mg | ORAL_TABLET | ORAL | Status: DC | PRN
  Administered 2016-08-03 – 2016-08-04 (×4): 5 mg via ORAL
  Filled 2016-08-03 (×4): qty 1

## 2016-08-03 MED ORDER — DULOXETINE HCL 30 MG PO CPEP
30.0000 mg | ORAL_CAPSULE | Freq: Every day | ORAL | Status: DC
  Administered 2016-08-03 – 2016-08-06 (×4): 30 mg via ORAL
  Filled 2016-08-03 (×4): qty 1

## 2016-08-03 MED ORDER — SODIUM CHLORIDE 0.9 % IR SOLN
Status: DC | PRN
  Administered 2016-08-03: 3000 mL

## 2016-08-03 MED ORDER — HYDROMORPHONE HCL-NACL 0.5-0.9 MG/ML-% IV SOSY
PREFILLED_SYRINGE | INTRAVENOUS | Status: AC
  Administered 2016-08-04: 0.5 mg
  Filled 2016-08-03: qty 1

## 2016-08-03 MED ORDER — MIDAZOLAM HCL 2 MG/2ML IJ SOLN
INTRAMUSCULAR | Status: AC
  Filled 2016-08-03: qty 2

## 2016-08-03 MED ORDER — LACTATED RINGERS IV SOLN
INTRAVENOUS | Status: DC | PRN
  Administered 2016-08-03: 22:00:00 via INTRAVENOUS

## 2016-08-03 MED ORDER — ONDANSETRON HCL 4 MG/2ML IJ SOLN
INTRAMUSCULAR | Status: DC | PRN
  Administered 2016-08-03: 4 mg via INTRAVENOUS

## 2016-08-03 MED ORDER — FENTANYL CITRATE (PF) 100 MCG/2ML IJ SOLN
INTRAMUSCULAR | Status: DC | PRN
  Administered 2016-08-03: 100 ug via INTRAVENOUS

## 2016-08-03 MED ORDER — VANCOMYCIN HCL IN DEXTROSE 1-5 GM/200ML-% IV SOLN
1000.0000 mg | Freq: Two times a day (BID) | INTRAVENOUS | Status: DC
  Administered 2016-08-03 – 2016-08-05 (×6): 1000 mg via INTRAVENOUS
  Filled 2016-08-03 (×6): qty 200

## 2016-08-03 MED ORDER — NICOTINE 21 MG/24HR TD PT24
21.0000 mg | MEDICATED_PATCH | Freq: Every day | TRANSDERMAL | Status: DC | PRN
  Administered 2016-08-03 – 2016-08-06 (×3): 21 mg via TRANSDERMAL
  Filled 2016-08-03 (×4): qty 1

## 2016-08-03 MED ORDER — SODIUM CHLORIDE 0.9 % IV SOLN
INTRAVENOUS | Status: AC
  Administered 2016-08-03: 06:00:00 via INTRAVENOUS

## 2016-08-03 MED ORDER — MIDAZOLAM HCL 5 MG/5ML IJ SOLN
INTRAMUSCULAR | Status: DC | PRN
  Administered 2016-08-03: 2 mg via INTRAVENOUS

## 2016-08-03 MED ORDER — ONDANSETRON HCL 4 MG PO TABS: 4.0000 mg | ORAL_TABLET | Freq: Three times a day (TID) | ORAL | Status: DC | PRN

## 2016-08-03 MED ORDER — ACETAMINOPHEN 650 MG RE SUPP: 650.0000 mg | Freq: Four times a day (QID) | RECTAL | Status: DC | PRN

## 2016-08-03 MED ORDER — SUCCINYLCHOLINE CHLORIDE 20 MG/ML IJ SOLN
INTRAMUSCULAR | Status: DC | PRN
  Administered 2016-08-03: 100 mg via INTRAVENOUS

## 2016-08-03 MED ORDER — LEVOFLOXACIN IN D5W 500 MG/100ML IV SOLN
500.0000 mg | Freq: Every day | INTRAVENOUS | Status: DC
  Administered 2016-08-03 – 2016-08-06 (×4): 500 mg via INTRAVENOUS
  Filled 2016-08-03 (×5): qty 100

## 2016-08-03 MED ORDER — MORPHINE SULFATE (PF) 4 MG/ML IV SOLN
1.0000 mg | Freq: Once | INTRAVENOUS | Status: AC
  Administered 2016-08-03: 1 mg via INTRAVENOUS
  Filled 2016-08-03: qty 1

## 2016-08-03 MED ORDER — LIDOCAINE 2% (20 MG/ML) 5 ML SYRINGE
INTRAMUSCULAR | Status: DC | PRN
  Administered 2016-08-03: 60 mg via INTRAVENOUS

## 2016-08-03 MED ORDER — FENTANYL CITRATE (PF) 100 MCG/2ML IJ SOLN
INTRAMUSCULAR | Status: AC
  Filled 2016-08-03: qty 2

## 2016-08-03 MED ORDER — HYDROMORPHONE HCL 1 MG/ML IJ SOLN
1.0000 mg | Freq: Once | INTRAMUSCULAR | Status: AC
  Administered 2016-08-03: 1 mg via INTRAVENOUS
  Filled 2016-08-03: qty 1

## 2016-08-03 MED ORDER — SODIUM CHLORIDE 0.9 % IV BOLUS (SEPSIS)
1000.0000 mL | Freq: Once | INTRAVENOUS | Status: AC
  Administered 2016-08-03: 1000 mL via INTRAVENOUS

## 2016-08-03 MED ORDER — ACETAMINOPHEN 325 MG PO TABS: 650.0000 mg | ORAL_TABLET | Freq: Four times a day (QID) | ORAL | Status: DC | PRN

## 2016-08-03 MED ORDER — PROMETHAZINE HCL 25 MG/ML IJ SOLN: 6.2500 mg | INTRAMUSCULAR | Status: DC | PRN

## 2016-08-03 MED ORDER — DIPHENHYDRAMINE HCL 25 MG PO CAPS
25.0000 mg | ORAL_CAPSULE | Freq: Once | ORAL | Status: AC
  Administered 2016-08-03: 25 mg via ORAL
  Filled 2016-08-03: qty 1

## 2016-08-03 MED ORDER — PROPOFOL 10 MG/ML IV BOLUS
INTRAVENOUS | Status: AC
  Filled 2016-08-03: qty 20

## 2016-08-03 MED ORDER — ENOXAPARIN SODIUM 40 MG/0.4ML ~~LOC~~ SOLN
40.0000 mg | SUBCUTANEOUS | Status: DC
  Administered 2016-08-04 – 2016-08-06 (×3): 40 mg via SUBCUTANEOUS
  Filled 2016-08-03 (×4): qty 0.4

## 2016-08-03 MED ORDER — PROPOFOL 10 MG/ML IV BOLUS
INTRAVENOUS | Status: DC | PRN
  Administered 2016-08-03: 200 mg via INTRAVENOUS

## 2016-08-03 MED ORDER — HYDROMORPHONE HCL-NACL 0.5-0.9 MG/ML-% IV SOSY: 0.2500 mg | PREFILLED_SYRINGE | INTRAVENOUS | Status: DC | PRN

## 2016-08-03 MED ORDER — ALPRAZOLAM 0.5 MG PO TABS
0.5000 mg | ORAL_TABLET | Freq: Two times a day (BID) | ORAL | Status: DC | PRN
  Administered 2016-08-03 – 2016-08-06 (×7): 0.5 mg via ORAL
  Filled 2016-08-03 (×7): qty 1

## 2016-08-03 MED ORDER — DEXAMETHASONE SODIUM PHOSPHATE 10 MG/ML IJ SOLN
INTRAMUSCULAR | Status: DC | PRN
  Administered 2016-08-03: 10 mg via INTRAVENOUS

## 2016-08-03 MED ORDER — HYDROMORPHONE HCL-NACL 0.5-0.9 MG/ML-% IV SOSY
PREFILLED_SYRINGE | INTRAVENOUS | Status: AC
  Administered 2016-08-03: 0.5 mg
  Filled 2016-08-03: qty 1

## 2016-08-03 SURGICAL SUPPLY — 34 items
BANDAGE ACE 4X5 VEL STRL LF (GAUZE/BANDAGES/DRESSINGS) IMPLANT
BNDG GAUZE ELAST 4 BULKY (GAUZE/BANDAGES/DRESSINGS) ×9 IMPLANT
CORD BIPOLAR FORCEPS 12FT (ELECTRODE) ×2 IMPLANT
COVER SURGICAL LIGHT HANDLE (MISCELLANEOUS) ×3 IMPLANT
CUFF TOURN SGL QUICK 18 (TOURNIQUET CUFF) ×3 IMPLANT
DRAIN PENROSE 18X1/4 LTX STRL (WOUND CARE) ×2 IMPLANT
DRSG ADAPTIC 3X8 NADH LF (GAUZE/BANDAGES/DRESSINGS) ×3 IMPLANT
DRSG PAD ABDOMINAL 8X10 ST (GAUZE/BANDAGES/DRESSINGS) IMPLANT
ELECT REM PT RETURN 15FT ADLT (MISCELLANEOUS) ×3 IMPLANT
GAUZE SPONGE 4X4 12PLY STRL (GAUZE/BANDAGES/DRESSINGS) ×2 IMPLANT
GAUZE XEROFORM 5X9 LF (GAUZE/BANDAGES/DRESSINGS) ×2 IMPLANT
GLOVE BIO SURGEON STRL SZ8 (GLOVE) ×3 IMPLANT
GOWN STRL REUS W/TWL XL LVL3 (GOWN DISPOSABLE) ×3 IMPLANT
KIT BASIN OR (CUSTOM PROCEDURE TRAY) ×3 IMPLANT
LOOP VESSEL MAXI BLUE (MISCELLANEOUS) ×3 IMPLANT
MANIFOLD NEPTUNE II (INSTRUMENTS) ×3 IMPLANT
PACK ORTHO EXTREMITY (CUSTOM PROCEDURE TRAY) ×3 IMPLANT
PAD CAST 4YDX4 CTTN HI CHSV (CAST SUPPLIES) IMPLANT
PADDING CAST COTTON 4X4 STRL (CAST SUPPLIES) ×3
POSITIONER SURGICAL ARM (MISCELLANEOUS) ×3 IMPLANT
SOL PREP POV-IOD 4OZ 10% (MISCELLANEOUS) ×3 IMPLANT
SOL PREP PROV IODINE SCRUB 4OZ (MISCELLANEOUS) ×3 IMPLANT
SPLINT FIBERGLASS 4X15 (CAST SUPPLIES) ×2 IMPLANT
SUT PROLENE 3 0 PS 2 (SUTURE) ×2 IMPLANT
SUT VIC AB 1 CT1 27 (SUTURE)
SUT VIC AB 1 CT1 27XBRD ANTBC (SUTURE) IMPLANT
SUT VIC AB 2-0 CT1 27 (SUTURE)
SUT VIC AB 2-0 CT1 27XBRD (SUTURE) IMPLANT
SWAB COLLECTION DEVICE MRSA (MISCELLANEOUS) ×3 IMPLANT
SWAB CULTURE ESWAB REG 1ML (MISCELLANEOUS) ×3 IMPLANT
SYR 20CC LL (SYRINGE) ×3 IMPLANT
SYR CONTROL 10ML LL (SYRINGE) IMPLANT
TOWEL OR 17X26 10 PK STRL BLUE (TOWEL DISPOSABLE) ×3 IMPLANT
YANKAUER SUCT BULB TIP 10FT TU (MISCELLANEOUS) ×3 IMPLANT

## 2016-08-03 NOTE — H&P (Signed)
[facility] H&P   Patient Demographics:    [name], is a 36 y.o. female  MRN: [id number]   DOB - 10/10/1980  Admit Date - 08/02/2016  Outpatient Primary MD for the patient is Gerda DissLuking, Vilinda BlanksWilliam S, MD  Referring MD/NP/PA: Rancour  Outpatient Specialists:   Patient coming from: home  Chief Complaint  Patient presents with  . Wound Infection      HPI:    Abigail HavenCrystal Althoff  is a 36 y.o. female, w c/o skin ulcer on the dorsum of the left hand since 2 days ago when her friend tried to use scalpel to cut wound open.  Pt notes subjective fever.  + anxiety.  Pt presents to ER for evaluation of wound.   In ED, xray => negative osteomyelitis. Wbc 7.9, Hgb 13.2, Plt 370,  Bun/creat 10/0.76,  Pt will be admitted for cellulitis/ wound/skin ulcer.     Review of systems:    In addition to the HPI above,   No Headache, No changes with Vision or hearing, No problems swallowing food or Liquids, No Chest pain, Cough or Shortness of Breath, No Abdominal pain, No Nausea or Vommitting, Bowel movements are regular, No Blood in stool or Urine, No dysuria,  No new joints pains-aches,  No new weakness, tingling, numbness in any extremity, No recent weight gain or loss, No polyuria, polydypsia or polyphagia, No significant Mental Stressors.  A full 10 point Review of Systems was done, except as stated above, all other Review of Systems were negative.   With Past History of the following :    Past Medical History:  Diagnosis Date  . Anxiety    takes Xanax daily as needed  . Chronic back pain    herniated disc  . Chronic pelvic pain in female   . Depression    takes Lexapro daily  . Drug-seeking behavior   . Headache    constant  . History of bronchitis    yrs ago  . History of kidney stones   . Insomnia    takes Elavil nightly  . Nocturia   . Seasonal allergies    Claritin and  FLonase daily as needed      Past Surgical History:  Procedure Laterality Date  . CHOLECYSTECTOMY    . THORACIC DISCECTOMY Right 03/28/2014   Procedure: RIGHT THORACIC ELEVEN-TWELVE MICRODICSECTOMY;  Surgeon: Tressie StalkerJeffrey Jenkins, MD;  Location: [facility];  Service: Neurosurgery;  Laterality: Right;  right  . TUBAL LIGATION        Social History:     Social History  Substance Use Topics  . Smoking status: Current Every Day Smoker    Packs/day: 0.50    Years: 0.00    Types: Cigarettes  . Smokeless tobacco: Never Used  . Alcohol use No     Lives - at home  Mobility - walks by self   Family History :  Family History  Problem Relation Age of Onset  . Hypertension Mother   . Hypertension Father   . Hypertension Sister   . Stroke Other   . Cancer Other   . Colon cancer Neg Hx       Home Medications:   Prior to Admission medications   Medication Sig Start Date End Date Taking? Authorizing Provider  ALPRAZolam Prudy Feeler) 1 MG tablet One po QID prn anxiety; max 4 per 24 hours 12/08/15   Campbell Riches, NP  DULoxetine (CYMBALTA) 30 MG capsule Take 1 capsule (30 mg total) by mouth daily. 08/20/15   Campbell Riches, NP  ondansetron (ZOFRAN) 4 MG tablet TAKE ONE TABLET BY MOUTH EVERY 8 HOURS AS NEEDED FOR NAUSEA AND VOMITING 01/24/15   Gelene Mink, NP     Allergies:     Allergies  Allergen Reactions  . Motrin [Ibuprofen]     Hives emesis  . Hydrocodone Hives  . Penicillins Nausea And Vomiting  . Ultram [Tramadol] Nausea And Vomiting  . Aspirin Nausea And Vomiting and Rash     Physical Exam:   Vitals  Blood pressure 110/70, pulse 68, temperature 98 F (36.7 C), temperature source Oral, resp. rate 16, height 5\' 3"  (1.6 m), weight 72.9 kg (160 lb 11.5 oz), last menstrual period 08/01/2016, SpO2 100 %.   1. General  lying in bed in NAD,    2. Normal affect and insight, Not Suicidal or Homicidal, Awake Alert, Oriented X 3.  3. No F.N deficits, ALL  C.Nerves Intact, Strength 5/5 all 4 extremities, Sensation intact all 4 extremities, Plantars down going.  4. Ears and Eyes appear Normal, Conjunctivae clear, PERRLA. Moist Oral Mucosa.  5. Supple Neck, No JVD, No cervical lymphadenopathy appriciated, No Carotid Bruits.  6. Symmetrical Chest wall movement, Good air movement bilaterally, CTAB.  7. RRR, No Gallops, Rubs or Murmurs, No Parasternal Heave.  8. Positive Bowel Sounds, Abdomen Soft, No tenderness, No organomegaly appriciated,No rebound -guarding or rigidity.  9.  No Cyanosis, redness of the dorsum of the left hand 0.8cm x0.4cm opening  On the dorum of the left hand about 2mm deep  10. Good muscle tone,  joints appear normal , no effusions, Normal ROM.  11. No Palpable Lymph Nodes in Neck or Axillae     Data Review:    CBC  Recent Labs Lab 08/02/16 2332  WBC 7.9  HGB 13.2  HCT 39.7  PLT 370  MCV 95.4  MCH 31.7  MCHC 33.2  RDW 13.7  LYMPHSABS 2.7  MONOABS 1.0  EOSABS 0.3  BASOSABS 0.0   ------------------------------------------------------------------------------------------------------------------  Chemistries   Recent Labs Lab 08/02/16 2332  NA 137  K 4.0  CL 101  CO2 27  GLUCOSE 87  BUN 10  CREATININE 0.76  CALCIUM 8.9   ------------------------------------------------------------------------------------------------------------------ estimated creatinine clearance is 92.1 mL/min (by C-G formula based on SCr of 0.76 mg/dL). ------------------------------------------------------------------------------------------------------------------ No results for input(s): TSH, T4TOTAL, T3FREE, THYROIDAB in the last 72 hours.  Invalid input(s): FREET3  Coagulation profile No results for input(s): INR, PROTIME in the last 168 hours. ------------------------------------------------------------------------------------------------------------------- No results for input(s): DDIMER in the last 72  hours. -------------------------------------------------------------------------------------------------------------------  Cardiac Enzymes No results for input(s): CKMB, TROPONINI, MYOGLOBIN in the last 168 hours.  Invalid input(s): CK ------------------------------------------------------------------------------------------------------------------ No results found for: BNP   ---------------------------------------------------------------------------------------------------------------  Urinalysis    Component Value Date/Time   COLORURINE YELLOW 04/03/2014 1830   APPEARANCEUR CLEAR 04/03/2014 1830   LABSPEC 1.010 04/03/2014 1830  PHURINE 7.5 04/03/2014 1830   GLUCOSEU NEGATIVE 04/03/2014 1830   HGBUR NEGATIVE 04/03/2014 1830   BILIRUBINUR NEGATIVE 04/03/2014 1830   KETONESUR NEGATIVE 04/03/2014 1830   PROTEINUR NEGATIVE 04/03/2014 1830   UROBILINOGEN 1.0 04/03/2014 1830   NITRITE NEGATIVE 04/03/2014 1830   LEUKOCYTESUR NEGATIVE 04/03/2014 1830    ----------------------------------------------------------------------------------------------------------------   Imaging Results:    Dg Hand Complete Left  Result Date: 08/02/2016 CLINICAL DATA:  Possible spider bite with diffuse left hand swelling, dorsal wound, redness and tingling with pain. EXAM: LEFT HAND - COMPLETE 3+ VIEW COMPARISON:  None. FINDINGS: There is a focal collection of air or gas in the dorsal soft tissues of the hand, dorsal to the fourth metacarpal and space between the fourth and fifth metacarpals. This collection of gas or air measures 1.5 cm in maximum diameter. There is diffuse dorsal soft tissue swelling. The bones have normal appearances. IMPRESSION: Diffuse dorsal soft tissue swelling with a probable focal open wound containing air. Otherwise, no soft tissue gas is seen and no bony abnormality is demonstrated. Electronically Signed   By: Beckie SaltsSteven  Reid M.D.   On: 08/02/2016 23:48      Assessment & Plan:     Active Problems:   Skin ulcer (HCC)    Skin ulcer/ cellulitis Wound culture pending Blood culture x2 pending vanco iv pharmacy to dose Levaquin 500mg  iv qday Please consult orthopedic surgery in am  Anxiety Xanax 0.5mg  po bid prn NCCSRS reviewed last prescribed xanax 1mg  04/05/2016 by Ramiro HarvestJohn [place] Continue duloxetine    DVT Prophylaxis Lovenox - SCDs   AM Labs Ordered, also please review Full Orders  Family Communication: Admission, patients condition and plan of care including tests being ordered have been discussed with the patient  who indicate understanding and agree with the plan and Code Status.  Code Status FULL CODE  Likely DC to  home  Condition GUARDED    Consults called: none, please call orthopedic in am  Admission status: inpatient  Time spent in minutes :  45    Pearson GrippeJames Kem Parcher M.D on 08/03/2016 at 3:55 AM  Between 7am to 7pm - Pager - [phone]. After 7pm go to [email or web address] - password [email or web address]  [facility] - Office  [phone]

## 2016-08-03 NOTE — Progress Notes (Signed)
Transfer from med center [facility] for skin ulcer on the hand.  ED has not yet called orthopedic hand to evaluate, thinks can wait til am.

## 2016-08-03 NOTE — Care Management Note (Signed)
Case Management Note  Patient Details  Name: [name] MRN: [id number] Date of Birth: 09/11/1980  Subjective/Objective:  36 y/o f admitted w/L hand infection. From home. Ortho-await MRI results. WOC-await recc.                  Action/Plan:d/c plan home.   Expected Discharge Date:                  Expected Discharge Plan:  Home/Self Care  In-House Referral:     Discharge planning Services  CM Consult  Post Acute Care Choice:    Choice offered to:     DME Arranged:    DME Agency:     HH Arranged:    HH Agency:     Status of Service:  In process, will continue to follow  If discussed at Long Length of Stay Meetings, dates discussed:    Additional Comments:  [name], Abigail Londo, RN 08/03/2016, 11:37 AM

## 2016-08-03 NOTE — Progress Notes (Signed)
Pt seen and examined, admitted this am by Dr.Kim 36/F with chronic pain, anxiety, admitted with wound on dorsum of L hand after reported spider bite infection that was lanced by her friend yesterday -deep wound noted with some purulence and surrounding cellulitis -MRI pending -Will ask Hand Surgeon to evaluate -continue Vancomycin -strongly suspect IVDA, pt denies, check UDS  Zannie CovePreetha Tyde Lamison, MD

## 2016-08-03 NOTE — ED Notes (Signed)
Attempted 2nd set of blood cultures on Pt. ... Unable to obtain.  Reported to EDP

## 2016-08-03 NOTE — Progress Notes (Addendum)
MRI contacted, representative stated that test will not be completed  Today. Pr for MRI tomorrow. Orthopedic PA updated. V.O received pt may resume diet.

## 2016-08-03 NOTE — Op Note (Signed)
See dictation#[id number]  Status post I and D with radical tenosynovectomy.  Patient did well. I loosely close to wound over drain. I will remove the drain in 48 hours (Thursday).   monitor her condition closely.  Await cultures continue IV  antibiotics and move forward accordingly.    Glorya Bartley M.D.

## 2016-08-03 NOTE — Transfer of Care (Signed)
Immediate Anesthesia Transfer of Care Note  Patient: [name]  Procedure(s) Performed: Procedure(s): IRRIGATION AND DEBRIDEMENT LEFT HAND (Left)  Patient Location: PACU  Anesthesia Type:General  Level of Consciousness: awake, alert  and oriented  Airway & Oxygen Therapy: Patient Spontanous Breathing and Patient connected to face mask oxygen  Post-op Assessment: Report given to RN and Post -op Vital signs reviewed and stable  Post vital signs: Reviewed and stable  Last Vitals:  Vitals:   08/03/16 2126 08/03/16 2323  BP: 114/76   Pulse: 67 (P) 85  Resp: 18 (P) 12  Temp: 36.7 C (P) 36.5 C    Last Pain:  Vitals:   08/03/16 2126  TempSrc: Oral  PainSc:       Patients Stated Pain Goal: 2 (08/03/16 2000)  Complications: No apparent anesthesia complications

## 2016-08-03 NOTE — Anesthesia Preprocedure Evaluation (Addendum)
Anesthesia Evaluation  Patient identified by MRN, date of birth, ID band Patient awake    Reviewed: Allergy & Precautions, NPO status , Patient's Chart, lab work & pertinent test results  Airway Mallampati: II  TM Distance: >3 FB Neck ROM: Full    Dental  (+) Dental Advisory Given   Pulmonary Current Smoker,    breath sounds clear to auscultation       Cardiovascular negative cardio ROS   Rhythm:Regular Rate:Normal     Neuro/Psych Anxiety Depression Bipolar Disorder negative neurological ROS     GI/Hepatic GERD  ,(+)     substance abuse  cocaine use, marijuana use and methamphetamine use, Hepatitis -, C  Endo/Other  negative endocrine ROS  Renal/GU negative Renal ROS     Musculoskeletal   Abdominal   Peds  Hematology negative hematology ROS (+)   Anesthesia Other Findings   Reproductive/Obstetrics                            Lab Results  Component Value Date   WBC 6.9 08/03/2016   HGB 12.7 08/03/2016   HCT 39.2 08/03/2016   MCV 95.4 08/03/2016   PLT 370 08/03/2016   Lab Results  Component Value Date   CREATININE 0.63 08/03/2016   BUN 8 08/03/2016   NA 139 08/03/2016   K 3.8 08/03/2016   CL 105 08/03/2016   CO2 26 08/03/2016    Anesthesia Physical Anesthesia Plan  ASA: II and emergent  Anesthesia Plan: General   Post-op Pain Management:    Induction: Intravenous and Rapid sequence  PONV Risk Score and Plan: 2 and Ondansetron and Dexamethasone  Airway Management Planned: Oral ETT  Additional Equipment:   Intra-op Plan:   Post-operative Plan: Extubation in OR  Informed Consent: I have reviewed the patients History and Physical, chart, labs and discussed the procedure including the risks, benefits and alternatives for the proposed anesthesia with the patient or authorized representative who has indicated his/her understanding and acceptance.   Dental advisory  given  Plan Discussed with: CRNA  Anesthesia Plan Comments:         Anesthesia Quick Evaluation

## 2016-08-03 NOTE — Progress Notes (Signed)
Pharmacy Antibiotic Note  [name] is a 36 y.o. female admitted on 08/02/2016 with cellulitis/wound infection of L hand.  Pharmacy has been consulted for vancomycin dosing; Levaquin per MD.   Plan:  Vancomycin 1000 mg IV q12 hr; goal trough 10-15 mcg/mL  Measure vancomycin trough levels at steady state as indicated  Levaquin dosing appropriate  PCN allergy is only n/v - would not anticipate any issues with IV PCNs or cephalosporins  Also, Hx drug-seeking behavior noted but no specific IVDU history: if wound nonpurulent could likely narrow abx significantly    Height: 5\' 3"  (160 cm) Weight: 160 lb 11.5 oz (72.9 kg) IBW/kg (Calculated) : 52.4  Temp (24hrs), Avg:98.4 F (36.9 C), Min:98 F (36.7 C), Max:98.7 F (37.1 C)   Recent Labs Lab 08/02/16 2332 08/02/16 2347  WBC 7.9  --   CREATININE 0.76  --   LATICACIDVEN  --  0.68    Estimated Creatinine Clearance: 92.1 mL/min (by C-G formula based on SCr of 0.76 mg/dL).    Allergies  Allergen Reactions  . Motrin [Ibuprofen]     Hives emesis  . Hydrocodone Hives  . Penicillins Nausea And Vomiting  . Ultram [Tramadol] Nausea And Vomiting  . Aspirin Nausea And Vomiting and Rash     Thank you for allowing pharmacy to be a part of this patient's care.  Bernadene Personrew Maleigh Bagot, PharmD, BCPS Pager: [phone] 08/03/2016, 4:45 AM

## 2016-08-03 NOTE — Anesthesia Procedure Notes (Signed)
Procedure Name: Intubation Performed by: Gean Maidens Pre-anesthesia Checklist: Patient identified, Emergency Drugs available, Suction available, Patient being monitored and Timeout performed Patient Re-evaluated:Patient Re-evaluated prior to induction Oxygen Delivery Method: Circle system utilized Preoxygenation: Pre-oxygenation with 100% oxygen (rsi) Induction Type: IV induction Laryngoscope Size: Mac and 3 Grade View: Grade I Tube type: Oral Tube size: 7.0 mm Number of attempts: 1 Airway Equipment and Method: Stylet Placement Confirmation: ETT inserted through vocal cords under direct vision,  positive ETCO2,  CO2 detector and breath sounds checked- equal and bilateral Secured at: 22 cm Tube secured with: Tape Dental Injury: Teeth and Oropharynx as per pre-operative assessment

## 2016-08-03 NOTE — ED Notes (Signed)
Pt went to restroom to give a urine sample, "forgot" to get one when she went

## 2016-08-03 NOTE — H&P (Signed)
[name] is an 36 y.o. female.   Chief Complaint:  Left hand infection HPI: Patient presents for left hand infection. She had this lanced by her and days ago but unfortunately she has exposed deep structures and a very poor looking wound. I discussed her these issues at length.  At present juncture given the infection and the poor response I would recommend irrigation debridement.  The patient's chart is been reviewed. She certainly presents with a sedative challenges not only in terms of the infection but also in terms of her multiple medical issues.  We are try to give her a better hand by eliminating her infection to the best of our abilities  Past Medical History:  Diagnosis Date  . Anxiety    takes Xanax daily as needed  . Chronic back pain    herniated disc  . Chronic pelvic pain in female   . Depression    takes Lexapro daily  . Drug-seeking behavior   . Headache    constant  . History of bronchitis    yrs ago  . History of kidney stones   . Insomnia    takes Elavil nightly  . Nocturia   . Seasonal allergies    Claritin and FLonase daily as needed    Past Surgical History:  Procedure Laterality Date  . CHOLECYSTECTOMY    . THORACIC DISCECTOMY Right 03/28/2014   Procedure: RIGHT THORACIC ELEVEN-TWELVE MICRODICSECTOMY;  Surgeon: Newman Pies, MD;  Location: [facility];  Service: Neurosurgery;  Laterality: Right;  right  . TUBAL LIGATION      Family History  Problem Relation Age of Onset  . Hypertension Mother   . Hypertension Father   . Hypertension Sister   . Stroke Other   . Cancer Other   . Colon cancer Neg Hx    Social History:  reports that she has been smoking Cigarettes.  She has been smoking about 0.50 packs per day for the past 0.00 years. She has never used smokeless tobacco. She reports that she uses drugs, including Marijuana. She reports that she does not drink alcohol.  Allergies:  Allergies  Allergen Reactions  . Motrin [Ibuprofen]      Hives emesis  . Hydrocodone Hives  . Penicillins Nausea And Vomiting  . Ultram [Tramadol] Nausea And Vomiting  . Aspirin Nausea And Vomiting and Rash    No prescriptions prior to admission.    Results for orders placed or performed during the hospital encounter of 08/02/16 (from the past 48 hour(s))  CBC with Differential/Platelet     Status: None   Collection Time: 08/02/16 11:32 PM  Result Value Ref Range   WBC 7.9 4.0 - 10.5 K/uL   RBC 4.16 3.87 - 5.11 MIL/uL   Hemoglobin 13.2 12.0 - 15.0 g/dL   HCT 39.7 36.0 - 46.0 %   MCV 95.4 78.0 - 100.0 fL   MCH 31.7 26.0 - 34.0 pg   MCHC 33.2 30.0 - 36.0 g/dL   RDW 13.7 11.5 - 15.5 %   Platelets 370 150 - 400 K/uL   Neutrophils Relative % 49 %   Lymphocytes Relative 34 %   Monocytes Relative 13 %   Eosinophils Relative 4 %   Basophils Relative 0 %   Neutro Abs 3.9 1.7 - 7.7 K/uL   Lymphs Abs 2.7 0.7 - 4.0 K/uL   Monocytes Absolute 1.0 0.1 - 1.0 K/uL   Eosinophils Absolute 0.3 0.0 - 0.7 K/uL   Basophils Absolute 0.0 0.0 -  0.1 K/uL  Basic metabolic panel     Status: None   Collection Time: 08/02/16 11:32 PM  Result Value Ref Range   Sodium 137 135 - 145 mmol/L   Potassium 4.0 3.5 - 5.1 mmol/L   Chloride 101 101 - 111 mmol/L   CO2 27 22 - 32 mmol/L   Glucose, Bld 87 65 - 99 mg/dL   BUN 10 6 - 20 mg/dL   Creatinine, Ser 0.76 0.44 - 1.00 mg/dL   Calcium 8.9 8.9 - 10.3 mg/dL   GFR calc non Af Amer >60 >60 mL/min   GFR calc Af Amer >60 >60 mL/min    Comment: (NOTE) The eGFR has been calculated using the CKD EPI equation. This calculation has not been validated in all clinical situations. eGFR's persistently <60 mL/min signify possible Chronic Kidney Disease.    Anion gap 9 5 - 15  I-Stat CG4 Lactic Acid, ED     Status: None   Collection Time: 08/02/16 11:47 PM  Result Value Ref Range   Lactic Acid, Venous 0.68 0.5 - 1.9 mmol/L  HIV antibody (Routine Testing)     Status: None   Collection Time: 08/03/16  4:34 AM   Result Value Ref Range   HIV Screen 4th Generation wRfx Non Reactive Non Reactive    Comment: (NOTE) Performed At: [facility] [place], [place] [id number] Lindon Romp MD VO:[id number]   Comprehensive metabolic panel     Status: Abnormal   Collection Time: 08/03/16  4:34 AM  Result Value Ref Range   Sodium 139 135 - 145 mmol/L   Potassium 3.8 3.5 - 5.1 mmol/L   Chloride 105 101 - 111 mmol/L   CO2 26 22 - 32 mmol/L   Glucose, Bld 83 65 - 99 mg/dL   BUN 8 6 - 20 mg/dL   Creatinine, Ser 0.63 0.44 - 1.00 mg/dL   Calcium 8.6 (L) 8.9 - 10.3 mg/dL   Total Protein 6.7 6.5 - 8.1 g/dL   Albumin 3.3 (L) 3.5 - 5.0 g/dL   AST 63 (H) 15 - 41 U/L   ALT 123 (H) 14 - 54 U/L   Alkaline Phosphatase 44 38 - 126 U/L   Total Bilirubin 0.5 0.3 - 1.2 mg/dL   GFR calc non Af Amer >60 >60 mL/min   GFR calc Af Amer >60 >60 mL/min    Comment: (NOTE) The eGFR has been calculated using the CKD EPI equation. This calculation has not been validated in all clinical situations. eGFR's persistently <60 mL/min signify possible Chronic Kidney Disease.    Anion gap 8 5 - 15  CBC     Status: None   Collection Time: 08/03/16  4:34 AM  Result Value Ref Range   WBC 6.9 4.0 - 10.5 K/uL   RBC 4.11 3.87 - 5.11 MIL/uL   Hemoglobin 12.7 12.0 - 15.0 g/dL   HCT 39.2 36.0 - 46.0 %   MCV 95.4 78.0 - 100.0 fL   MCH 30.9 26.0 - 34.0 pg   MCHC 32.4 30.0 - 36.0 g/dL   RDW 13.9 11.5 - 15.5 %   Platelets 370 150 - 400 K/uL  Sedimentation rate     Status: Abnormal   Collection Time: 08/03/16  4:34 AM  Result Value Ref Range   Sed Rate 28 (H) 0 - 22 mm/hr  Urine rapid drug screen (hosp performed)     Status: Abnormal   Collection Time: 08/03/16  8:20 AM  Result Value Ref  Range   Opiates POSITIVE (A) NONE DETECTED   Cocaine POSITIVE (A) NONE DETECTED   Benzodiazepines NONE DETECTED NONE DETECTED   Amphetamines POSITIVE (A) NONE DETECTED   Tetrahydrocannabinol POSITIVE (A) NONE DETECTED    Barbiturates NONE DETECTED NONE DETECTED    Comment:        DRUG SCREEN FOR MEDICAL PURPOSES ONLY.  IF CONFIRMATION IS NEEDED FOR ANY PURPOSE, NOTIFY LAB WITHIN 5 DAYS.        LOWEST DETECTABLE LIMITS FOR URINE DRUG SCREEN Drug Class       Cutoff (ng/mL) Amphetamine      1000 Barbiturate      200 Benzodiazepine   749 Tricyclics       449 Opiates          300 Cocaine          300 THC              50    Dg Hand Complete Left  Result Date: 08/02/2016 CLINICAL DATA:  Possible spider bite with diffuse left hand swelling, dorsal wound, redness and tingling with pain. EXAM: LEFT HAND - COMPLETE 3+ VIEW COMPARISON:  None. FINDINGS: There is a focal collection of air or gas in the dorsal soft tissues of the hand, dorsal to the fourth metacarpal and space between the fourth and fifth metacarpals. This collection of gas or air measures 1.5 cm in maximum diameter. There is diffuse dorsal soft tissue swelling. The bones have normal appearances. IMPRESSION: Diffuse dorsal soft tissue swelling with a probable focal open wound containing air. Otherwise, no soft tissue gas is seen and no bony abnormality is demonstrated. Electronically Signed   By: Claudie Revering M.D.   On: 08/02/2016 23:48    Review of Systems  Respiratory: Negative.   Cardiovascular: Negative.   Gastrointestinal: Negative.   Genitourinary: Negative.     Blood pressure 114/76, pulse 67, temperature 98 F (36.7 C), temperature source Oral, resp. rate 18, height _0  (1.6 m), weight 72.9 kg (160 lb 11.5 oz), last menstrual period 08/01/2016, SpO2 98 %. Physical Exam left hand infection no evidence of dystrophy compartment syndrome. The deep structures have significant devitalized tissue and necrotic areas.    The patient is alert and oriented in no acute distress. The patient complains of pain in the affected upper extremity.  The patient is noted to have a normal HEENT exam. Lung fields show equal chest expansion and no  shortness of breath. Abdomen exam is nontender without distention. Lower extremity examination does not show any fracture dislocation or blood clot symptoms. Pelvis is stable and the neck and back are stable and nontender.     Assessment/Plan We'll plan for irrigation debridement left hand number. Structures as necessary patient understanding desires to proceed  We are planning surgery for your upper extremity. The risk and benefits of surgery to include risk of bleeding, infection, anesthesia,  damage to normal structures and failure of the surgery to accomplish its intended goals of relieving symptoms and restoring function have been discussed in detail. With this in mind we plan to proceed. I have specifically discussed with the patient the pre-and postoperative regime and the dos and don'ts and risk and benefits in great detail. Risk and benefits of surgery also include risk of dystrophy(CRPS), chronic nerve pain, failure of the healing process to go onto completion and other inherent risks of surgery The relavent the pathophysiology of the disease/injury process, as well as the alternatives for treatment and postoperative course  of action has been discussed in great detail with the patient who desires to proceed.  We will do everything in our power to help you (the patient) restore function to the upper extremity. It is a pleasure to see this patient today.   Paulene Floor, MD 08/03/2016, 10:20 PM

## 2016-08-03 NOTE — ED Notes (Signed)
Pt c/o itching to IV site, offered PO benadryl.  At some point while nurse was out of room, pt got out of bed and plugged her phone into the computer without assistance or difficulty

## 2016-08-03 NOTE — Consult Note (Signed)
Reason for Consult: Hand infection Referring Physician: Elvera [name] is an 36 y.o. female.  HPI: [name] was in her usual state of health when on Thursday of last week she began to have some left hand pain. She quickly developed a large bump and redness on the back of her hand over the next couple of days. During this time she was also experiencing N/V, subjective fevers, and dizziness. On Sunday her friend lanced it and a lot of pus drained from the wound. The pain was too great for her and she sought care in the ED on Monday and was admitted. She denies prior e/o similar. Denies trauma to hand, thinks it might have been a spider bite but didn't see one. She is LHD.  Past Medical History:  Diagnosis Date  . Anxiety    takes Xanax daily as needed  . Chronic back pain    herniated disc  . Chronic pelvic pain in female   . Depression    takes Lexapro daily  . Drug-seeking behavior   . Headache    constant  . History of bronchitis    yrs ago  . History of kidney stones   . Insomnia    takes Elavil nightly  . Nocturia   . Seasonal allergies    Claritin and FLonase daily as needed    Past Surgical History:  Procedure Laterality Date  . CHOLECYSTECTOMY    . THORACIC DISCECTOMY Right 03/28/2014   Procedure: RIGHT THORACIC ELEVEN-TWELVE MICRODICSECTOMY;  Surgeon: Newman Pies, MD;  Location: [facility];  Service: Neurosurgery;  Laterality: Right;  right  . TUBAL LIGATION      Family History  Problem Relation Age of Onset  . Hypertension Mother   . Hypertension Father   . Hypertension Sister   . Stroke Other   . Cancer Other   . Colon cancer Neg Hx     Social History:  reports that she has been smoking Cigarettes.  She has been smoking about 0.50 packs per day for the past 0.00 years. She has never used smokeless tobacco. She reports that she uses drugs, including Marijuana. She reports that she does not drink alcohol.  Allergies:  Allergies  Allergen  Reactions  . Motrin [Ibuprofen]     Hives emesis  . Hydrocodone Hives  . Penicillins Nausea And Vomiting  . Ultram [Tramadol] Nausea And Vomiting  . Aspirin Nausea And Vomiting and Rash    Medications: I have reviewed the patient's current medications.  Results for orders placed or performed during the hospital encounter of 08/02/16 (from the past 48 hour(s))  CBC with Differential/Platelet     Status: None   Collection Time: 08/02/16 11:32 PM  Result Value Ref Range   WBC 7.9 4.0 - 10.5 K/uL   RBC 4.16 3.87 - 5.11 MIL/uL   Hemoglobin 13.2 12.0 - 15.0 g/dL   HCT 39.7 36.0 - 46.0 %   MCV 95.4 78.0 - 100.0 fL   MCH 31.7 26.0 - 34.0 pg   MCHC 33.2 30.0 - 36.0 g/dL   RDW 13.7 11.5 - 15.5 %   Platelets 370 150 - 400 K/uL   Neutrophils Relative % 49 %   Lymphocytes Relative 34 %   Monocytes Relative 13 %   Eosinophils Relative 4 %   Basophils Relative 0 %   Neutro Abs 3.9 1.7 - 7.7 K/uL   Lymphs Abs 2.7 0.7 - 4.0 K/uL   Monocytes Absolute 1.0 0.1 - 1.0  K/uL   Eosinophils Absolute 0.3 0.0 - 0.7 K/uL   Basophils Absolute 0.0 0.0 - 0.1 K/uL  Basic metabolic panel     Status: None   Collection Time: 08/02/16 11:32 PM  Result Value Ref Range   Sodium 137 135 - 145 mmol/L   Potassium 4.0 3.5 - 5.1 mmol/L   Chloride 101 101 - 111 mmol/L   CO2 27 22 - 32 mmol/L   Glucose, Bld 87 65 - 99 mg/dL   BUN 10 6 - 20 mg/dL   Creatinine, Ser 0.76 0.44 - 1.00 mg/dL   Calcium 8.9 8.9 - 10.3 mg/dL   GFR calc non Af Amer >60 >60 mL/min   GFR calc Af Amer >60 >60 mL/min    Comment: (NOTE) The eGFR has been calculated using the CKD EPI equation. This calculation has not been validated in all clinical situations. eGFR's persistently <60 mL/min signify possible Chronic Kidney Disease.    Anion gap 9 5 - 15  I-Stat CG4 Lactic Acid, ED     Status: None   Collection Time: 08/02/16 11:47 PM  Result Value Ref Range   Lactic Acid, Venous 0.68 0.5 - 1.9 mmol/L  Comprehensive metabolic panel      Status: Abnormal   Collection Time: 08/03/16  4:34 AM  Result Value Ref Range   Sodium 139 135 - 145 mmol/L   Potassium 3.8 3.5 - 5.1 mmol/L   Chloride 105 101 - 111 mmol/L   CO2 26 22 - 32 mmol/L   Glucose, Bld 83 65 - 99 mg/dL   BUN 8 6 - 20 mg/dL   Creatinine, Ser 0.63 0.44 - 1.00 mg/dL   Calcium 8.6 (L) 8.9 - 10.3 mg/dL   Total Protein 6.7 6.5 - 8.1 g/dL   Albumin 3.3 (L) 3.5 - 5.0 g/dL   AST 63 (H) 15 - 41 U/L   ALT 123 (H) 14 - 54 U/L   Alkaline Phosphatase 44 38 - 126 U/L   Total Bilirubin 0.5 0.3 - 1.2 mg/dL   GFR calc non Af Amer >60 >60 mL/min   GFR calc Af Amer >60 >60 mL/min    Comment: (NOTE) The eGFR has been calculated using the CKD EPI equation. This calculation has not been validated in all clinical situations. eGFR's persistently <60 mL/min signify possible Chronic Kidney Disease.    Anion gap 8 5 - 15  CBC     Status: None   Collection Time: 08/03/16  4:34 AM  Result Value Ref Range   WBC 6.9 4.0 - 10.5 K/uL   RBC 4.11 3.87 - 5.11 MIL/uL   Hemoglobin 12.7 12.0 - 15.0 g/dL   HCT 39.2 36.0 - 46.0 %   MCV 95.4 78.0 - 100.0 fL   MCH 30.9 26.0 - 34.0 pg   MCHC 32.4 30.0 - 36.0 g/dL   RDW 13.9 11.5 - 15.5 %   Platelets 370 150 - 400 K/uL  Sedimentation rate     Status: Abnormal   Collection Time: 08/03/16  4:34 AM  Result Value Ref Range   Sed Rate 28 (H) 0 - 22 mm/hr  Urine rapid drug screen (hosp performed)     Status: Abnormal   Collection Time: 08/03/16  8:20 AM  Result Value Ref Range   Opiates POSITIVE (A) NONE DETECTED   Cocaine POSITIVE (A) NONE DETECTED   Benzodiazepines NONE DETECTED NONE DETECTED   Amphetamines POSITIVE (A) NONE DETECTED   Tetrahydrocannabinol POSITIVE (A) NONE DETECTED   Barbiturates NONE  DETECTED NONE DETECTED    Comment:        DRUG SCREEN FOR MEDICAL PURPOSES ONLY.  IF CONFIRMATION IS NEEDED FOR ANY PURPOSE, NOTIFY LAB WITHIN 5 DAYS.        LOWEST DETECTABLE LIMITS FOR URINE DRUG SCREEN Drug Class       Cutoff  (ng/mL) Amphetamine      1000 Barbiturate      200 Benzodiazepine   867 Tricyclics       672 Opiates          300 Cocaine          300 THC              50     Dg Hand Complete Left  Result Date: 08/02/2016 CLINICAL DATA:  Possible spider bite with diffuse left hand swelling, dorsal wound, redness and tingling with pain. EXAM: LEFT HAND - COMPLETE 3+ VIEW COMPARISON:  None. FINDINGS: There is a focal collection of air or gas in the dorsal soft tissues of the hand, dorsal to the fourth metacarpal and space between the fourth and fifth metacarpals. This collection of gas or air measures 1.5 cm in maximum diameter. There is diffuse dorsal soft tissue swelling. The bones have normal appearances. IMPRESSION: Diffuse dorsal soft tissue swelling with a probable focal open wound containing air. Otherwise, no soft tissue gas is seen and no bony abnormality is demonstrated. Electronically Signed   By: Claudie Revering M.D.   On: 08/02/2016 23:48    Review of Systems  Constitutional: Negative for weight loss.  HENT: Negative for ear discharge, ear pain, hearing loss and tinnitus.   Eyes: Negative for blurred vision, double vision, photophobia and pain.  Respiratory: Negative for cough, sputum production and shortness of breath.   Cardiovascular: Negative for chest pain.  Gastrointestinal: Negative for abdominal pain, nausea and vomiting.  Genitourinary: Negative for dysuria, flank pain, frequency and urgency.  Musculoskeletal: Positive for joint pain (Left hand). Negative for back pain, falls, myalgias and neck pain.  Neurological: Positive for sensory change. Negative for dizziness, tingling, focal weakness, loss of consciousness and headaches.  Endo/Heme/Allergies: Does not bruise/bleed easily.  Psychiatric/Behavioral: Negative for depression, memory loss and substance abuse. The patient is not nervous/anxious.    Blood pressure 110/70, pulse 68, temperature 98 F (36.7 C), temperature source Oral,  resp. rate 16, height '5\' 3"'  (1.6 m), weight 72.9 kg (160 lb 11.5 oz), last menstrual period 08/01/2016, SpO2 100 %. Physical Exam  Constitutional: She appears well-developed and well-nourished. No distress.  HENT:  Head: Normocephalic and atraumatic.  Eyes: Conjunctivae are normal. Right eye exhibits no discharge. Left eye exhibits no discharge. No scleral icterus.  Cardiovascular: Normal rate and regular rhythm.   Respiratory: Effort normal. No respiratory distress.  Musculoskeletal:  Left shoulder, elbow, wrist, digits- 0.5cm lac on dorsum of hand, surrounding erythema, moderate TTP, no fluctuance, no instability, no blocks to motion  Sens  Ax/U intact R/M paresthetic  Mot   Ax/ R/ PIN/ M/ AIN/ U intact but limited 2/2 pain  Rad 2+   Neurological: She is alert.  Skin: Skin is warm and dry. She is not diaphoretic.  Psychiatric: She has a normal mood and affect. Her behavior is normal.    Assessment/Plan: Left hand infection -- Agree with MR to r/o further abscess but I don't appreciate any fluctuance. Please keep NPO until results of MR are known. If no further abscess I think she can be safely discharged on broad spectrum  oral abx and local wound care. She may f/u with her PCP or, if needed, Dr. Amedeo Plenty should things get worse.     Lisette Abu, PA-C Orthopedic Surgery [phone] 08/03/2016, 10:16 AM

## 2016-08-03 NOTE — Consult Note (Addendum)
WOC Nurse wound consult note Reason for Consult: Lesion to left hand.  Cellulitis.  MD has consulted the hand specialist, per bedside RN.  Will defer to their services and wound care recommendations.  Please re-consult if needed.   Wound type:infectious Pressure Injury POA: NA Measurement: 2 cm x 1 cm x 0.5 cm  Wound bed:50% devitalized tissue Induration- awaiting consult for potential I & D.  Drainage (amount, consistency, odor) Purulent green effluent Periwound:erythema and edema Dressing procedure/placement/frequency: Dry dressing in place.  Awaiting specialist consult.  Will not follow at this time.  Please re-consult if needed.  Maple HudsonKaren Korissa Horsford RN BSN CWON Pager [phone]

## 2016-08-03 NOTE — ED Notes (Signed)
Pt. Became tearful suddenly states her L hand hurts and she feels like it is going to pop open.  RN reported to EDP.  EDP to Pt. Room to reassess the Pt.

## 2016-08-04 ENCOUNTER — Encounter (HOSPITAL_COMMUNITY): Payer: Self-pay | Admitting: Orthopedic Surgery

## 2016-08-04 DIAGNOSIS — L03114 Cellulitis of left upper limb: Secondary | ICD-10-CM

## 2016-08-04 DIAGNOSIS — L02512 Cutaneous abscess of left hand: Secondary | ICD-10-CM

## 2016-08-04 LAB — BASIC METABOLIC PANEL
Anion gap: 7 (ref 5–15)
BUN: 9 mg/dL (ref 6–20)
CALCIUM: 9 mg/dL (ref 8.9–10.3)
CO2: 26 mmol/L (ref 22–32)
CREATININE: 0.63 mg/dL (ref 0.44–1.00)
Chloride: 104 mmol/L (ref 101–111)
Glucose, Bld: 151 mg/dL — ABNORMAL HIGH (ref 65–99)
Potassium: 4.5 mmol/L (ref 3.5–5.1)
SODIUM: 137 mmol/L (ref 135–145)

## 2016-08-04 LAB — CBC
HCT: 38.7 % (ref 36.0–46.0)
HEMOGLOBIN: 12.5 g/dL (ref 12.0–15.0)
MCH: 30.9 pg (ref 26.0–34.0)
MCHC: 32.3 g/dL (ref 30.0–36.0)
MCV: 95.8 fL (ref 78.0–100.0)
PLATELETS: 353 10*3/uL (ref 150–400)
RBC: 4.04 MIL/uL (ref 3.87–5.11)
RDW: 13.7 % (ref 11.5–15.5)
WBC: 8.3 10*3/uL (ref 4.0–10.5)

## 2016-08-04 MED ORDER — OXYCODONE HCL 5 MG PO TABS
5.0000 mg | ORAL_TABLET | ORAL | Status: AC | PRN
  Administered 2016-08-04 (×2): 10 mg via ORAL
  Filled 2016-08-04 (×2): qty 2

## 2016-08-04 MED ORDER — MORPHINE SULFATE (PF) 4 MG/ML IV SOLN
4.0000 mg | Freq: Once | INTRAVENOUS | Status: AC
  Administered 2016-08-04: 4 mg via INTRAVENOUS
  Filled 2016-08-04: qty 1

## 2016-08-04 NOTE — Op Note (Signed)
[name]:  Valente, Treniece              ACCOUNT NO.:  [id number]  MEDICAL RECORD NO.:  [id number]  LOCATION:  [facility]                         [facility]  PHYSICIAN:  Dionne AnoWilliam M. Azayla Polo, M.D.DATE OF BIRTH:  [birth year]  DATE OF PROCEDURE: DATE OF DISCHARGE:                              OPERATIVE REPORT   PREOPERATIVE DIAGNOSIS:  Left hand abscess with purulent tenosynovitis.  POSTOPERATIVE DIAGNOSIS:  Left hand abscess with purulent tenosynovitis.  PROCEDURES: 1. Irrigation and debridement of deep abscess, left hand. 2. Radical tenosynovectomy of the extensor digitorum communis,     extensor indicis proprius, and extensor digiti minimi tendons, left     hand and wrist. 3. Loose closure over drain, left hand.  SURGEON:  Dionne AnoWilliam M. Amanda PeaGramig, M.D.  ASSISTANT:  None.  COMPLICATIONS:  None.  ANESTHESIA:  General.  TOURNIQUET TIME:  Less than 30 minutes.  INDICATIONS:  A 36 year old female with history of multiple medical challenges including drug use.  The patient understands and accepts the risks and benefits of surgery.  She has a wound on the dorsal aspect of her hand.  I was asked to review this.  I feel this is likely going to have a very great and difficult wound healing problems given the necrosis and necrotic areas.  She has a full-thickness defect down to the tendons.  DESCRIPTION OF PROCEDURE:  The patient was seen by myself and Anesthesia, consented to the operative measures and underwent Hibiclens pre-scrub followed by Betadine scrub and paint performed by myself.  She was prepped and draped without difficulty.  Sterile field was secured. Time-out observed.  Following this, I made incision and performed I and D of skin, subcutaneous tissue, tendon and muscle tissue.  Aerobic and anaerobic cultures were taken.  This was an I and D of a deep abscess with curette, knife, blade, and scissor tip.  Following this, I performed a radical tenosynovectomy of the  extensor digitorum communis, extensor indicis proprius, and extensor digiti minimi, removing an inflammatory peel about the affected areas.  I then irrigated with greater than 3 L of saline.  Wound conditions were stable.  There were no complicating features.  I closed the wound loosely over Penrose drain.  I will plan for dressing change in 48 hours.  She had excellent refill, soft compartments, no complicating features.  We will monitor closely in the postoperative period.  Continue IV antibiotics and move forward accordingly.  It is pleasure participating in her care.  I will not plan for another washout unless her condition drastically changes.     Dionne AnoWilliam M. Amanda PeaGramig, M.D.     [facility]/MEDQ  D:  08/03/2016  T:  08/04/2016  Job:  [id number]

## 2016-08-04 NOTE — Progress Notes (Signed)
Pharmacy Antibiotic Note  [name] is a 36 y.o. female admitted on 08/02/2016 with cellulitis/wound infection of L hand.  Pharmacy has been consulted for vancomycin dosing; Levaquin per MD.   Plan:  Continue Vancomycin 1000 mg IV q12 hr; goal trough 10-15 mcg/mL  Continue Levaquin 500mg  IV daily  PCN allergy is only n/v - would not anticipate any issues with IV PCNs or cephalosporins  Check vanc trough prior to tomorrow AM's dose which would be prior to 6th dose   Height: 5\' 3"  (160 cm) Weight: 167 lb 15.9 oz (76.2 kg) IBW/kg (Calculated) : 52.4  Temp (24hrs), Avg:98 F (36.7 C), Min:97.7 F (36.5 C), Max:98.5 F (36.9 C)   Recent Labs Lab 08/02/16 2332 08/02/16 2347 08/03/16 0434 08/04/16 0514  WBC 7.9  --  6.9 8.3  CREATININE 0.76  --  0.63 0.63  LATICACIDVEN  --  0.68  --   --     Estimated Creatinine Clearance: 94.1 mL/min (by C-G formula based on SCr of 0.63 mg/dL).    Allergies  Allergen Reactions  . Motrin [Ibuprofen]     Hives emesis  . Hydrocodone Hives  . Penicillins Nausea And Vomiting  . Ultram [Tramadol] Nausea And Vomiting  . Aspirin Nausea And Vomiting and Rash     Thank you for allowing pharmacy to be a part of this patient's care.   Hessie KnowsJustin M Jaelle Campanile, PharmD, BCPS Pager [phone] 08/04/2016 10:48 AM

## 2016-08-04 NOTE — Progress Notes (Signed)
Chaplain following due to consult for adv. Directives.     Provided education around advance directives, provided paperwork.  [name] wishes to look over paperwork and follow up with chaplain to complete.  Understands how to contact chaplain when she is ready to notarize adv. Dir.    Will follow in order to complete adv. Dir.    [facility] Chaplain Burnis KingfisherMatthew Akaysha Cobern, MDiv

## 2016-08-04 NOTE — Progress Notes (Signed)
Patient ID: [name], female   DOB: 11/25/1980, 36 y.o.   MRN: [id number]  PROGRESS NOTE    [name]  NWG:[id number] DOB: 12/28/1980 DOA: 08/02/2016 PCP: Merlyn AlbertLuking, William S, MD   Brief Narrative: 36 year old female with history of chronic pain, anxiety presented with wound on dorsum of left hand that was lanced by her friend as an outpatient. Patient was admitted and started on IV antibiotics. Patient had I&D done by orthopedics on 08/03/2016.  Assessment & Plan:   Active Problems:   Skin ulcer (HCC)   Cellulitis   Cellulitis of hand, left   Left hand cellulitis with ulcer/abscess - Status post I&D by orthopedics on 08/03/2016. Follow up cultures. Continue IV vancomycin and Levaquin - Repeat a.m. labs  Anxiety - Continue Xanax when necessary and duloxetine   DVT prophylaxis: Lovenox Code Status:  Full Family Communication: None at bedside Disposition Plan: Home in 2-3 days  Consultants: Orthopedics  Procedures: I&D on 08/03/2016  Antimicrobials: Clindamycin and Levaquin from 08/02/2016   Subjective: Patient seen and examined at bedside. She denies any overnight fever, nausea or vomiting. Complains of some pain in the left hand.  Objective: Vitals:   08/04/16 0015 08/04/16 0021 08/04/16 0035 08/04/16 0523  BP:  119/75 123/71 113/74  Pulse: 78 68 78 (!) 50  Resp: 11 11  16   Temp: 97.9 F (36.6 C)   97.7 F (36.5 C)  TempSrc:    Oral  SpO2: 99% 100% 96% 95%  Weight:    76.2 kg (167 lb 15.9 oz)  Height:        Intake/Output Summary (Last 24 hours) at 08/04/16 1003 Last data filed at 08/04/16 0900  Gross per 24 hour  Intake             2110 ml  Output             1060 ml  Net             1050 ml   Filed Weights   08/02/16 2106 08/03/16 0341 08/04/16 0523  Weight: 68 kg (150 lb) 72.9 kg (160 lb 11.5 oz) 76.2 kg (167 lb 15.9 oz)    Examination:  General exam: Appears calm and comfortable  Respiratory system: Bilateral decreased breath sound at  bases Cardiovascular system: S1 & S2 heard, Rate controlled  Gastrointestinal system: Abdomen is nondistended, soft and nontender. Normal bowel sounds heard. Extremities: No cyanosis, clubbing; left hand and wrist has dressing   Data Reviewed: I have personally reviewed following labs and imaging studies  CBC:  Recent Labs Lab 08/02/16 2332 08/03/16 0434 08/04/16 0514  WBC 7.9 6.9 8.3  NEUTROABS 3.9  --   --   HGB 13.2 12.7 12.5  HCT 39.7 39.2 38.7  MCV 95.4 95.4 95.8  PLT 370 370 353   Basic Metabolic Panel:  Recent Labs Lab 08/02/16 2332 08/03/16 0434 08/04/16 0514  NA 137 139 137  K 4.0 3.8 4.5  CL 101 105 104  CO2 27 26 26   GLUCOSE 87 83 151*  BUN 10 8 9   CREATININE 0.76 0.63 0.63  CALCIUM 8.9 8.6* 9.0   GFR: Estimated Creatinine Clearance: 94.1 mL/min (by C-G formula based on SCr of 0.63 mg/dL). Liver Function Tests:  Recent Labs Lab 08/03/16 0434  AST 63*  ALT 123*  ALKPHOS 44  BILITOT 0.5  PROT 6.7  ALBUMIN 3.3*   No results for input(s): LIPASE, AMYLASE in the last 168 hours. No results for input(s): AMMONIA  in the last 168 hours. Coagulation Profile: No results for input(s): INR, PROTIME in the last 168 hours. Cardiac Enzymes: No results for input(s): CKTOTAL, CKMB, CKMBINDEX, TROPONINI in the last 168 hours. BNP (last 3 results) No results for input(s): PROBNP in the last 8760 hours. HbA1C: No results for input(s): HGBA1C in the last 72 hours. CBG: No results for input(s): GLUCAP in the last 168 hours. Lipid Profile: No results for input(s): CHOL, HDL, LDLCALC, TRIG, CHOLHDL, LDLDIRECT in the last 72 hours. Thyroid Function Tests: No results for input(s): TSH, T4TOTAL, FREET4, T3FREE, THYROIDAB in the last 72 hours. Anemia Panel: No results for input(s): VITAMINB12, FOLATE, FERRITIN, TIBC, IRON, RETICCTPCT in the last 72 hours. Sepsis Labs:  Recent Labs Lab 08/02/16 2347  LATICACIDVEN 0.68    No results found for this or any  previous visit (from the past 240 hour(s)).       Radiology Studies: Dg Hand Complete Left  Result Date: 08/02/2016 CLINICAL DATA:  Possible spider bite with diffuse left hand swelling, dorsal wound, redness and tingling with pain. EXAM: LEFT HAND - COMPLETE 3+ VIEW COMPARISON:  None. FINDINGS: There is a focal collection of air or gas in the dorsal soft tissues of the hand, dorsal to the fourth metacarpal and space between the fourth and fifth metacarpals. This collection of gas or air measures 1.5 cm in maximum diameter. There is diffuse dorsal soft tissue swelling. The bones have normal appearances. IMPRESSION: Diffuse dorsal soft tissue swelling with a probable focal open wound containing air. Otherwise, no soft tissue gas is seen and no bony abnormality is demonstrated. Electronically Signed   By: Beckie SaltsSteven  Reid M.D.   On: 08/02/2016 23:48        Scheduled Meds: . DULoxetine  30 mg Oral Daily  . enoxaparin (LOVENOX) injection  40 mg Subcutaneous Q24H   Continuous Infusions: . levofloxacin (LEVAQUIN) IV Stopped (08/04/16 0852)  . vancomycin 1,000 mg (08/04/16 0952)     LOS: 1 day        Glade LloydKshitiz Calyn Rubi, MD [facility] Pager [phone]  If 7PM-7AM, please contact night-coverage [email or web address] Password [email or web address] 08/04/2016, 10:03 AM

## 2016-08-05 LAB — CBC WITH DIFFERENTIAL/PLATELET
BASOS ABS: 0.1 10*3/uL (ref 0.0–0.1)
Basophils Relative: 1 %
Eosinophils Absolute: 0.1 10*3/uL (ref 0.0–0.7)
Eosinophils Relative: 1 %
HEMATOCRIT: 36.7 % (ref 36.0–46.0)
HEMOGLOBIN: 11.8 g/dL — AB (ref 12.0–15.0)
LYMPHS ABS: 2.9 10*3/uL (ref 0.7–4.0)
LYMPHS PCT: 34 %
MCH: 30.7 pg (ref 26.0–34.0)
MCHC: 32.2 g/dL (ref 30.0–36.0)
MCV: 95.6 fL (ref 78.0–100.0)
Monocytes Absolute: 0.7 10*3/uL (ref 0.1–1.0)
Monocytes Relative: 8 %
NEUTROS ABS: 4.8 10*3/uL (ref 1.7–7.7)
Neutrophils Relative %: 56 %
Platelets: 341 10*3/uL (ref 150–400)
RBC: 3.84 MIL/uL — AB (ref 3.87–5.11)
RDW: 13.9 % (ref 11.5–15.5)
WBC: 8.6 10*3/uL (ref 4.0–10.5)

## 2016-08-05 LAB — BASIC METABOLIC PANEL
ANION GAP: 6 (ref 5–15)
BUN: 13 mg/dL (ref 6–20)
CHLORIDE: 105 mmol/L (ref 101–111)
CO2: 27 mmol/L (ref 22–32)
Calcium: 8.5 mg/dL — ABNORMAL LOW (ref 8.9–10.3)
Creatinine, Ser: 0.77 mg/dL (ref 0.44–1.00)
GFR calc Af Amer: >60 mL/min (ref >60)
GLUCOSE: 98 mg/dL (ref 65–99)
POTASSIUM: 4.2 mmol/L (ref 3.5–5.1)
Sodium: 138 mmol/L (ref 135–145)

## 2016-08-05 LAB — C-REACTIVE PROTEIN: CRP: <0.8 mg/dL (ref <1.0)

## 2016-08-05 LAB — VANCOMYCIN, TROUGH: Vancomycin Tr: 11 ug/mL — ABNORMAL LOW (ref 15–20)

## 2016-08-05 MED ORDER — LORATADINE 10 MG PO TABS
10.0000 mg | ORAL_TABLET | Freq: Every day | ORAL | Status: DC | PRN
  Administered 2016-08-05: 10 mg via ORAL
  Filled 2016-08-05: qty 1

## 2016-08-05 MED ORDER — DIPHENHYDRAMINE HCL 25 MG PO CAPS: 25.0000 mg | ORAL_CAPSULE | Freq: Two times a day (BID) | ORAL | Status: DC | PRN

## 2016-08-05 MED ORDER — OXYCODONE HCL 5 MG PO TABS
5.0000 mg | ORAL_TABLET | Freq: Four times a day (QID) | ORAL | Status: DC | PRN
Start: 2016-08-05 — End: 2016-08-06
  Administered 2016-08-05 – 2016-08-06 (×5): 10 mg via ORAL
  Filled 2016-08-05 (×5): qty 2

## 2016-08-05 NOTE — Anesthesia Postprocedure Evaluation (Signed)
Anesthesia Post Note  Patient: Callie Fieldingrystal G Tendler  Procedure(s) Performed: Procedure(s) (LRB): IRRIGATION AND DEBRIDEMENT LEFT HAND (Left)     Patient location during evaluation: PACU Anesthesia Type: General Level of consciousness: awake and alert Pain management: pain level controlled Vital Signs Assessment: post-procedure vital signs reviewed and stable Respiratory status: spontaneous breathing, nonlabored ventilation, respiratory function stable and patient connected to nasal cannula oxygen Cardiovascular status: blood pressure returned to baseline and stable Postop Assessment: no signs of nausea or vomiting Anesthetic complications: no    Last Vitals:  Vitals:   08/04/16 2208 08/05/16 0628  BP: 105/68 118/81  Pulse: 74 61  Resp: 16 16  Temp: 36.7 C 36.6 C    Last Pain:  Vitals:   08/05/16 0628  TempSrc: Oral  PainSc:                  Kennieth RadFitzgerald, Cassady Stanczak E

## 2016-08-05 NOTE — Progress Notes (Signed)
Patient ID: [name], female   DOB: 10/13/1980, 36 y.o.   MRN: [id number]  PROGRESS NCallie FieldingTE    Callie FieldingCrystal G Boxley  [id number] DOB: 07/01/1980 DOA: 08/02/2016 PCP: Merlyn AlbertLuking, William S, MD   Brief Narrative:  36 year old female with history of chronic pain, anxiety presented with wound on dorsum of left hand that was lanced by her friend as an outpatient. Patient was admitted and started on IV antibiotics. Patient had I&D done by orthopedics on 08/03/2016.  Assessment & Plan:   Active Problems:   Skin ulcer (HCC)   Cellulitis   Cellulitis of hand, left   Left hand cellulitis with ulcer/abscess - Status post I&D by orthopedics on 08/03/2016.  - OR culture is growing gram-positive cocci; follow identification and sensitivities - Continue IV vancomycin and Levaquin - Repeat a.m. Labs - Follow further recommendations from orthopedics; wound care as per orthopedics recommendations - Continue pain management  Anxiety - Continue Xanax when necessary and duloxetine  Polysubstance abuse - Social worker consult  DVT prophylaxis: Lovenox Code Status:  Full Family Communication: None at bedside Disposition Plan: Home in 1-3 days  Consultants: Orthopedics  Procedures: I&D on 08/03/2016  Antimicrobials: Vancomycin and Levaquin from 08/02/2016  Subjective: Patient seen and examined at bedside. She denies any overnight fever, nausea or vomiting. Complains of pain in the left hand  Objective: Vitals:   08/04/16 0523 08/04/16 1523 08/04/16 2208 08/05/16 0628  BP: 113/74 109/65 105/68 118/81  Pulse: (!) 50 71 74 61  Resp: 16 14 16 16   Temp: 97.7 F (36.5 C) 98.2 F (36.8 C) 98.1 F (36.7 C) 97.9 F (36.6 C)  TempSrc: Oral Oral Oral Oral  SpO2: 95% 97% 99% 100%  Weight: 76.2 kg (167 lb 15.9 oz)   76.5 kg (168 lb 10.4 oz)  Height:        Intake/Output Summary (Last 24 hours) at 08/05/16 1013 Last data filed at 08/05/16 0600  Gross per 24 hour  Intake              760  ml  Output                0 ml  Net              760 ml   Filed Weights   08/03/16 0341 08/04/16 0523 08/05/16 0628  Weight: 72.9 kg (160 lb 11.5 oz) 76.2 kg (167 lb 15.9 oz) 76.5 kg (168 lb 10.4 oz)    Examination:  General exam: Appears calm and comfortable  Respiratory system: Bilateral decreased breath sound at bases Cardiovascular system: S1 & S2 heard, RRR.   Gastrointestinal system: Abdomen is nondistended, soft and nontender. Normal bowel sounds heard. Extremities: No cyanosis, clubbing; left hand and wrist has dressing  Data Reviewed: I have personally reviewed following labs and imaging studies  CBC:  Recent Labs Lab 08/02/16 2332 08/03/16 0434 08/04/16 0514 08/05/16 0458  WBC 7.9 6.9 8.3 8.6  NEUTROABS 3.9  --   --  4.8  HGB 13.2 12.7 12.5 11.8*  HCT 39.7 39.2 38.7 36.7  MCV 95.4 95.4 95.8 95.6  PLT 370 370 353 341   Basic Metabolic Panel:  Recent Labs Lab 08/02/16 2332 08/03/16 0434 08/04/16 0514 08/05/16 0458  NA 137 139 137 138  K 4.0 3.8 4.5 4.2  CL 101 105 104 105  CO2 27 26 26 27   GLUCOSE 87 83 151* 98  BUN 10 8 9 13   CREATININE 0.76 0.63 0.63 0.77  CALCIUM  8.9 8.6* 9.0 8.5*   GFR: Estimated Creatinine Clearance: 94.2 mL/min (by C-G formula based on SCr of 0.77 mg/dL). Liver Function Tests:  Recent Labs Lab 08/03/16 0434  AST 63*  ALT 123*  ALKPHOS 44  BILITOT 0.5  PROT 6.7  ALBUMIN 3.3*   No results for input(s): LIPASE, AMYLASE in the last 168 hours. No results for input(s): AMMONIA in the last 168 hours. Coagulation Profile: No results for input(s): INR, PROTIME in the last 168 hours. Cardiac Enzymes: No results for input(s): CKTOTAL, CKMB, CKMBINDEX, TROPONINI in the last 168 hours. BNP (last 3 results) No results for input(s): PROBNP in the last 8760 hours. HbA1C: No results for input(s): HGBA1C in the last 72 hours. CBG: No results for input(s): GLUCAP in the last 168 hours. Lipid Profile: No results for input(s):  CHOL, HDL, LDLCALC, TRIG, CHOLHDL, LDLDIRECT in the last 72 hours. Thyroid Function Tests: No results for input(s): TSH, T4TOTAL, FREET4, T3FREE, THYROIDAB in the last 72 hours. Anemia Panel: No results for input(s): VITAMINB12, FOLATE, FERRITIN, TIBC, IRON, RETICCTPCT in the last 72 hours. Sepsis Labs:  Recent Labs Lab 08/02/16 2347  LATICACIDVEN 0.68    Recent Results (from the past 240 hour(s))  Wound or Superficial Culture     Status: None (Preliminary result)   Collection Time: 08/02/16 11:55 PM  Result Value Ref Range Status   Specimen Description HAND LEFT  Final   Special Requests NONE  Final   Gram Stain   Final    RARE WBC PRESENT,BOTH PMN AND MONONUCLEAR RARE GRAM POSITIVE COCCI    Culture   Final    NO GROWTH 1 DAY Performed at [facility], [address] [place], [place] [address]    Report Status PENDING  Incomplete  Blood culture (routine x 2)     Status: None (Preliminary result)   Collection Time: 08/03/16 12:03 AM  Result Value Ref Range Status   Specimen Description BLOOD RIGHT ANTECUBITAL  Final   Special Requests   Final    BOTTLES DRAWN AEROBIC AND ANAEROBIC Blood Culture adequate volume   Culture   Final    NO GROWTH 1 DAY Performed at [facility], [address] [place], [place] [address]    Report Status PENDING  Incomplete  Aerobic/Anaerobic Culture (surgical/deep wound)     Status: None (Preliminary result)   Collection Time: 08/03/16 11:25 PM  Result Value Ref Range Status   Specimen Description TISSUE HAND  Final   Special Requests NONE  Final   Gram Stain   Final    RARE WBC PRESENT, PREDOMINANTLY PMN NO ORGANISMS SEEN Performed at [facility], [address] [place], [place] [address]    Culture PENDING  Incomplete   Report Status PENDING  Incomplete         Radiology Studies: No results found.      Scheduled Meds: . DULoxetine  30 mg Oral Daily  . enoxaparin (LOVENOX) injection  40 mg  Subcutaneous Q24H   Continuous Infusions: . levofloxacin (LEVAQUIN) IV 500 mg (08/05/16 0936)  . vancomycin Stopped (08/04/16 2300)     LOS: 2 days        Glade LloydKshitiz Preslynn Bier, MD [facility] Pager [phone]  If 7PM-7AM, please contact night-coverage [email or web address] Password [email or web address] 08/05/2016, 10:13 AM

## 2016-08-05 NOTE — Clinical Social Work Note (Signed)
Clinical Social Work Assessment  Patient Details  Name: [name] MRN: [id number] Date of Birth: 01/21/1980  Date of referral:  08/05/16               Reason for consult:  Substance Use/ETOH Abuse                Permission sought to share information with:  Family Supports Permission granted to share information::  Yes, Verbal Permission Granted  Name::        Agency::     Relationship::     Contact Information:     Housing/Transportation Living arrangements for the past 2 months:  Single Family Home  Source of Information:  Patient Patient Interpreter Needed:  None Criminal Activity/Legal Involvement Pertinent to Current Situation/Hospitalization:  No - Comment as needed Significant Relationships: Spouse Lives with:  Minor Children, Spouse Do you feel safe going back to the place where you live?  Yes Need for family participation in patient care:  No (Coment)  Care giving concerns: SA Substance/   Social Worker assessment / plan:  CSW met with patient at bedside, explain role and reason for consult. Patient alert, oriented and agreeable to talk.  She reports, " I don't do it regularly and I understand the impact it can have on my health." Patient reports this was the "first time in a long time." Patient reports she has not received treatment in the past. CSW educated about residential and outpatient resources. Patient reports she look into resuces if needed.  Plan: Provided follow up SA resources.   Employment status:  Full-Time Insurance information:  Medicaid In State PT Recommendations:  Not assessed at this time Information / Referral to community resources:  Outpatient Substance Abuse Treatment Options  Patient/Family's Response to care:  Agreeable and responding well to care. Appreciative of CSW services.   Patient/Family's Understanding of and Emotional Response to Diagnosis, Current Treatment, and Prognosis:  "it was the first time in years." Patient denies she  uses drugs regularly. Patient understands the impact illicit drugs can have on her health. No family at bedside.   Emotional Assessment Appearance:  Developmentally appropriate Attitude/Demeanor/Rapport:    Affect (typically observed):  Accepting, Calm, Pleasant Orientation:  Oriented to Self, Oriented to Place, Oriented to  Time, Oriented to Situation Alcohol / Substance use:  Illicit Drugs Psych involvement (Current and /or in the community):  No  Discharge Needs  Concerns to be addressed:  Discharge Planning Concerns Readmission within the last 30 days:  No Current discharge risk:  None Barriers to Discharge:  Continued Medical Work up    A , LCSW 08/05/2016, 1:13 PM  

## 2016-08-05 NOTE — Progress Notes (Addendum)
Subjective: 2 Days Post-Op Procedure(s) (LRB): IRRIGATION AND DEBRIDEMENT LEFT HAND (Left) Patient reports pain as increased today. She states she has not been given any pain medication. She denies fever, chills, shortness of breath. She is very eager for discharge.  Objective: Vital signs in last 24 hours: Temp:  [97.9 F (36.6 C)-98.5 F (36.9 C)] 98.5 F (36.9 C) (08/02 1405) Pulse Rate:  [61-74] 65 (08/02 1405) Resp:  [14-17] 17 (08/02 1405) BP: (100-118)/(65-81) 100/74 (08/02 1405) SpO2:  [97 %-100 %] 99 % (08/02 1405) Weight:  [76.5 kg (168 lb 10.4 oz)] 76.5 kg (168 lb 10.4 oz) (08/02 0628)  Intake/Output from previous day: 08/01 0701 - 08/02 0700 In: 1000 [P.O.:500; IV Piggyback:500] Out: -  Intake/Output this shift: Total I/O In: 470 [P.O.:470] Out: -    Recent Labs  08/02/16 2332 08/03/16 0434 08/04/16 0514 08/05/16 0458  HGB 13.2 12.7 12.5 11.8*    Recent Labs  08/04/16 0514 08/05/16 0458  WBC 8.3 8.6  RBC 4.04 3.84*  HCT 38.7 36.7  PLT 353 341    Recent Labs  08/04/16 0514 08/05/16 0458  NA 137 138  K 4.5 4.2  CL 104 105  CO2 26 27  BUN 9 13  CREATININE 0.63 0.77  GLUCOSE 151* 98  CALCIUM 9.0 8.5*   No results for input(s): LABPT, INR in the last 72 hours. Cultures to date are still pending as initial cultures did show gram-positive cocci, however there has been no growth on cultures at this juncture. The patient is alert and oriented in no acute distress. The patient complains of pain in the affected upper extremity.  The patient is noted to have a normal HEENT exam. Lung fields show equal chest expansion and no shortness of breath. Abdomen exam is nontender without distention. Lower extremity examination does not show any fracture dislocation or blood clot symptoms. Pelvis is stable and the neck and back are stable and nontender. Examination of the left upper extremity: Dressings are removed. Her wound is intact, the drains are removed.  She has mild serosanguineous discharge present there is no purulent discharge at this juncture. She has developed some mild pre-necrotic tissue about the wound edges and slight proteinaceous debris. He has no signs of ascending cellulitis present Assessment/Plan: 2 Days Post-Op Procedure(s) (LRB): IRRIGATION AND DEBRIDEMENT LEFT HAND (Left) I have discussed with the patient today repeating a bedside irrigation and excisional debridement. Overall, her wounds are improving and there is no advanced signs of cellulitis or accumulating purulence. However, given the pre-necrotic tissue and proteinaceous debris a would recommend debridement about the depths of the wounds. The patient understands and agrees. After obtaining verbal consent wounds were irrigated copiously with sterile water following this pickup and scissors was utilized to perform sharp excisional debridement of the wound edges about the skin and subcutaneous tissue. The she went repeat irrigation and excisional debridement of the dorsal hand wounds. 2 wounds were addressed less than 2.5 cm each in nature she tolerated this well there was no complications the wound was then once again irrigated and small wet wicking's were placed in the wound followed by dry dressings and a splint. We have discussed with her were going to continue to monitor her closely and will change her dressings tomorrow. She will most likely be ready for discharge tomorrow based on her wound conditions today. Once again cultures are still pending. All questions were encouraged and answered.  Tinleigh Whitmire L 08/05/2016, 3:01 PM

## 2016-08-05 NOTE — Progress Notes (Signed)
Pharmacy Antibiotic Note  [name] is a 36 y.o. female admitted on 08/02/2016 with cellulitis/wound infection of L hand-- s/p I&D on 7/31.  Patient's currently on vancomycin and levaquin for infection.  Today, 08/05/2016: - day #3 abx - afeb, wbc wnl -  scr stable 0.77 (crcl~94) - vancomycin trough level came back this morning therapeutic at 11 (goal 10-15)  Plan:  Continue Vancomycin 1000 mg IV q12 hr  Continue Levaquin 500mg  IV daily  PCN allergy is only n/v - Consider switching levaquin to a cephalosporin to avoid fluoroquinolones associated side effects. _________________________________  Height: 5\' 3"  (160 cm) Weight: 168 lb 10.4 oz (76.5 kg) IBW/kg (Calculated) : 52.4  Temp (24hrs), Avg:98.1 F (36.7 C), Min:97.9 F (36.6 C), Max:98.2 F (36.8 C)   Recent Labs Lab 08/02/16 2332 08/02/16 2347 08/03/16 0434 08/04/16 0514 08/05/16 0458 08/05/16 0902  WBC 7.9  --  6.9 8.3 8.6  --   CREATININE 0.76  --  0.63 0.63 0.77  --   LATICACIDVEN  --  0.68  --   --   --   --   VANCOTROUGH  --   --   --   --   --  11*    Estimated Creatinine Clearance: 94.2 mL/min (by C-G formula based on SCr of 0.77 mg/dL).    Allergies  Allergen Reactions  . Motrin [Ibuprofen]     Hives emesis  . Hydrocodone Hives  . Penicillins Nausea And Vomiting  . Ultram [Tramadol] Nausea And Vomiting  . Aspirin Nausea And Vomiting and Rash   Antimicrobials this admission: 7/31 Vanc >> 7/31 Levaquin >>  Dose adjustments/drug levels this admission: 8/2 Vanc Trough at 0900 = 11 (1g q12h)  Microbiology results: 7/30 blood x 2:  7/30 left hand wound:     Thank you for allowing pharmacy to be a part of this patient's care.   Dorna LeitzAnh Charlton Boule, PharmD, BCPS 08/05/2016 10:38 AM

## 2016-08-05 NOTE — Care Management Note (Signed)
Case Management Note  Patient Details  Name: Abigail FieldingCrystal G Aburto MRN: [id number] Date of Birth: 12/14/1980  Subjective/Objective: 36 y/o f admitted w/L hand cellulitis. From home. Ortho following-s/p I&D L hand.                   Action/Plan:d/c plan home.   Expected Discharge Date:                  Expected Discharge Plan:  Home/Self Care  In-House Referral:     Discharge planning Services  CM Consult  Post Acute Care Choice:    Choice offered to:     DME Arranged:    DME Agency:     HH Arranged:    HH Agency:     Status of Service:  In process, will continue to follow  If discussed at Long Length of Stay Meetings, dates discussed:    Additional Comments:  Lanier ClamMahabir, Lajuan Godbee, RN 08/05/2016, 10:58 AM

## 2016-08-06 LAB — C-REACTIVE PROTEIN: CRP: <0.8 mg/dL (ref <1.0)

## 2016-08-06 LAB — AEROBIC CULTURE W GRAM STAIN (SUPERFICIAL SPECIMEN)

## 2016-08-06 LAB — BASIC METABOLIC PANEL
ANION GAP: 4 — AB (ref 5–15)
BUN: 13 mg/dL (ref 6–20)
CO2: 31 mmol/L (ref 22–32)
Calcium: 8.7 mg/dL — ABNORMAL LOW (ref 8.9–10.3)
Chloride: 104 mmol/L (ref 101–111)
Creatinine, Ser: 0.81 mg/dL (ref 0.44–1.00)
GFR calc Af Amer: >60 mL/min (ref >60)
GLUCOSE: 86 mg/dL (ref 65–99)
POTASSIUM: 4.2 mmol/L (ref 3.5–5.1)
Sodium: 139 mmol/L (ref 135–145)

## 2016-08-06 LAB — CBC WITH DIFFERENTIAL/PLATELET
BASOS ABS: 0.1 10*3/uL (ref 0.0–0.1)
Basophils Relative: 1 %
Eosinophils Absolute: 0.2 10*3/uL (ref 0.0–0.7)
Eosinophils Relative: 2 %
HEMATOCRIT: 37.4 % (ref 36.0–46.0)
Hemoglobin: 12.2 g/dL (ref 12.0–15.0)
LYMPHS PCT: 42 %
Lymphs Abs: 3.1 10*3/uL (ref 0.7–4.0)
MCH: 30.5 pg (ref 26.0–34.0)
MCHC: 32.6 g/dL (ref 30.0–36.0)
MCV: 93.5 fL (ref 78.0–100.0)
Monocytes Absolute: 0.7 10*3/uL (ref 0.1–1.0)
Monocytes Relative: 10 %
NEUTROS ABS: 3.3 10*3/uL (ref 1.7–7.7)
NEUTROS PCT: 45 %
Platelets: 357 10*3/uL (ref 150–400)
RBC: 4 MIL/uL (ref 3.87–5.11)
RDW: 13.6 % (ref 11.5–15.5)
WBC: 7.4 10*3/uL (ref 4.0–10.5)

## 2016-08-06 LAB — AEROBIC CULTURE  (SUPERFICIAL SPECIMEN): CULTURE: NO GROWTH

## 2016-08-06 LAB — MAGNESIUM: Magnesium: 2 mg/dL (ref 1.7–2.4)

## 2016-08-06 MED ORDER — CEPHALEXIN 500 MG PO CAPS: 500.0000 mg | ORAL_CAPSULE | Freq: Three times a day (TID) | ORAL | 0 refills | Status: AC

## 2016-08-06 MED ORDER — DOXYCYCLINE HYCLATE 50 MG PO CAPS: 50.0000 mg | ORAL_CAPSULE | Freq: Two times a day (BID) | ORAL | 0 refills | Status: AC

## 2016-08-06 MED ORDER — OXYCODONE HCL 5 MG PO TABS: 5.0000 mg | ORAL_TABLET | Freq: Four times a day (QID) | ORAL | 0 refills | Status: DC | PRN

## 2016-08-06 NOTE — Progress Notes (Signed)
Subjective: 3 Days Post-Op Procedure(s) (LRB): IRRIGATION AND DEBRIDEMENT LEFT HAND (Left) Patient reports pain as controlled at this juncture. She is very eager for discharge. She denies nausea, vomiting, fever or chills.  Objective: Vital signs in last 24 hours: Temp:  [97.9 F (36.6 C)-98 F (36.7 C)] 97.9 F (36.6 C) (08/03 0522) Pulse Rate:  [53-70] 53 (08/03 0522) Resp:  [16-18] 16 (08/03 0522) BP: (95-98)/(61-68) 98/61 (08/03 0522) SpO2:  [100 %] 100 % (08/03 0522) Weight:  [74.4 kg (164 lb 0.4 oz)] 74.4 kg (164 lb 0.4 oz) (08/03 0522)  Intake/Output from previous day: 08/02 0701 - 08/03 0700 In: 970 [P.O.:470; IV Piggyback:500] Out: -  Intake/Output this shift: Total I/O In: 480 [P.O.:480] Out: -    Recent Labs  08/04/16 0514 08/05/16 0458 08/06/16 0512  HGB 12.5 11.8* 12.2    Recent Labs  08/05/16 0458 08/06/16 0512  WBC 8.6 7.4  RBC 3.84* 4.00  HCT 36.7 37.4  PLT 341 357    Recent Labs  08/05/16 0458 08/06/16 0512  NA 138 139  K 4.2 4.2  CL 105 104  CO2 27 31  BUN 13 13  CREATININE 0.77 0.81  GLUCOSE 98 86  CALCIUM 8.5* 8.7*   Results for orders placed or performed during the hospital encounter of 08/02/16  Wound or Superficial Culture     Status: None   Collection Time: 08/02/16 11:55 PM  Result Value Ref Range Status   Specimen Description HAND LEFT  Final   Special Requests NONE  Final   Gram Stain   Final    RARE WBC PRESENT,BOTH PMN AND MONONUCLEAR RARE GRAM POSITIVE COCCI    Culture   Final    NO GROWTH Performed at [facility], [address] [place], [place] [address]    Report Status 08/06/2016 FINAL  Final  Blood culture (routine x 2)     Status: None (Preliminary result)   Collection Time: 08/03/16 12:03 AM  Result Value Ref Range Status   Specimen Description BLOOD RIGHT ANTECUBITAL  Final   Special Requests   Final    BOTTLES DRAWN AEROBIC AND ANAEROBIC Blood Culture adequate volume   Culture   Final    NO  GROWTH 3 DAYS Performed at [facility], [address] [place], [place] [address]    Report Status PENDING  Incomplete  Aerobic/Anaerobic Culture (surgical/deep wound)     Status: None (Preliminary result)   Collection Time: 08/03/16 11:25 PM  Result Value Ref Range Status   Specimen Description TISSUE HAND  Final   Special Requests NONE  Final   Gram Stain   Final    RARE WBC PRESENT, PREDOMINANTLY PMN NO ORGANISMS SEEN    Culture   Final    NO GROWTH 2 DAYS NO ANAEROBES ISOLATED; CULTURE IN PROGRESS FOR 5 DAYS Performed at [facility], [address] [place], [place] [address]    Report Status PENDING  Incomplete   No results for input(s): LABPT, INR in the last 72 hours.  The patient is alert and oriented in no acute distress. The patient complains of pain in the affected upper extremity.  The patient is noted to have a normal HEENT exam. Lung fields show equal chest expansion and no shortness of breath. Abdomen exam is nontender without distention. Lower extremity examination does not show any fracture dislocation or blood clot symptoms. Pelvis is stable and the neck and back are stable and nontender. Examination of  the left upper extremity shows that her incision is intact she does not have a purulent discharge present she has mild serous discharge present with them expression about the wound. The wicks are removed without difficulty. There is no significant erythema she's had a marked amount improvement overall in her wound conditions. Sutures are intact. Digital range of motion is intact. No signs of ascending cellulitis are present.  Assessment/Plan: 3 Days Post-Op Procedure(s) (LRB): IRRIGATION AND DEBRIDEMENT LEFT HAND (Left) I discussed with the patient at this juncture her wound is stable her cultures still show no final him growth although gram-positive cocci was noted initially. I discussed this with the hospitalist, who will provide antibiotic regime  postoperatively form of doxycycline and potentially Keflex as well depending on the patient's penicillin allergy. We have discussed with the patient she will need to keep her splint clean, dry, intact and do not remove this. She will elevate as needed. We placed her into a new splint today. We will need to see her next Tuesday in a.m. She will call for any questions or concerns. All discharge issues but discussed length.  Quantrell Splitt L 08/06/2016, 2:07 PM

## 2016-08-06 NOTE — Discharge Summary (Signed)
Physician Discharge Summary  Abigail FieldingCrystal G [name] ZOX:[id number] DOB: 10/18/1980 DOA: 08/02/2016  PCP: Merlyn AlbertLuking, William S, MD  Admit date: 08/02/2016 Discharge date: 08/06/2016  Admitted From: Home Disposition:  Home  Recommendations for Outpatient Follow-up:  1. Follow up with PCP in 1 week 2. Please obtain BMP/CBC in one week 3. Follow-up with orthopedics as scheduled next week 4. Wound care as per orthopedics recommendations  Home Health: None  Equipment/Devices: None  Discharge Condition: Stable CODE STATUS: Full Diet recommendation:  Regular   Brief/Interim Summary: 36 year old female with history of chronic pain, anxiety presented with wound on dorsum of left hand that was lanced by her friend as an outpatient. Patient was admitted and started on IV antibiotics. Patient had I&D done by orthopedics on 08/03/2016. Cultures have been negative. Patient is afebrile. Patient has been cleared for discharge by orthopedics with outpatient follow-up next week.  Discharge Diagnoses:  Active Problems:   Skin ulcer (HCC)   Cellulitis   Cellulitis of hand, left  Left hand cellulitis with ulcer/abscess - Status post I&D by orthopedics on 08/03/2016.  - OR culture is growing gram-positive cocci; no identification so far  - Currently on  IV vancomycin and Levaquin - Discharged home on oral doxycycline and Keflex for another 7 days. Follow-up with orthopedics next week. Wound care as per orthopedics recommendations  Anxiety - Outpatient follow-up with primary care provider  Polysubstance abuse Counseled about cessation of polysubstance abuse  Discharge Instructions  Discharge Instructions    Call MD for:  difficulty breathing, headache or visual disturbances    Complete by:  As directed    Call MD for:  extreme fatigue    Complete by:  As directed    Call MD for:  hives    Complete by:  As directed    Call MD for:  persistant dizziness or light-headedness    Complete by:  As directed     Call MD for:  persistant nausea and vomiting    Complete by:  As directed    Call MD for:  redness, tenderness, or signs of infection (pain, swelling, redness, odor or green/yellow discharge around incision site)    Complete by:  As directed    Call MD for:  severe uncontrolled pain    Complete by:  As directed    Call MD for:  temperature >100.4    Complete by:  As directed    Diet general    Complete by:  As directed    Discharge instructions    Complete by:  As directed    Wound care as per Orthopedics recommendations   Increase activity slowly    Complete by:  As directed      Allergies as of 08/06/2016      Reactions   Motrin [ibuprofen]    Hives emesis   Hydrocodone Hives   Penicillins Nausea And Vomiting   Ultram [tramadol] Nausea And Vomiting   Aspirin Nausea And Vomiting, Rash      Medication List    TAKE these medications   cephALEXin 500 MG capsule Commonly known as:  KEFLEX Take 1 capsule (500 mg total) by mouth 3 (three) times daily.   doxycycline 50 MG capsule Commonly known as:  VIBRAMYCIN Take 1 capsule (50 mg total) by mouth 2 (two) times daily.   oxyCODONE 5 MG immediate release tablet Commonly known as:  Oxy IR/ROXICODONE Take 1 tablet (5 mg total) by mouth every 6 (six) hours as needed for moderate pain or severe  pain.      Follow-up Information    Merlyn AlbertLuking, William S, MD Follow up in 1 week(s).   Specialty:  Family Medicine Contact information: [address] [place] [address] [phone]        Dominica SeverinGramig, William, MD Follow up.   Specialty:  Orthopedic Surgery Why:  next week  Contact information: [address] [place] [address] [phone]          Allergies  Allergen Reactions  . Motrin [Ibuprofen]     Hives emesis  . Hydrocodone Hives  . Penicillins Nausea And Vomiting  . Ultram [Tramadol] Nausea And Vomiting  . Aspirin Nausea And Vomiting and Rash     Consultations:  Orthopedics   Procedures/Studies: Dg Hand Complete Left  Result Date: 08/02/2016 CLINICAL DATA:  Possible spider bite with diffuse left hand swelling, dorsal wound, redness and tingling with pain. EXAM: LEFT HAND - COMPLETE 3+ VIEW COMPARISON:  None. FINDINGS: There is a focal collection of air or gas in the dorsal soft tissues of the hand, dorsal to the fourth metacarpal and space between the fourth and fifth metacarpals. This collection of gas or air measures 1.5 cm in maximum diameter. There is diffuse dorsal soft tissue swelling. The bones have normal appearances. IMPRESSION: Diffuse dorsal soft tissue swelling with a probable focal open wound containing air. Otherwise, no soft tissue gas is seen and no bony abnormality is demonstrated. Electronically Signed   By: Beckie SaltsSteven  Reid M.D.   On: 08/02/2016 23:48    I & D on 08/03/2016   Subjective: Patient seen and examined at bedside. She denies any overnight fever, nausea, vomiting. She feels better and wants to go home.  Discharge Exam: Vitals:   08/06/16 0522 08/06/16 1423  BP: 98/61 112/90  Pulse: (!) 53 89  Resp: 16 17  Temp: 97.9 F (36.6 C) 98.9 F (37.2 C)   Vitals:   08/05/16 1405 08/05/16 2122 08/06/16 0522 08/06/16 1423  BP: 100/74 95/68 98/61  112/90  Pulse: 65 70 (!) 53 89  Resp: 17 18 16 17   Temp: 98.5 F (36.9 C) 98 F (36.7 C) 97.9 F (36.6 C) 98.9 F (37.2 C)  TempSrc: Oral Oral Oral Oral  SpO2: 99% 100% 100% 97%  Weight:   74.4 kg (164 lb 0.4 oz)   Height:        General: Pt is alert, awake, not in acute distress Cardiovascular: RRR, S1/S2 + Respiratory: CTA bilaterally, no wheezing, no rhonchi Abdominal: Soft, NT, ND, bowel sounds + Extremities: no edema, no cyanosis; Left hand and wrist dressing present    The results of significant diagnostics from this hospitalization (including imaging, microbiology, ancillary and laboratory) are listed below for reference.      Microbiology: Recent Results (from the past 240 hour(s))  Wound or Superficial Culture     Status: None   Collection Time: 08/02/16 11:55 PM  Result Value Ref Range Status   Specimen Description HAND LEFT  Final   Special Requests NONE  Final   Gram Stain   Final    RARE WBC PRESENT,BOTH PMN AND MONONUCLEAR RARE GRAM POSITIVE COCCI    Culture   Final    NO GROWTH Performed at [facility], [address] [place], [place] [address]    Report Status 08/06/2016 FINAL  Final  Blood culture (routine x 2)     Status: None (Preliminary result)   Collection Time: 08/03/16 12:03 AM  Result Value Ref Range  Status   Specimen Description BLOOD RIGHT ANTECUBITAL  Final   Special Requests   Final    BOTTLES DRAWN AEROBIC AND ANAEROBIC Blood Culture adequate volume   Culture   Final    NO GROWTH 3 DAYS Performed at [facility], [address] [place], [place] [address]    Report Status PENDING  Incomplete  Aerobic/Anaerobic Culture (surgical/deep wound)     Status: None (Preliminary result)   Collection Time: 08/03/16 11:25 PM  Result Value Ref Range Status   Specimen Description TISSUE HAND  Final   Special Requests NONE  Final   Gram Stain   Final    RARE WBC PRESENT, PREDOMINANTLY PMN NO ORGANISMS SEEN    Culture   Final    NO GROWTH 2 DAYS NO ANAEROBES ISOLATED; CULTURE IN PROGRESS FOR 5 DAYS Performed at [facility], [address], [place], [place] [address]    Report Status PENDING  Incomplete     Labs: BNP (last 3 results) No results for input(s): BNP in the last 8760 hours. Basic Metabolic Panel:  Recent Labs Lab 08/02/16 2332 08/03/16 0434 08/04/16 0514 08/05/16 0458 08/06/16 0512  NA 137 139 137 138 139  K 4.0 3.8 4.5 4.2 4.2  CL 101 105 104 105 104  CO2 27 26 26 27 31   GLUCOSE 87 83 151* 98 86  BUN 10 8 9 13 13   CREATININE 0.76 0.63 0.63 0.77 0.81  CALCIUM 8.9 8.6* 9.0 8.5* 8.7*  MG  --   --   --   --  2.0   Liver Function  Tests:  Recent Labs Lab 08/03/16 0434  AST 63*  ALT 123*  ALKPHOS 44  BILITOT 0.5  PROT 6.7  ALBUMIN 3.3*   No results for input(s): LIPASE, AMYLASE in the last 168 hours. No results for input(s): AMMONIA in the last 168 hours. CBC:  Recent Labs Lab 08/02/16 2332 08/03/16 0434 08/04/16 0514 08/05/16 0458 08/06/16 0512  WBC 7.9 6.9 8.3 8.6 7.4  NEUTROABS 3.9  --   --  4.8 3.3  HGB 13.2 12.7 12.5 11.8* 12.2  HCT 39.7 39.2 38.7 36.7 37.4  MCV 95.4 95.4 95.8 95.6 93.5  PLT 370 370 353 341 357   Cardiac Enzymes: No results for input(s): CKTOTAL, CKMB, CKMBINDEX, TROPONINI in the last 168 hours. BNP: Invalid input(s): POCBNP CBG: No results for input(s): GLUCAP in the last 168 hours. D-Dimer No results for input(s): DDIMER in the last 72 hours. Hgb A1c No results for input(s): HGBA1C in the last 72 hours. Lipid Profile No results for input(s): CHOL, HDL, LDLCALC, TRIG, CHOLHDL, LDLDIRECT in the last 72 hours. Thyroid function studies No results for input(s): TSH, T4TOTAL, T3FREE, THYROIDAB in the last 72 hours.  Invalid input(s): FREET3 Anemia work up No results for input(s): VITAMINB12, FOLATE, FERRITIN, TIBC, IRON, RETICCTPCT in the last 72 hours. Urinalysis    Component Value Date/Time   COLORURINE YELLOW 04/03/2014 1830   APPEARANCEUR CLEAR 04/03/2014 1830   LABSPEC 1.010 04/03/2014 1830   PHURINE 7.5 04/03/2014 1830   GLUCOSEU NEGATIVE 04/03/2014 1830   HGBUR NEGATIVE 04/03/2014 1830   BILIRUBINUR NEGATIVE 04/03/2014 1830   KETONESUR NEGATIVE 04/03/2014 1830   PROTEINUR NEGATIVE 04/03/2014 1830   UROBILINOGEN 1.0 04/03/2014 1830   NITRITE NEGATIVE 04/03/2014 1830   LEUKOCYTESUR NEGATIVE 04/03/2014 1830   Sepsis Labs Invalid input(s): PROCALCITONIN,  WBC,  LACTICIDVEN Microbiology Recent Results (from the past 240 hour(s))  Wound or Superficial Culture  Status: None   Collection Time: 08/02/16 11:55 PM  Result Value Ref Range Status   Specimen  Description HAND LEFT  Final   Special Requests NONE  Final   Gram Stain   Final    RARE WBC PRESENT,BOTH PMN AND MONONUCLEAR RARE GRAM POSITIVE COCCI    Culture   Final    NO GROWTH Performed at [facility], [address], [place], [place] [address]    Report Status 08/06/2016 FINAL  Final  Blood culture (routine x 2)     Status: None (Preliminary result)   Collection Time: 08/03/16 12:03 AM  Result Value Ref Range Status   Specimen Description BLOOD RIGHT ANTECUBITAL  Final   Special Requests   Final    BOTTLES DRAWN AEROBIC AND ANAEROBIC Blood Culture adequate volume   Culture   Final    NO GROWTH 3 DAYS Performed at [facility], [address] [place], [place] [address]    Report Status PENDING  Incomplete  Aerobic/Anaerobic Culture (surgical/deep wound)     Status: None (Preliminary result)   Collection Time: 08/03/16 11:25 PM  Result Value Ref Range Status   Specimen Description TISSUE HAND  Final   Special Requests NONE  Final   Gram Stain   Final    RARE WBC PRESENT, PREDOMINANTLY PMN NO ORGANISMS SEEN    Culture   Final    NO GROWTH 2 DAYS NO ANAEROBES ISOLATED; CULTURE IN PROGRESS FOR 5 DAYS Performed at [facility], [address] [place], [place] [address]    Report Status PENDING  Incomplete     Time coordinating discharge: 35 minutes  SIGNED:   Glade Lloyd, MD  [facility] 08/06/2016, 2:40 PM Pager: [phone]  If 7PM-7AM, please contact night-coverage [email or web address] Password [email or web address]

## 2016-08-08 LAB — CULTURE, BLOOD (ROUTINE X 2)
Culture: NO GROWTH
Special Requests: ADEQUATE

## 2016-08-09 LAB — AEROBIC/ANAEROBIC CULTURE W GRAM STAIN (SURGICAL/DEEP WOUND): Culture: NO GROWTH

## 2016-08-09 LAB — AEROBIC/ANAEROBIC CULTURE (SURGICAL/DEEP WOUND)

## 2016-09-08 IMAGING — CR DG THORACIC SPINE 3V
3 series · 3 of 3 positions shown · non-contrast
Comparison: None.

CLINICAL DATA: Back pain for 3 months, no known injury, initial
encounter

EXAM:
THORACIC SPINE - 2 VIEW + SWIMMERS

[view not recorded (1 of 3)]
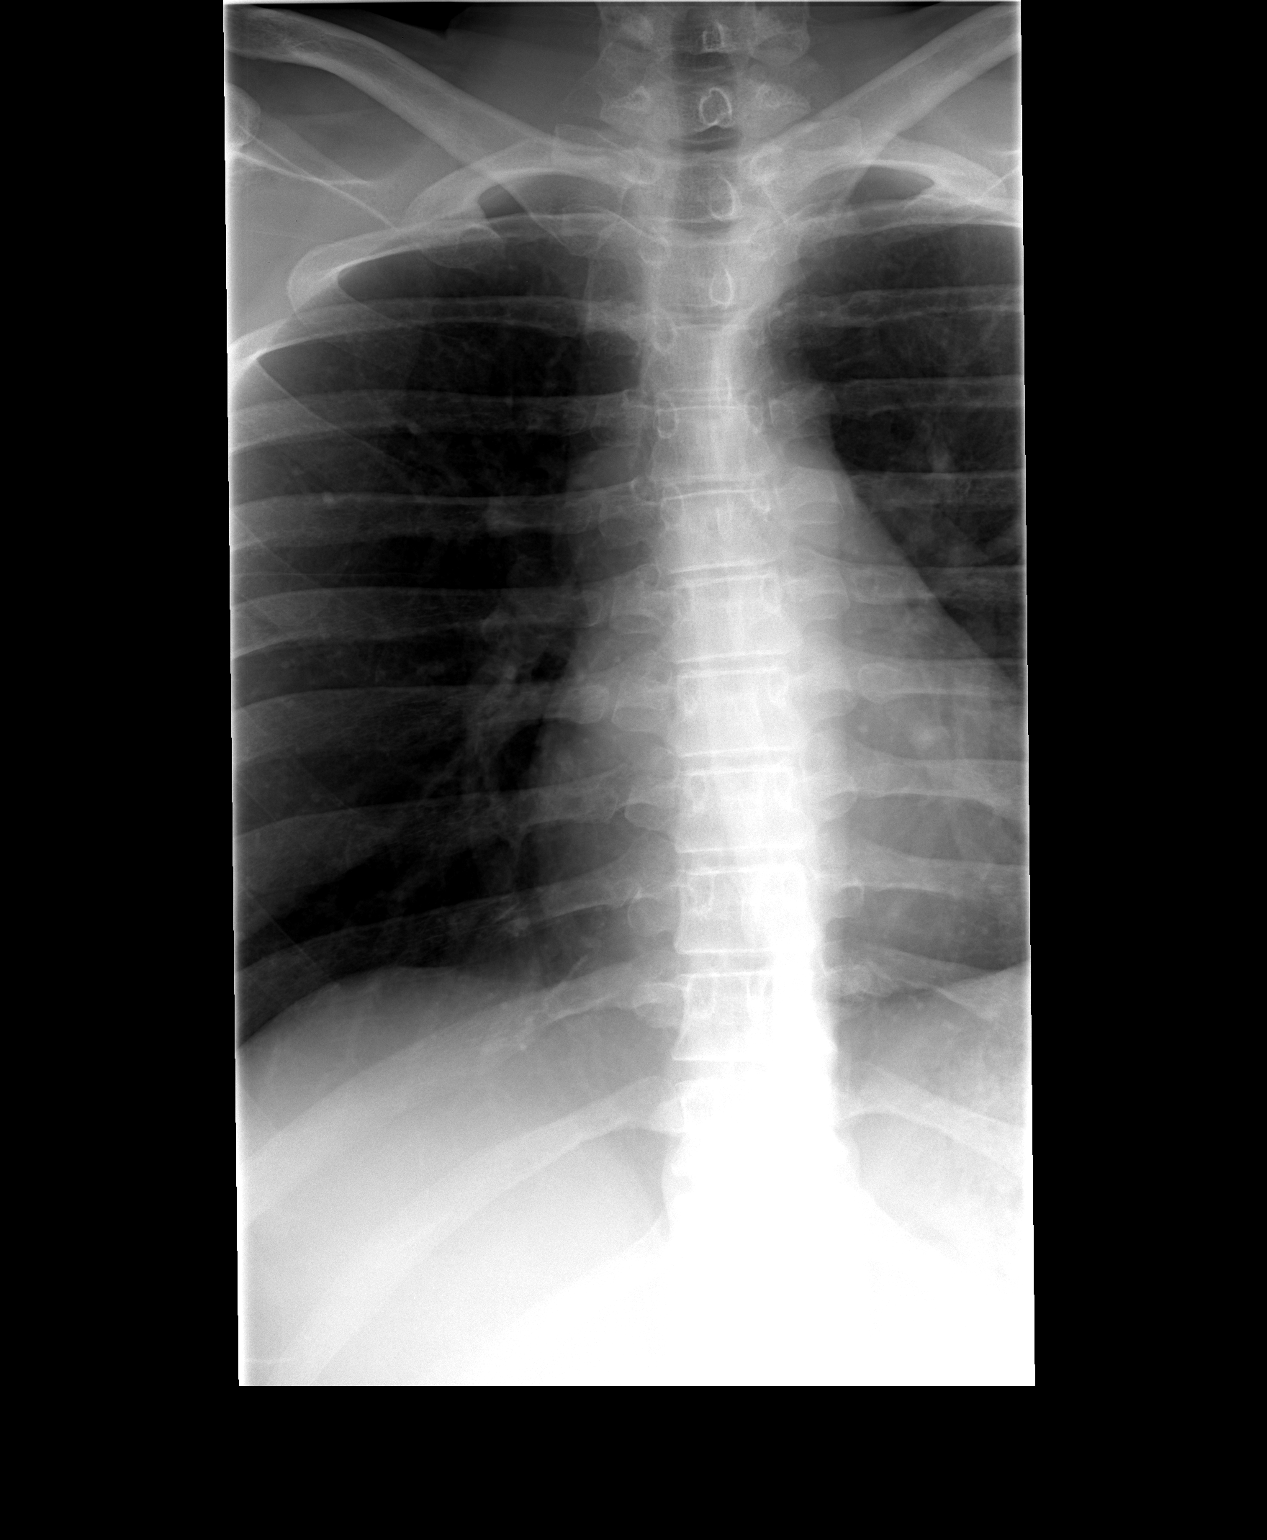

[view not recorded (2 of 3)]
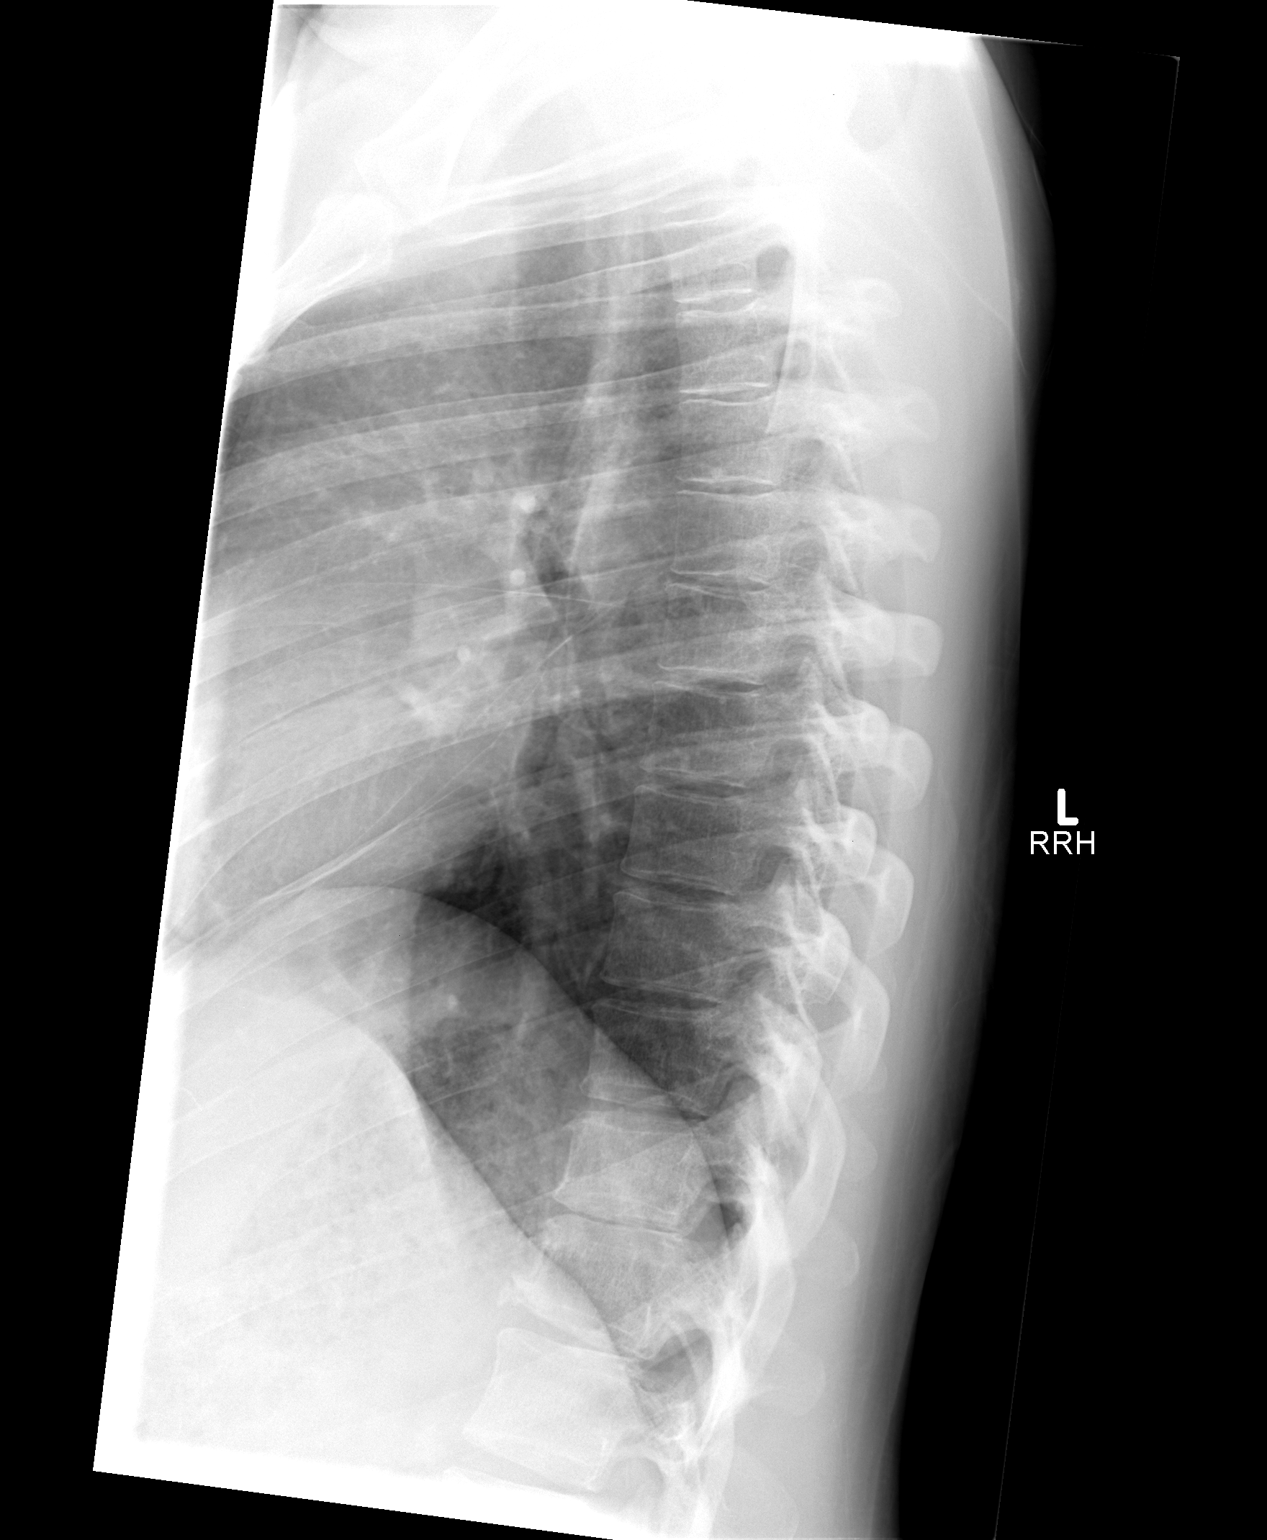

[view not recorded (3 of 3)]
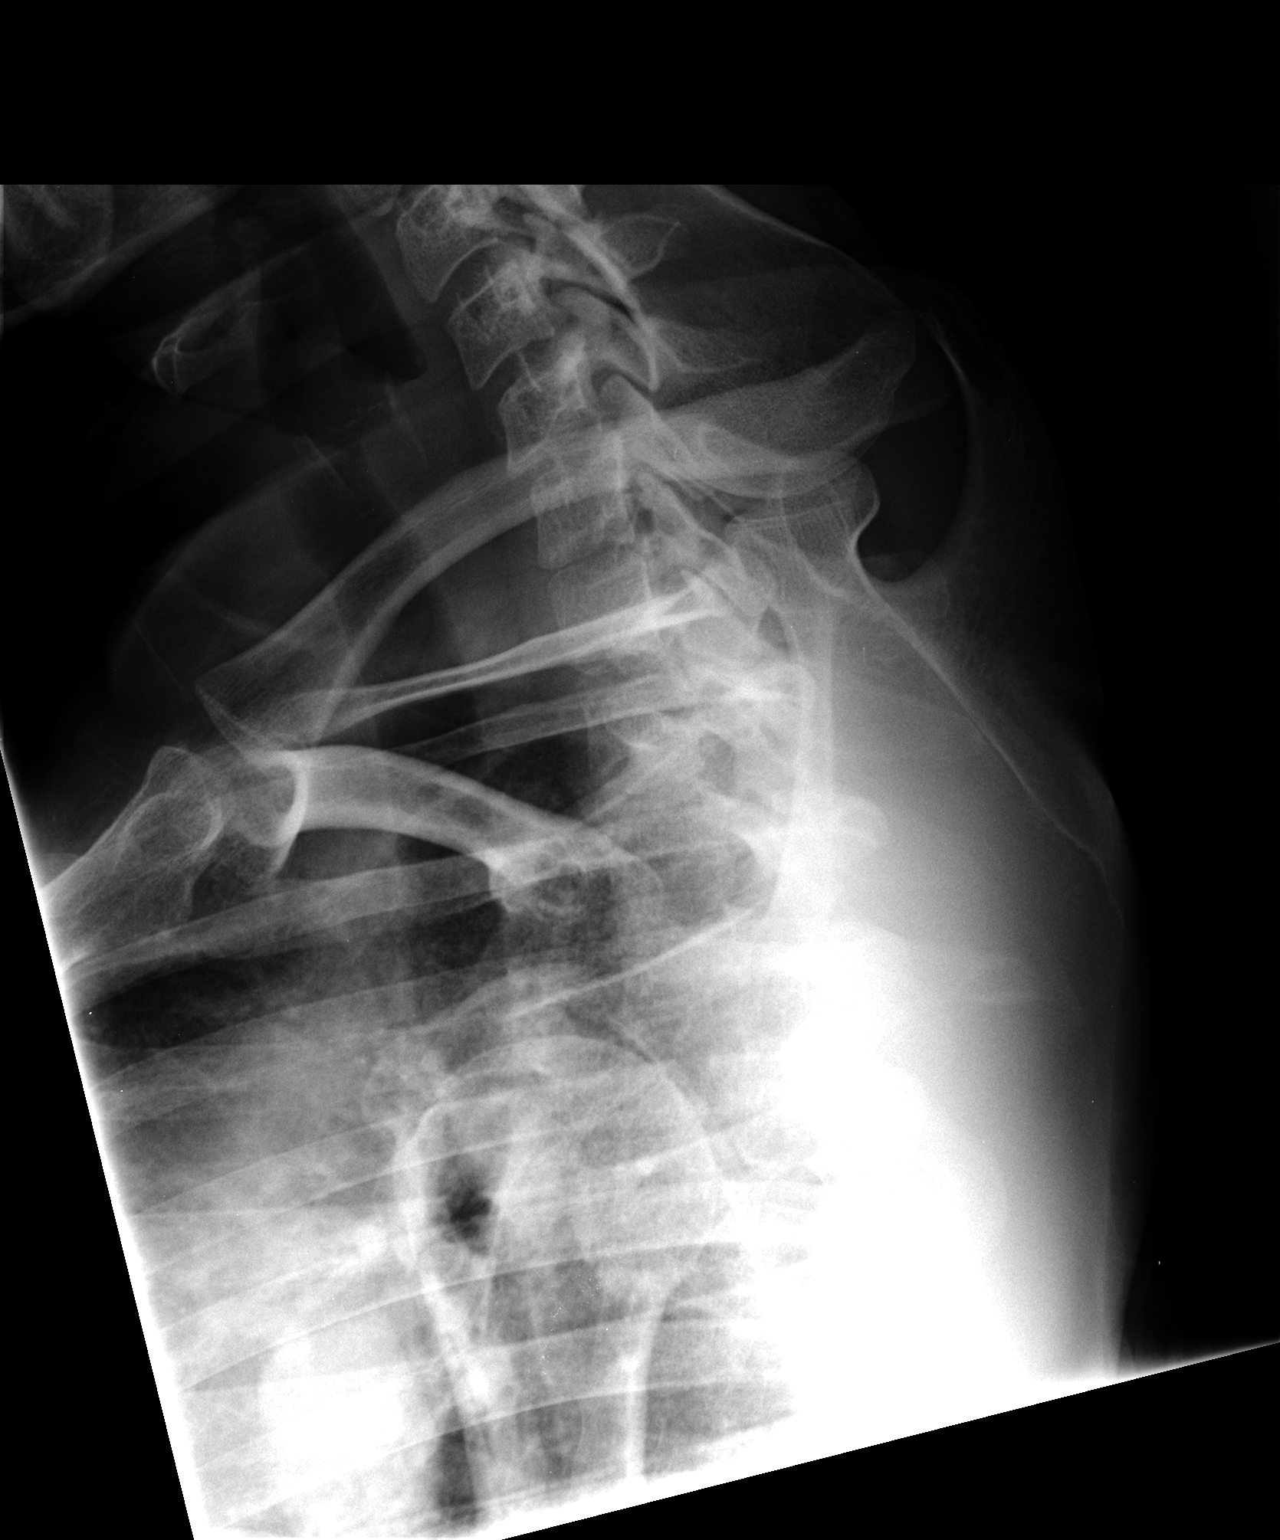

[3 of 3 positions shown; findings below may reference images not displayed]

FINDINGS: No compression deformities are noted. Very mild osteophytic changes
are seen. No pedicle abnormality is noted. No paraspinal mass is
seen.
IMPRESSION: Minimal degenerative change.  No acute abnormality is noted.

## 2016-12-11 IMAGING — CR DG LUMBAR SPINE 2-3V
1 series · 1 of 1 positions shown · non-contrast
Comparison: MRI of 01/18/2014.

CLINICAL DATA: Discectomy.

EXAM:
LUMBAR SPINE - 2-3 VIEW

[lat]
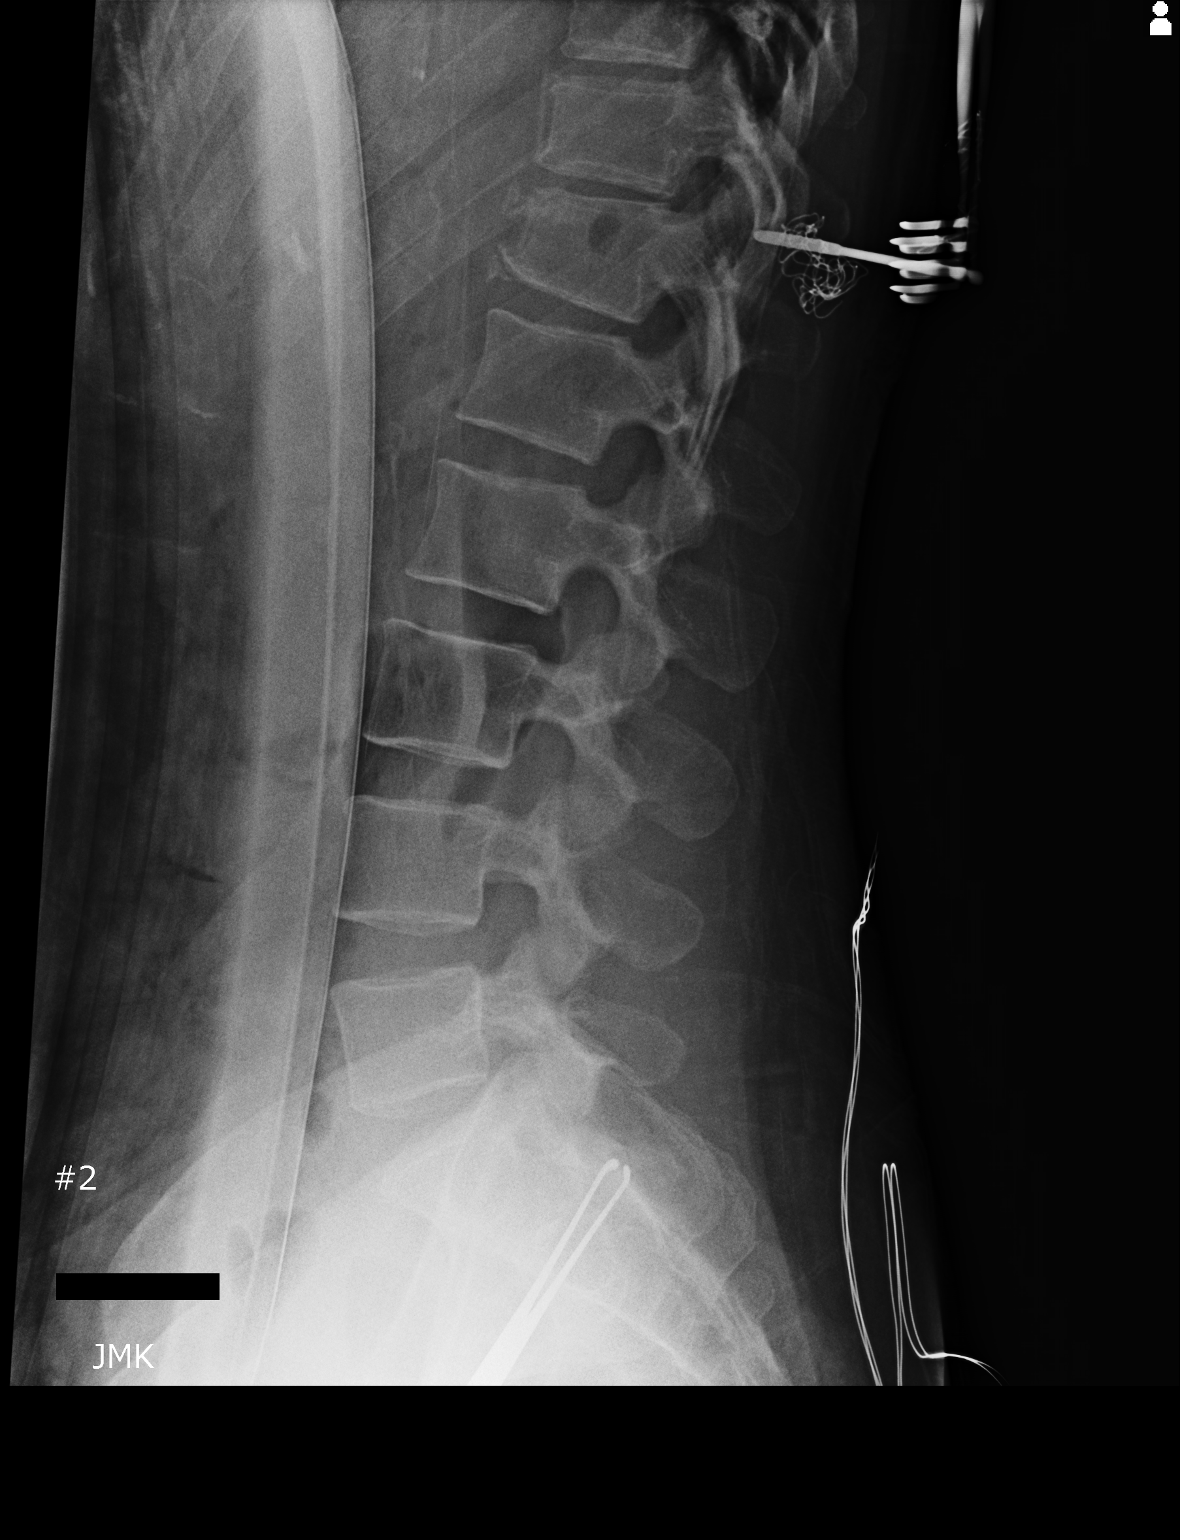

[1 of 1 positions shown; findings below may reference images not displayed]

FINDINGS: Lumbar vertebra are numbered as per prior MRI with lowest segmented
appearing lumbar vertebra on lateral view is L5. Diffuse
degenerative change. Mild stable compression fracture of T12 noted.
Metallic marker is noted at the T11 vertebral body level.
IMPRESSION: Metallic marker noted at the T11 vertebral body level.

## 2017-07-04 DIAGNOSIS — Z029 Encounter for administrative examinations, unspecified: Secondary | ICD-10-CM

## 2017-08-20 ENCOUNTER — Encounter (HOSPITAL_COMMUNITY): Payer: Self-pay

## 2017-08-20 ENCOUNTER — Emergency Department (HOSPITAL_COMMUNITY)
Admission: EM | Admit: 2017-08-20 | Discharge: 2017-08-20 | Disposition: A | Discharge status: Home / Self Care | Payer: Medicaid Other | Attending: Emergency Medicine | Admitting: Emergency Medicine

## 2017-08-20 DIAGNOSIS — F1410 Cocaine abuse, uncomplicated: Secondary | ICD-10-CM | POA: Diagnosis not present

## 2017-08-20 DIAGNOSIS — F1510 Other stimulant abuse, uncomplicated: Secondary | ICD-10-CM | POA: Insufficient documentation

## 2017-08-20 DIAGNOSIS — G8929 Other chronic pain: Secondary | ICD-10-CM | POA: Insufficient documentation

## 2017-08-20 DIAGNOSIS — F419 Anxiety disorder, unspecified: Secondary | ICD-10-CM | POA: Diagnosis present

## 2017-08-20 DIAGNOSIS — M549 Dorsalgia, unspecified: Secondary | ICD-10-CM | POA: Diagnosis not present

## 2017-08-20 DIAGNOSIS — F17210 Nicotine dependence, cigarettes, uncomplicated: Secondary | ICD-10-CM | POA: Diagnosis not present

## 2017-08-20 DIAGNOSIS — F1910 Other psychoactive substance abuse, uncomplicated: Secondary | ICD-10-CM

## 2017-08-20 DIAGNOSIS — Z720 Tobacco use: Secondary | ICD-10-CM

## 2017-08-20 DIAGNOSIS — G4700 Insomnia, unspecified: Secondary | ICD-10-CM | POA: Diagnosis not present

## 2017-08-20 LAB — RAPID URINE DRUG SCREEN, HOSP PERFORMED
AMPHETAMINES: POSITIVE — AB
BENZODIAZEPINES: NOT DETECTED
Barbiturates: NOT DETECTED
Cocaine: POSITIVE — AB
Tetrahydrocannabinol: NOT DETECTED

## 2017-08-20 NOTE — ED Notes (Signed)
Bed: [facility]3 Expected date:  Expected time:  Means of arrival:  Comments: 

## 2017-08-20 NOTE — Discharge Instructions (Addendum)
STOP USING DRUGS AND SMOKING CIGARETTES! Use the list of resources below to find assistance with drug abuse and getting clean, in addition to the resources given to you by the peer support specialist today. Follow up with your regular doctor in the next 1-2 weeks to discuss getting back on your medications, and for recheck of symptoms and ongoing management of your medical care. Return to the ER for emergent changes or worsening symptoms.

## 2017-08-20 NOTE — ED Notes (Addendum)
Pt informed of need to urinate. Pt given sprite

## 2017-08-20 NOTE — ED Notes (Signed)
Bed: [facility]PT4 Expected date:  Expected time:  Means of arrival:  Comments: 

## 2017-08-20 NOTE — ED Notes (Signed)
Patient given sandwich and drink, per request.

## 2017-08-20 NOTE — ED Triage Notes (Signed)
Pt presents with c/o anxiety and depression. Pt reports she has been using heroin and meth off and on and wants to be here for her kids. Pt is tearful in triage. Denies SI/HI.

## 2017-08-20 NOTE — ED Notes (Signed)
Writer went into room to discuss discharge instructions, but pt was not in there. Pt seen walking to waiting room.

## 2017-08-20 NOTE — ED Provider Notes (Addendum)
[facility]-EMERGENCY DEPT Provider Note   CSN: [id number] Arrival date & time: 08/20/17  1418     History   Chief Complaint Chief Complaint  Patient presents with  . Anxiety    HPI [name] is a 37 y.o. female with a PMHx of anxiety, depression, chronic back and pelvic pain, drug seeking behaviors, headaches, kidney stones, insomnia, and other conditions listed below, who presents to the ED requesting detox from drugs.  Patient states that she uses heroin and meth intermittently, she last used heroin yesterday and last used meth 2 days ago.  She states that she has anxiety and difficulty sleeping which have been issues for quite some time.  She denies SI, HI, AVH, or alcohol use.  She endorses being a cigarette smoker.  She has no medical complaints at this time.  She states that she was previously on Lexapro and alprazolam but she has not been on them" a while", however she states that when she was on them she was doing much better and is interested in getting back on the medications.  Her PCP is Dr. Gerda Diss and he was the one prescribing them to her.   The history is provided by the patient and medical records. No language interpreter was used.  Anxiety  Pertinent negatives include no chest pain, no abdominal pain and no shortness of breath.    Past Medical History:  Diagnosis Date  . Anxiety    takes Xanax daily as needed  . Chronic back pain    herniated disc  . Chronic pelvic pain in female   . Depression    takes Lexapro daily  . Drug-seeking behavior   . Headache    constant  . History of bronchitis    yrs ago  . History of kidney stones   . Insomnia    takes Elavil nightly  . Nocturia   . Seasonal allergies    Claritin and FLonase daily as needed    Patient Active Problem List   Diagnosis Date Noted  . Skin ulcer (HCC) 08/03/2016  . Cellulitis 08/03/2016  . Cellulitis of hand, left   . Hepatitis C antibody test positive 01/09/2015   . Abdominal pain 01/09/2015  . Constipation 01/09/2015  . GERD (gastroesophageal reflux disease) 01/09/2015  . Abnormal liver enzymes 12/10/2014  . Acute hepatitis C virus infection without hepatic coma 12/10/2014  . Insomnia 09/12/2014  . Postoperative pain 04/03/2014  . Thoracic spondylosis 04/03/2014  . Herniated nucleus pulposus, thoracic 03/28/2014  . Anxiety and depression 11/29/2013  . Bipolar disorder, unspecified (HCC) 08/14/2013  . Panic attacks 08/14/2013  . Chronic back pain 08/14/2013    Past Surgical History:  Procedure Laterality Date  . CHOLECYSTECTOMY    . I&D EXTREMITY Left 08/03/2016   Procedure: IRRIGATION AND DEBRIDEMENT LEFT HAND;  Surgeon: Dominica Severin, MD;  Location: [facility];  Service: Orthopedics;  Laterality: Left;  . THORACIC DISCECTOMY Right 03/28/2014   Procedure: RIGHT THORACIC ELEVEN-TWELVE MICRODICSECTOMY;  Surgeon: Tressie Stalker, MD;  Location: [facility];  Service: Neurosurgery;  Laterality: Right;  right  . TUBAL LIGATION       OB History    Gravida  3   Para  3   Term  3   Preterm      AB      Living        SAB      TAB      Ectopic      Multiple  Live Births               Home Medications    Prior to Admission medications   Medication Sig Start Date End Date Taking? Authorizing Provider  oxyCODONE (OXY IR/ROXICODONE) 5 MG immediate release tablet Take 1 tablet (5 mg total) by mouth every 6 (six) hours as needed for moderate pain or severe pain. 08/06/16   Glade Lloyd, MD    Family History Family History  Problem Relation Age of Onset  . Hypertension Mother   . Hypertension Father   . Hypertension Sister   . Stroke Other   . Cancer Other   . Colon cancer Neg Hx     Social History Social History   Tobacco Use  . Smoking status: Current Every Day Smoker    Packs/day: 0.50    Years: 0.00    Pack years: 0.00    Types: Cigarettes  . Smokeless tobacco: Never Used  Substance Use Topics  .  Alcohol use: No  . Drug use: Yes    Types: Marijuana     Allergies   Motrin [ibuprofen]; Hydrocodone; Penicillins; Ultram [tramadol]; and Aspirin   Review of Systems Review of Systems  Constitutional: Negative for chills and fever.  Respiratory: Negative for shortness of breath.   Cardiovascular: Negative for chest pain.  Gastrointestinal: Negative for abdominal pain, constipation, diarrhea, nausea and vomiting.  Genitourinary: Negative for dysuria and hematuria.  Musculoskeletal: Negative for arthralgias and myalgias.  Skin: Negative for color change.  Allergic/Immunologic: Negative for immunocompromised state.  Neurological: Negative for weakness and numbness.  Psychiatric/Behavioral: Positive for sleep disturbance. Negative for confusion, hallucinations and suicidal ideas. The patient is nervous/anxious.    All other systems reviewed and are negative for acute change except as noted in the HPI.    Physical Exam Updated Vital Signs BP (!) 135/103 (BP Location: Left Arm)   Pulse (!) 108   Temp 98.6 F (37 C) (Oral)   Resp 20   Ht 5\' 2"  (1.575 m)   Wt 72.6 kg   LMP 08/17/2017 (Approximate)   SpO2 100%   BMI 29.26 kg/m   Physical Exam  Constitutional: She is oriented to person, place, and time. Vital signs are normal. She appears well-developed and well-nourished.  Non-toxic appearance. No distress.  Afebrile, nontoxic, NAD but anxious appearing  HENT:  Head: Normocephalic and atraumatic.  Mouth/Throat: Oropharynx is clear and moist and mucous membranes are normal.  Eyes: Conjunctivae and EOM are normal. Right eye exhibits no discharge. Left eye exhibits no discharge.  Neck: Normal range of motion. Neck supple.  Cardiovascular: Regular rhythm, normal heart sounds and intact distal pulses. Tachycardia present. Exam reveals no gallop and no friction rub.  No murmur heard. Mildly tachycardic in the low 100s  Pulmonary/Chest: Effort normal and breath sounds normal. No  respiratory distress. She has no decreased breath sounds. She has no wheezes. She has no rhonchi. She has no rales.  Abdominal: Soft. Normal appearance and bowel sounds are normal. She exhibits no distension. There is no tenderness. There is no rigidity, no rebound, no guarding, no CVA tenderness, no tenderness at McBurney's point and negative Murphy's sign.  Musculoskeletal: Normal range of motion.  Neurological: She is alert and oriented to person, place, and time. She has normal strength. No sensory deficit.  Skin: Skin is warm, dry and intact. No rash noted.  Psychiatric: Her mood appears anxious. She is not actively hallucinating. She expresses no homicidal and no suicidal ideation. She expresses no  suicidal plans and no homicidal plans.  Anxious affect, but pleasant and cooperative. Denies SI, HI, or AVH, doesn't seem to be responding to internal stimuli.   Nursing note and vitals reviewed.    ED Treatments / Results  Labs (all labs ordered are listed, but only abnormal results are displayed) Labs Reviewed  RAPID URINE DRUG SCREEN, HOSP PERFORMED - Abnormal; Notable for the following components:      Result Value   Opiates   (*)    Value: Result not available. Reagent lot number recalled by manufacturer.   Cocaine POSITIVE (*)    Amphetamines POSITIVE (*)    All other components within normal limits    EKG None  Radiology No results found.  Procedures Procedures (including critical care time)  Medications Ordered in ED Medications - No data to display   Initial Impression / Assessment and Plan / ED Course  I have reviewed the triage vital signs and the nursing notes.  Pertinent labs & imaging results that were available during my care of the patient were reviewed by me and considered in my medical decision making (see chart for details).     37 y.o. female here requesting detox from heroin and meth, states she has anxiety and difficulty sleeping which are not new  issues. Was previously on lexapro and alprazolam, but hasn't been on them in a while. Denies SI/HI/AVH, no EtOH use. +Tobacco use, smoking cessation advised. Denies medical complaints. On exam, appears anxious, mildly tachycardic in the low 100s, but otherwise physical exam is benign. Discussed that we could consult peer support specialist to discuss options she has regarding facilities, and she's agreeable to this. Will consult peer support and reassess.   4:57 PM Peer support specialist Abigail RuizJohn reports that they can try to get her into a facility tonight if we get a UDS. Will obtain this and reassess shortly.   6:55 PM Unclear why UDS took so long, but finally resulted and shows +cocaine and +amphetamines, negative for other drugs. Spoke with [name] of Peer Support who will send it to the facility and see if she can get a bed at detox tonight. He will call me back ASAP once we have an answer.   8:05 PM [name] of Peer Support states that unfortunately the facility he was talking with cannot take her currently, cited issues with the fact that opiates don't result on our UDS results currently and the facility did not accept this UDS result; they also stated they were also very busy and couldn't confirm a bed for her right now, therefore Abigail RuizJohn gave the pt resources and advised to f/up with those resources. I have advised drug and smoking cessation, resources given for financial support and shelters in addition to detox programs, and also advised f/up with her PCP to get back on her meds and for ongoing management of her medical/psychiatric conditions. She does not appear to pose a threat to herself or others currently, no indication for further emergent work up or evaluation at this time. I explained the diagnosis and have given explicit precautions to return to the ER including for any other new or worsening symptoms. The patient understands and accepts the medical plan as it's been dictated and I have  answered their questions. Discharge instructions concerning home care and prescriptions have been given. The patient is STABLE and is discharged to home in good condition.    Final Clinical Impressions(s) / ED Diagnoses   Final diagnoses:  Polysubstance abuse (HCC)  Tobacco use  Anxiety  Insomnia, unspecified type    ED Discharge Orders    [address]         Kirbie Stodghill, [place], [place]PA-C 08/20/17 2007    Wynetta FinesMessick, Peter C, MD 08/21/17 [phone]

## 2017-08-20 NOTE — Patient Outreach (Signed)
ED Peer Support Specialist Patient Intake (Complete at intake & 30-60 Day Follow-up)  Name: [name]  MRN: [id number]  Age: 37 y.o.   Date of Admission: 08/20/2017  Intake: Initial Comments:      Primary Reason Admitted: Pt presents with c/o anxiety and depression. Pt reports she has been using heroin and meth off and on and wants to be here for her kids. Pt is tearful in triage. Denies SI/HI  Lab values: Alcohol/ETOH: Negative Positive UDS? Yes Amphetamines: No Barbiturates: No Benzodiazepines: No Cocaine: No Opiates: No Cannabinoids: No  Demographic information: Gender: Female Ethnicity: White Marital Status: Separated Insurance Status: Best boy (Work Neurosurgeon, Physicist, medical, etc.: Yes(Food stamps ) Lives with: Alone Living situation: Homeless  Reported Patient History: Patient reported health conditions: Anxiety disorders, Depression, Bipolar disorder Patient aware of HIV and hepatitis status: No  In past year, has patient visited ED for any reason? No  Number of ED visits:    Reason(s) for visit:    In past year, has patient been hospitalized for any reason? No  Number of hospitalizations:    Reason(s) for hospitalization:    In past year, has patient been arrested? Yes(Drugs )  Number of arrests: 2  Reason(s) for arrest: Drugs  In past year, has patient been incarcerated?    Number of incarcerations:    Reason(s) for incarceration:    In past year, has patient received medication-assisted treatment? No  In past year, patient received the following treatments:    In past year, has patient received any harm reduction services? No  Did this include any of the following?    In past year, has patient received care from a mental health provider for diagnosis other than SUD? No  In past year, is this first time patient has overdosed? No  Number of past overdoses:    In past year, is this first time  patient has been hospitalized for an overdose? No  Number of hospitalizations for overdose(s):    Is patient currently receiving treatment for a mental health diagnosis? No  Patient reports experiencing difficulty participating in SUD treatment: No    Most important reason(s) for this difficulty?    Has patient received prior services for treatment? No  In past, patient has received services from following agencies:    Plan of Care:  Suggested follow up at these agencies/treatment centers:    Other information: CPSS John and I met with Pt and was able to gain information to help understand what services Pt was seeking. CPSS John processed with Pt about what services maybe beneficial at this time. CPSS explained the steps that CPSS would like to assist Pt with an try to follow it to recovery and better quality of life. CPSS was able to get a consent form signed to be able to fax Pt to [facility].    Aaron Edelman [place], CPSS  08/20/2017 4:50 PM

## 2017-11-28 ENCOUNTER — Telehealth: Payer: Self-pay | Admitting: Family Medicine

## 2017-11-28 NOTE — Telephone Encounter (Signed)
cb# [id number]

## 2017-11-28 NOTE — Telephone Encounter (Signed)
Called pt and transferred her to the front to get appt with lindsey per pt tomorrow.

## 2017-11-28 NOTE — Telephone Encounter (Signed)
Will need o v lindsay or myself

## 2017-11-28 NOTE — Telephone Encounter (Signed)
Pt was in with daughter today. Every time patient is PMSin. Pt states she is bleeding heaving and having lots of blood clots. Pt states it is very painful. Pt states she is having to take lots of Tylenol. Some days patient feels light headed, dizzy with periods. Pt states she does get sick sometimes when she is about to have her cycle.

## 2017-11-30 ENCOUNTER — Ambulatory Visit: Payer: Self-pay | Admitting: Family Medicine

## 2017-12-09 ENCOUNTER — Encounter: Payer: Self-pay | Admitting: Family Medicine

## 2018-01-19 DIAGNOSIS — Z5181 Encounter for therapeutic drug level monitoring: Secondary | ICD-10-CM | POA: Diagnosis not present

## 2018-02-03 DIAGNOSIS — Z5181 Encounter for therapeutic drug level monitoring: Secondary | ICD-10-CM | POA: Diagnosis not present

## 2018-03-24 ENCOUNTER — Other Ambulatory Visit: Payer: Self-pay

## 2018-03-24 ENCOUNTER — Emergency Department (HOSPITAL_COMMUNITY): Payer: Medicaid Other

## 2018-03-24 ENCOUNTER — Encounter (HOSPITAL_COMMUNITY): Payer: Self-pay | Admitting: Emergency Medicine

## 2018-03-24 ENCOUNTER — Emergency Department (HOSPITAL_COMMUNITY)
Admission: EM | Admit: 2018-03-24 | Discharge: 2018-03-24 | Disposition: A | Discharge status: Home / Self Care | Payer: Medicaid Other | Attending: Emergency Medicine | Admitting: Emergency Medicine

## 2018-03-24 DIAGNOSIS — S6991XA Unspecified injury of right wrist, hand and finger(s), initial encounter: Secondary | ICD-10-CM | POA: Diagnosis not present

## 2018-03-24 DIAGNOSIS — S6992XA Unspecified injury of left wrist, hand and finger(s), initial encounter: Secondary | ICD-10-CM | POA: Diagnosis not present

## 2018-03-24 DIAGNOSIS — R109 Unspecified abdominal pain: Secondary | ICD-10-CM | POA: Diagnosis not present

## 2018-03-24 DIAGNOSIS — S299XXA Unspecified injury of thorax, initial encounter: Secondary | ICD-10-CM | POA: Diagnosis not present

## 2018-03-24 DIAGNOSIS — S62633A Displaced fracture of distal phalanx of left middle finger, initial encounter for closed fracture: Secondary | ICD-10-CM | POA: Diagnosis not present

## 2018-03-24 DIAGNOSIS — Y999 Unspecified external cause status: Secondary | ICD-10-CM | POA: Diagnosis not present

## 2018-03-24 DIAGNOSIS — Y939 Activity, unspecified: Secondary | ICD-10-CM | POA: Insufficient documentation

## 2018-03-24 DIAGNOSIS — Y92009 Unspecified place in unspecified non-institutional (private) residence as the place of occurrence of the external cause: Secondary | ICD-10-CM | POA: Insufficient documentation

## 2018-03-24 DIAGNOSIS — F17210 Nicotine dependence, cigarettes, uncomplicated: Secondary | ICD-10-CM | POA: Diagnosis not present

## 2018-03-24 DIAGNOSIS — S3991XA Unspecified injury of abdomen, initial encounter: Secondary | ICD-10-CM | POA: Diagnosis not present

## 2018-03-24 DIAGNOSIS — S61451A Open bite of right hand, initial encounter: Secondary | ICD-10-CM | POA: Diagnosis not present

## 2018-03-24 DIAGNOSIS — S0990XA Unspecified injury of head, initial encounter: Secondary | ICD-10-CM | POA: Insufficient documentation

## 2018-03-24 DIAGNOSIS — Z79899 Other long term (current) drug therapy: Secondary | ICD-10-CM | POA: Insufficient documentation

## 2018-03-24 DIAGNOSIS — S199XXA Unspecified injury of neck, initial encounter: Secondary | ICD-10-CM | POA: Diagnosis not present

## 2018-03-24 DIAGNOSIS — S62639A Displaced fracture of distal phalanx of unspecified finger, initial encounter for closed fracture: Secondary | ICD-10-CM

## 2018-03-24 DIAGNOSIS — S62632A Displaced fracture of distal phalanx of right middle finger, initial encounter for closed fracture: Secondary | ICD-10-CM | POA: Diagnosis not present

## 2018-03-24 DIAGNOSIS — R52 Pain, unspecified: Secondary | ICD-10-CM | POA: Diagnosis not present

## 2018-03-24 DIAGNOSIS — S4992XA Unspecified injury of left shoulder and upper arm, initial encounter: Secondary | ICD-10-CM | POA: Diagnosis not present

## 2018-03-24 DIAGNOSIS — S0993XA Unspecified injury of face, initial encounter: Secondary | ICD-10-CM | POA: Diagnosis not present

## 2018-03-24 DIAGNOSIS — S61452A Open bite of left hand, initial encounter: Secondary | ICD-10-CM | POA: Diagnosis not present

## 2018-03-24 DIAGNOSIS — W503XXA Accidental bite by another person, initial encounter: Secondary | ICD-10-CM

## 2018-03-24 LAB — COMPREHENSIVE METABOLIC PANEL
ALBUMIN: 3.4 g/dL — AB (ref 3.5–5.0)
ALT: 82 U/L — AB (ref 0–44)
AST: 44 U/L — AB (ref 15–41)
Alkaline Phosphatase: 51 U/L (ref 38–126)
Anion gap: 7 (ref 5–15)
BILIRUBIN TOTAL: 0.7 mg/dL (ref 0.3–1.2)
BUN: 13 mg/dL (ref 6–20)
CO2: 21 mmol/L — ABNORMAL LOW (ref 22–32)
CREATININE: 0.81 mg/dL (ref 0.44–1.00)
Calcium: 8.2 mg/dL — ABNORMAL LOW (ref 8.9–10.3)
Chloride: 106 mmol/L (ref 98–111)
GFR calc Af Amer: >60 mL/min (ref >60)
GFR calc non Af Amer: >60 mL/min (ref >60)
GLUCOSE: 82 mg/dL (ref 70–99)
POTASSIUM: 4 mmol/L (ref 3.5–5.1)
Sodium: 134 mmol/L — ABNORMAL LOW (ref 135–145)
TOTAL PROTEIN: 6.4 g/dL — AB (ref 6.5–8.1)

## 2018-03-24 LAB — CBC WITH DIFFERENTIAL/PLATELET
ABS IMMATURE GRANULOCYTES: 0.01 10*3/uL (ref 0.00–0.07)
BASOS ABS: 0 10*3/uL (ref 0.0–0.1)
Basophils Relative: 1 %
Eosinophils Absolute: 0.1 10*3/uL (ref 0.0–0.5)
Eosinophils Relative: 1 %
HEMATOCRIT: 39.8 % (ref 36.0–46.0)
HEMOGLOBIN: 12.6 g/dL (ref 12.0–15.0)
IMMATURE GRANULOCYTES: 0 %
LYMPHS ABS: 2.1 10*3/uL (ref 0.7–4.0)
LYMPHS PCT: 33 %
MCH: 29.2 pg (ref 26.0–34.0)
MCHC: 31.7 g/dL (ref 30.0–36.0)
MCV: 92.3 fL (ref 80.0–100.0)
MONOS PCT: 11 %
Monocytes Absolute: 0.7 10*3/uL (ref 0.1–1.0)
NEUTROS ABS: 3.3 10*3/uL (ref 1.7–7.7)
NEUTROS PCT: 54 %
NRBC: 0 % (ref 0.0–0.2)
Platelets: 289 10*3/uL (ref 150–400)
RBC: 4.31 MIL/uL (ref 3.87–5.11)
RDW: 15 % (ref 11.5–15.5)
WBC: 6.2 10*3/uL (ref 4.0–10.5)

## 2018-03-24 LAB — I-STAT BETA HCG BLOOD, ED (MC, WL, AP ONLY): I-stat hCG, quantitative: <5 m[IU]/mL (ref <5)

## 2018-03-24 MED ORDER — IOHEXOL 300 MG/ML  SOLN
100.0000 mL | Freq: Once | INTRAMUSCULAR | Status: AC | PRN
  Administered 2018-03-24: 100 mL via INTRAVENOUS

## 2018-03-24 MED ORDER — FENTANYL CITRATE (PF) 100 MCG/2ML IJ SOLN
50.0000 ug | Freq: Once | INTRAMUSCULAR | Status: AC
  Administered 2018-03-24: 50 ug via INTRAVENOUS
  Filled 2018-03-24: qty 2

## 2018-03-24 MED ORDER — OXYCODONE-ACETAMINOPHEN 5-325 MG PO TABS
1.0000 | ORAL_TABLET | Freq: Once | ORAL | Status: AC
  Administered 2018-03-24: 1 via ORAL
  Filled 2018-03-24: qty 1

## 2018-03-24 MED ORDER — AMOXICILLIN-POT CLAVULANATE 875-125 MG PO TABS: 1.0000 | ORAL_TABLET | Freq: Two times a day (BID) | ORAL | 0 refills | Status: DC

## 2018-03-24 MED ORDER — ONDANSETRON HCL 4 MG PO TABS: 4.0000 mg | ORAL_TABLET | Freq: Four times a day (QID) | ORAL | 0 refills | Status: DC | PRN

## 2018-03-24 MED ORDER — SODIUM CHLORIDE 0.9 % IV BOLUS
1000.0000 mL | Freq: Once | INTRAVENOUS | Status: AC
  Administered 2018-03-24: 1000 mL via INTRAVENOUS

## 2018-03-24 MED ORDER — OXYCODONE-ACETAMINOPHEN 5-325 MG PO TABS: 1.0000 | ORAL_TABLET | Freq: Four times a day (QID) | ORAL | 0 refills | Status: DC | PRN

## 2018-03-24 MED ORDER — AMOXICILLIN-POT CLAVULANATE 875-125 MG PO TABS
1.0000 | ORAL_TABLET | Freq: Once | ORAL | Status: AC
  Administered 2018-03-24: 1 via ORAL
  Filled 2018-03-24: qty 1

## 2018-03-24 NOTE — ED Notes (Signed)
Called lab to collect blood.

## 2018-03-24 NOTE — SANE Note (Signed)
Follow-up Phone Call  Patient gives verbal consent for a FNE/SANE follow-up phone call in 48-72 hours: DID NOT ASK THE PT. Patient's telephone number: [phone] (PT'S CELL W/ TEXTING; PT NOT SURE IF VM IS SET UP). Patient gives verbal consent to leave voicemail at the phone number listed above: DID NOT ASK THE PT. DO NOT CALL between the hours of: N/A   [facility] OFFICE CASE NUMBER:  [id number]  DET. Elige Radon  PT'S (& PT'S MOTHER'S) ADDRESS:  [address], [place], [place] [address]  PT'S MOTHER:  CAROLYN HEART  PT'S MOTHER'S CELL:  [phone] (W/ VM & TEXTING)
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
## 2018-03-24 NOTE — ED Notes (Signed)
[name]  SANE RN here to assess the patient

## 2018-03-24 NOTE — ED Notes (Signed)
Patient states that she is waiting on a ride.
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
## 2018-03-24 NOTE — ED Notes (Signed)
Patient undress in a gown, EDP at the bedside to access

## 2018-03-24 NOTE — SANE Note (Signed)
On 03/24/2018, at approximately 1975 hours, the SANE/FNE Teacher, music) consult was completed. The primary RN and physician have been notified. Please contact the SANE/FNE nurse on call (listed in Amion) with any further concerns.
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
## 2018-04-11 DIAGNOSIS — J029 Acute pharyngitis, unspecified: Secondary | ICD-10-CM | POA: Diagnosis not present

## 2018-04-11 DIAGNOSIS — R0989 Other specified symptoms and signs involving the circulatory and respiratory systems: Secondary | ICD-10-CM | POA: Diagnosis not present

## 2018-04-11 DIAGNOSIS — Z20828 Contact with and (suspected) exposure to other viral communicable diseases: Secondary | ICD-10-CM | POA: Diagnosis not present

## 2018-04-11 DIAGNOSIS — J36 Peritonsillar abscess: Secondary | ICD-10-CM | POA: Diagnosis not present

## 2018-04-11 DIAGNOSIS — H9201 Otalgia, right ear: Secondary | ICD-10-CM | POA: Diagnosis not present

## 2018-04-13 ENCOUNTER — Encounter (HOSPITAL_BASED_OUTPATIENT_CLINIC_OR_DEPARTMENT_OTHER): Payer: Self-pay

## 2018-04-13 ENCOUNTER — Emergency Department (HOSPITAL_BASED_OUTPATIENT_CLINIC_OR_DEPARTMENT_OTHER): Payer: Medicaid Other

## 2018-04-13 ENCOUNTER — Other Ambulatory Visit: Payer: Self-pay

## 2018-04-13 ENCOUNTER — Emergency Department (HOSPITAL_BASED_OUTPATIENT_CLINIC_OR_DEPARTMENT_OTHER)
Admission: EM | Admit: 2018-04-13 | Discharge: 2018-04-13 | Disposition: A | Discharge status: Home / Self Care | Payer: Medicaid Other | Attending: Emergency Medicine | Admitting: Emergency Medicine

## 2018-04-13 DIAGNOSIS — J029 Acute pharyngitis, unspecified: Secondary | ICD-10-CM | POA: Diagnosis present

## 2018-04-13 DIAGNOSIS — J36 Peritonsillar abscess: Secondary | ICD-10-CM

## 2018-04-13 DIAGNOSIS — F17210 Nicotine dependence, cigarettes, uncomplicated: Secondary | ICD-10-CM | POA: Insufficient documentation

## 2018-04-13 DIAGNOSIS — R070 Pain in throat: Secondary | ICD-10-CM | POA: Diagnosis not present

## 2018-04-13 LAB — COMPREHENSIVE METABOLIC PANEL
ALT: 48 U/L — ABNORMAL HIGH (ref 0–44)
AST: 33 U/L (ref 15–41)
Albumin: 3.6 g/dL (ref 3.5–5.0)
Alkaline Phosphatase: 62 U/L (ref 38–126)
Anion gap: 8 (ref 5–15)
BUN: 14 mg/dL (ref 6–20)
CO2: 24 mmol/L (ref 22–32)
Calcium: 9 mg/dL (ref 8.9–10.3)
Chloride: 104 mmol/L (ref 98–111)
Creatinine, Ser: 0.78 mg/dL (ref 0.44–1.00)
GFR calc Af Amer: >60 mL/min (ref >60)
GFR calc non Af Amer: >60 mL/min (ref >60)
Glucose, Bld: 89 mg/dL (ref 70–99)
Potassium: 3.9 mmol/L (ref 3.5–5.1)
Sodium: 136 mmol/L (ref 135–145)
Total Bilirubin: 0.2 mg/dL — ABNORMAL LOW (ref 0.3–1.2)
Total Protein: 8.2 g/dL — ABNORMAL HIGH (ref 6.5–8.1)

## 2018-04-13 LAB — CBC
HCT: 42.5 % (ref 36.0–46.0)
Hemoglobin: 13.3 g/dL (ref 12.0–15.0)
MCH: 28.5 pg (ref 26.0–34.0)
MCHC: 31.3 g/dL (ref 30.0–36.0)
MCV: 91 fL (ref 80.0–100.0)
Platelets: 469 10*3/uL — ABNORMAL HIGH (ref 150–400)
RBC: 4.67 MIL/uL (ref 3.87–5.11)
RDW: 14.6 % (ref 11.5–15.5)
WBC: 9.1 10*3/uL (ref 4.0–10.5)
nRBC: 0 % (ref 0.0–0.2)

## 2018-04-13 MED ORDER — METHYLPREDNISOLONE SODIUM SUCC 125 MG IJ SOLR
125.0000 mg | Freq: Once | INTRAMUSCULAR | Status: AC
  Administered 2018-04-13: 13:00:00 125 mg via INTRAVENOUS
  Filled 2018-04-13: qty 2

## 2018-04-13 MED ORDER — CLINDAMYCIN HCL 300 MG PO CAPS: 300.0000 mg | ORAL_CAPSULE | Freq: Three times a day (TID) | ORAL | 0 refills | Status: AC

## 2018-04-13 MED ORDER — SODIUM CHLORIDE 0.9 % IV BOLUS
1000.0000 mL | Freq: Once | INTRAVENOUS | Status: AC
  Administered 2018-04-13: 1000 mL via INTRAVENOUS

## 2018-04-13 MED ORDER — OXYCODONE HCL 5 MG PO TABS: 5.0000 mg | ORAL_TABLET | ORAL | 0 refills | Status: DC | PRN

## 2018-04-13 MED ORDER — PREDNISONE 20 MG PO TABS: 40.0000 mg | ORAL_TABLET | Freq: Every day | ORAL | 0 refills | Status: AC

## 2018-04-13 MED ORDER — CLINDAMYCIN PHOSPHATE 600 MG/50ML IV SOLN
600.0000 mg | Freq: Once | INTRAVENOUS | Status: AC
  Administered 2018-04-13: 600 mg via INTRAVENOUS
  Filled 2018-04-13: qty 50

## 2018-04-13 MED ORDER — IOHEXOL 300 MG/ML  SOLN
100.0000 mL | Freq: Once | INTRAMUSCULAR | Status: AC | PRN
  Administered 2018-04-13: 13:00:00 75 mL via INTRAVENOUS

## 2018-04-13 MED ORDER — FENTANYL CITRATE (PF) 100 MCG/2ML IJ SOLN
50.0000 ug | Freq: Once | INTRAMUSCULAR | Status: AC
  Administered 2018-04-13: 50 ug via INTRAVENOUS
  Filled 2018-04-13: qty 2

## 2018-04-13 MED ORDER — LIDOCAINE HCL URETHRAL/MUCOSAL 2 % EX GEL
1.0000 "application " | Freq: Once | CUTANEOUS | Status: AC
  Administered 2018-04-13: 1 via TOPICAL
  Filled 2018-04-13: qty 20

## 2018-04-13 NOTE — ED Notes (Signed)
Blanket provided. Patient medicated per order, see mar. Call light in reach.
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
## 2018-04-13 NOTE — Discharge Instructions (Addendum)
You were evaluated in the Emergency Department and after careful evaluation, we did not find any emergent condition requiring admission or further testing in the hospital.  Your symptoms today seem to be due to an abscess near the tonsil.  Please take the antibiotics as directed, with your next dose this evening.  Please take the steroids as directed, with your next dose tomorrow.  You can use the oxycodone medication as needed for pain if you are having trouble sleeping at night.  Please return to the Emergency Department if you experience any worsening of your condition.  We encourage you to follow up with a primary care provider.  Thank you for allowing [place] to be a part of your care.

## 2018-04-13 NOTE — ED Notes (Signed)
ED Provider at bedside. 

## 2018-04-13 NOTE — ED Provider Notes (Signed)
[facility] MRN:  [id number]  Arrival date & time: 04/13/18     Chief Complaint   Sore Throat   History of Present Illness   [name] is a 38 y.o. year-old female with a history of anxiety, depression presenting to the ED with chief complaint of sore throat.  3 days of worsening sore throat, began is mild, now severe and worse on the right side.  Patient also notes swelling sensation inside the mouth and neck, trouble swallowing, trouble opening the mouth fully.  Denies fever, no shortness of breath, no chest pain, no abdominal pain.  Denies IV drug use.  Sent here by urgent care for close evaluation.  Review of Systems  A complete 10 system review of systems was obtained and all systems are negative except as noted in the HPI and PMH.   Patient's Health History    Past Medical History:  Diagnosis Date  . Anxiety    takes Xanax daily as needed  . Chronic back pain    herniated disc  . Chronic pelvic pain in female   . Depression    takes Lexapro daily  . Drug-seeking behavior   . Headache    constant  . History of bronchitis    yrs ago  . History of kidney stones   . Insomnia    takes Elavil nightly  . Nocturia   . Seasonal allergies    Claritin and FLonase daily as needed    Past Surgical History:  Procedure Laterality Date  . CHOLECYSTECTOMY    . I&D EXTREMITY Left 08/03/2016   Procedure: IRRIGATION AND DEBRIDEMENT LEFT HAND;  Surgeon: Dominica Severin, MD;  Location: [facility];  Service: Orthopedics;  Laterality: Left;  . THORACIC DISCECTOMY Right 03/28/2014   Procedure: RIGHT THORACIC ELEVEN-TWELVE MICRODICSECTOMY;  Surgeon: Tressie Stalker, MD;  Location: [facility];  Service: Neurosurgery;  Laterality: Right;  right  . TUBAL LIGATION      Family History  Problem Relation Age of Onset  . Hypertension Mother   . Hypertension Father   . Hypertension Sister   . Stroke Other   . Cancer Other    . Colon cancer Neg Hx     Social History   Socioeconomic History  . Marital status: Legally Separated    Spouse name: Not on file  . Number of children: 3  . Years of education: Not on file  . Highest education level: Not on file  Occupational History  . Not on file  Social Needs  . Financial resource strain: Not on file  . Food insecurity:    Worry: Not on file    Inability: Not on file  . Transportation needs:    Medical: Not on file    Non-medical: Not on file  Tobacco Use  . Smoking status: Current Every Day Smoker    Packs/day: 0.50    Years: 0.00    Pack years: 0.00    Types: Cigarettes  . Smokeless tobacco: Never Used  Substance and Sexual Activity  . Alcohol use: Yes    Comment: occ  . Drug use: Not Currently    Types: Marijuana  . Sexual activity: Not on file  Lifestyle  . Physical activity:    Days per week: Not on file    Minutes per session: Not on file  . Stress: Not on file  Relationships  . Social connections:    Talks on phone: Not on file  Gets together: Not on file    Attends religious service: Not on file    Active member of club or organization: Not on file    Attends meetings of clubs or organizations: Not on file    Relationship status: Not on file  . Intimate partner violence:    Fear of current or ex partner: Not on file    Emotionally abused: Not on file    Physically abused: Not on file    Forced sexual activity: Not on file  Other Topics Concern  . Not on file  Social History Narrative  . Not on file     Physical Exam  Vital Signs and Nursing Notes reviewed Vitals:   04/13/18 1409 04/13/18 1412  BP:    Pulse:  82  Resp:    Temp:    SpO2: 100% 100%    CONSTITUTIONAL: Chronically ill-appearing, NAD NEURO:  Alert and oriented x 3, no focal deficits EYES:  eyes equal and reactive ENT/NECK:  no LAD, no JVD; moderate trismus on exam with evidence of peritonsillar edema and erythema on the right CARDIO: Tachycardic rate,  well-perfused, normal S1 and S2 PULM:  CTAB no wheezing or rhonchi GI/GU:  normal bowel sounds, non-distended, non-tender MSK/SPINE:  No gross deformities, no edema SKIN:  no rash, atraumatic PSYCH:  Appropriate speech and behavior  Diagnostic and Interventional Summary    Labs Reviewed  CBC - Abnormal; Notable for the following components:      Result Value   Platelets 469 (*)    All other components within normal limits  COMPREHENSIVE METABOLIC PANEL - Abnormal; Notable for the following components:   Total Protein 8.2 (*)    ALT 48 (*)    Total Bilirubin 0.2 (*)    All other components within normal limits    CT Soft Tissue Neck W Contrast  Final Result      Medications  clindamycin (CLEOCIN) IVPB 600 mg (0 mg Intravenous Stopped 04/13/18 1349)  methylPREDNISolone sodium succinate (SOLU-MEDROL) 125 mg/2 mL injection 125 mg (125 mg Intravenous Given 04/13/18 1251)  sodium chloride 0.9 % bolus 1,000 mL (0 mLs Intravenous Stopped 04/13/18 1349)  iohexol (OMNIPAQUE) 300 MG/ML solution 100 mL (75 mLs Intravenous Contrast Given 04/13/18 1316)  lidocaine (XYLOCAINE) 2 % jelly 1 application (1 application Topical Given 04/13/18 1408)  fentaNYL (SUBLIMAZE) injection 50 mcg (50 mcg Intravenous Given 04/13/18 1408)     .Marland Kitchen.Incision and Drainage Date/Time: 04/13/2018 2:45 PM Performed by: Sabas SousBero, Michael M, MD Authorized by: Sabas SousBero, Michael M, MD   Consent:    Consent obtained:  Verbal   Consent given by:  Patient   Risks discussed:  Bleeding, incomplete drainage and pain   Alternatives discussed:  No treatment Location:    Type:  Abscess   Size:  1-2cm   Location:  Mouth   Mouth location:  Peritonsillar Anesthesia (see MAR for exact dosages):    Anesthesia method:  Topical application   Topical anesthetic:  Lidocaine gel Procedure type:    Complexity:  Simple Procedure details:    Needle aspiration: yes     Needle size:  18 G   Drainage:  Bloody   Drainage amount:  Scant   Packing  materials:  None Post-procedure details:    Patient tolerance of procedure:  Tolerated well, no immediate complications   Critical Care  ED Course and Medical Decision Making  I have reviewed the triage vital signs and the nursing notes.  Pertinent labs & imaging results that  were available during my care of the patient were reviewed by me and considered in my medical decision making (see below for details).  Concern for peritonsillar abscess or other deeper space infection in this 38 year old female with trismus, trouble swallowing.  CTs pending.  Given the trismus and evident swelling will provide IV antibiotics and steroids while waiting.  CT reveals no retropharyngeal abscess but does confirm a right peritonsillar abscess.  Incision and drainage attempted as described above, very little material removed.  Incision and drainage attempt was limited by patient's inability to open mouth wide.  Still, patient has no airway concerns, no signs or symptoms of systemic infection, is appropriate for outpatient management with steroids and antibiotics and strict return precautions.  After the discussed management above, the patient was determined to be safe for discharge.  The patient was in agreement with this plan and all questions regarding their care were answered.  ED return precautions were discussed and the patient will return to the ED with any significant worsening of condition.  Elmer Sow. Pilar Plate, MD [facility] [email or web address]  Final Clinical Impressions(s) / ED Diagnoses     ICD-10-CM   1. Peritonsillar abscess J36     ED Discharge Orders         Ordered    predniSONE (DELTASONE) 20 MG tablet  Daily     04/13/18 1443    clindamycin (CLEOCIN) 300 MG capsule  3 times daily     04/13/18 1443    oxyCODONE (ROXICODONE) 5 MG immediate release tablet  Every 4 hours PRN     04/13/18 1443             Sabas Sous, MD 04/13/18  1446

## 2018-05-04 ENCOUNTER — Telehealth: Payer: Self-pay | Admitting: Family Medicine

## 2018-05-04 NOTE — Telephone Encounter (Signed)
Home health nurse Gita Kudo with [facility] called and wanted to speak with a nurse about patient's care.  Only info she would give me was that she needed "to report something".  [phone]

## 2018-05-04 NOTE — Telephone Encounter (Signed)
Nurse is reporting that they did a psychiatric assessment on patient and she scored really hight. Nurse states that patient is seeking help. Nurse states they are required to report to PCP when pt is severely depressed.

## 2018-05-05 ENCOUNTER — Encounter: Payer: Self-pay | Admitting: Family Medicine

## 2018-05-05 ENCOUNTER — Ambulatory Visit (INDEPENDENT_AMBULATORY_CARE_PROVIDER_SITE_OTHER): Payer: Medicaid Other | Admitting: Family Medicine

## 2018-05-05 ENCOUNTER — Other Ambulatory Visit: Payer: Self-pay

## 2018-05-05 VITALS — Wt 155.0 lb

## 2018-05-05 DIAGNOSIS — F410 Panic disorder [episodic paroxysmal anxiety] without agoraphobia: Secondary | ICD-10-CM | POA: Diagnosis not present

## 2018-05-05 DIAGNOSIS — F411 Generalized anxiety disorder: Secondary | ICD-10-CM | POA: Diagnosis not present

## 2018-05-05 DIAGNOSIS — F4310 Post-traumatic stress disorder, unspecified: Secondary | ICD-10-CM

## 2018-05-05 DIAGNOSIS — F321 Major depressive disorder, single episode, moderate: Secondary | ICD-10-CM

## 2018-05-05 MED ORDER — ESCITALOPRAM OXALATE 20 MG PO TABS: ORAL_TABLET | ORAL | 2 refills | Status: DC

## 2018-05-05 MED ORDER — ALPRAZOLAM 1 MG PO TABS: ORAL_TABLET | ORAL | 2 refills | Status: DC

## 2018-05-05 NOTE — Telephone Encounter (Signed)
Pt contacted and has virtual visit set up this afternoon

## 2018-05-05 NOTE — Telephone Encounter (Signed)
Ok contact pt and set up video visit
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
## 2018-05-23 DIAGNOSIS — F439 Reaction to severe stress, unspecified: Secondary | ICD-10-CM | POA: Diagnosis not present

## 2018-05-24 ENCOUNTER — Other Ambulatory Visit: Payer: Self-pay

## 2018-05-24 ENCOUNTER — Ambulatory Visit (INDEPENDENT_AMBULATORY_CARE_PROVIDER_SITE_OTHER): Payer: Medicaid Other | Admitting: Family Medicine

## 2018-05-24 DIAGNOSIS — F411 Generalized anxiety disorder: Secondary | ICD-10-CM | POA: Diagnosis not present

## 2018-05-24 DIAGNOSIS — F321 Major depressive disorder, single episode, moderate: Secondary | ICD-10-CM | POA: Diagnosis not present

## 2018-05-24 DIAGNOSIS — F4310 Post-traumatic stress disorder, unspecified: Secondary | ICD-10-CM

## 2018-05-24 DIAGNOSIS — F410 Panic disorder [episodic paroxysmal anxiety] without agoraphobia: Secondary | ICD-10-CM | POA: Diagnosis not present

## 2018-05-24 MED ORDER — BUPROPION HCL ER (SR) 150 MG PO TB12: ORAL_TABLET | ORAL | 3 refills | Status: DC

## 2018-05-24 MED ORDER — ALPRAZOLAM 1 MG PO TABS: ORAL_TABLET | ORAL | 2 refills | Status: DC
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
## 2018-07-20 ENCOUNTER — Other Ambulatory Visit: Payer: Self-pay | Admitting: Family Medicine

## 2018-07-20 NOTE — Telephone Encounter (Signed)
Ok plus four monhly ref

## 2018-08-15 ENCOUNTER — Ambulatory Visit (INDEPENDENT_AMBULATORY_CARE_PROVIDER_SITE_OTHER): Payer: Medicaid Other | Admitting: Family Medicine

## 2018-08-15 ENCOUNTER — Other Ambulatory Visit: Payer: Self-pay

## 2018-08-15 DIAGNOSIS — F321 Major depressive disorder, single episode, moderate: Secondary | ICD-10-CM | POA: Diagnosis not present

## 2018-08-15 DIAGNOSIS — F411 Generalized anxiety disorder: Secondary | ICD-10-CM | POA: Diagnosis not present

## 2018-08-15 DIAGNOSIS — F410 Panic disorder [episodic paroxysmal anxiety] without agoraphobia: Secondary | ICD-10-CM | POA: Diagnosis not present

## 2018-08-15 MED ORDER — ALPRAZOLAM 1 MG PO TABS: ORAL_TABLET | ORAL | 3 refills | Status: DC

## 2018-08-15 MED ORDER — BUPROPION HCL ER (SR) 150 MG PO TB12: ORAL_TABLET | ORAL | 5 refills | Status: DC

## 2018-08-15 MED ORDER — ESCITALOPRAM OXALATE 20 MG PO TABS: ORAL_TABLET | ORAL | 5 refills | Status: DC

## 2018-08-15 MED ORDER — ALPRAZOLAM 1 MG PO TABS: ORAL_TABLET | ORAL | 5 refills | Status: DC
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
## 2018-08-19 ENCOUNTER — Encounter: Payer: Self-pay | Admitting: Family Medicine

## 2019-01-15 ENCOUNTER — Telehealth: Payer: Self-pay | Admitting: Family Medicine

## 2019-01-15 ENCOUNTER — Other Ambulatory Visit: Payer: Self-pay | Admitting: *Deleted

## 2019-01-15 MED ORDER — ALPRAZOLAM 1 MG PO TABS: ORAL_TABLET | ORAL | 0 refills | Status: DC

## 2019-01-15 MED ORDER — ESCITALOPRAM OXALATE 20 MG PO TABS: ORAL_TABLET | ORAL | 0 refills | Status: DC

## 2019-01-15 MED ORDER — BUPROPION HCL ER (SR) 150 MG PO TB12: ORAL_TABLET | ORAL | 0 refills | Status: DC

## 2019-01-15 NOTE — Telephone Encounter (Signed)
One mo's worth rec phone visit three wks

## 2019-01-15 NOTE — Telephone Encounter (Signed)
Pt states she has not had that med filled in over one month and I  called and let the pharm know. Also let pt know her other meds requested were sent over.
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
## 2019-01-15 NOTE — Telephone Encounter (Signed)
Left message to return call -  jessie from pharm called and states her insurance rejected wellbutrin because it was just filled. I left pt a message to return call to see if she needed that refilled.

## 2019-01-15 NOTE — Telephone Encounter (Signed)
Lexapro and wellbutrin sent  in. Xanax printed and await dr steve's signature then will fax and call pt

## 2019-02-05 ENCOUNTER — Encounter: Payer: Self-pay | Admitting: Family Medicine

## 2019-02-15 ENCOUNTER — Other Ambulatory Visit: Payer: Self-pay | Admitting: Family Medicine

## 2019-02-16 NOTE — Telephone Encounter (Signed)
Last med check up 08/15/18

## 2019-02-23 NOTE — Telephone Encounter (Signed)
Ok one mo 

## 2019-02-23 NOTE — Telephone Encounter (Signed)
Pt has med check scheduled 3/1 and would like a call once medicine is sent to pharmacy.

## 2019-03-05 ENCOUNTER — Other Ambulatory Visit: Payer: Self-pay

## 2019-03-05 ENCOUNTER — Ambulatory Visit: Payer: Medicaid Other | Admitting: Family Medicine

## 2019-03-05 NOTE — Progress Notes (Unsigned)
   Subjective:    Patient ID: [name], female    DOB: [birth year], 39 y.o.   MRN: [id number]  HPI    Review of Systems     Objective:   Physical Exam        Assessment & Plan:

## 2019-03-06 ENCOUNTER — Encounter: Payer: Self-pay | Admitting: Family Medicine

## 2019-03-20 ENCOUNTER — Encounter: Payer: Self-pay | Admitting: Family Medicine

## 2019-03-20 ENCOUNTER — Other Ambulatory Visit: Payer: Self-pay

## 2019-03-20 ENCOUNTER — Ambulatory Visit: Payer: Medicaid Other | Admitting: Family Medicine

## 2019-03-21 ENCOUNTER — Ambulatory Visit (INDEPENDENT_AMBULATORY_CARE_PROVIDER_SITE_OTHER): Payer: Medicaid Other | Admitting: Family Medicine

## 2019-03-21 DIAGNOSIS — F411 Generalized anxiety disorder: Secondary | ICD-10-CM | POA: Diagnosis not present

## 2019-03-21 DIAGNOSIS — F4310 Post-traumatic stress disorder, unspecified: Secondary | ICD-10-CM

## 2019-03-21 DIAGNOSIS — F410 Panic disorder [episodic paroxysmal anxiety] without agoraphobia: Secondary | ICD-10-CM

## 2019-03-21 DIAGNOSIS — F321 Major depressive disorder, single episode, moderate: Secondary | ICD-10-CM | POA: Diagnosis not present

## 2019-03-21 MED ORDER — BUPROPION HCL ER (SR) 150 MG PO TB12: ORAL_TABLET | ORAL | 5 refills | Status: DC

## 2019-03-21 MED ORDER — ALPRAZOLAM 1 MG PO TABS: 1.0000 mg | ORAL_TABLET | Freq: Two times a day (BID) | ORAL | 5 refills | Status: DC

## 2019-03-21 MED ORDER — ESCITALOPRAM OXALATE 20 MG PO TABS: 20.0000 mg | ORAL_TABLET | Freq: Every morning | ORAL | 5 refills | Status: DC
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
## 2019-03-26 ENCOUNTER — Other Ambulatory Visit: Payer: Self-pay

## 2019-03-26 ENCOUNTER — Emergency Department (HOSPITAL_COMMUNITY)
Admission: EM | Admit: 2019-03-26 | Discharge: 2019-03-26 | Disposition: A | Discharge status: Home / Self Care | Payer: Medicaid Other | Attending: Emergency Medicine | Admitting: Emergency Medicine

## 2019-03-26 ENCOUNTER — Encounter (HOSPITAL_COMMUNITY): Payer: Self-pay

## 2019-03-26 DIAGNOSIS — T424X4A Poisoning by benzodiazepines, undetermined, initial encounter: Secondary | ICD-10-CM | POA: Diagnosis present

## 2019-03-26 DIAGNOSIS — R400 Somnolence: Secondary | ICD-10-CM | POA: Diagnosis not present

## 2019-03-26 DIAGNOSIS — R0902 Hypoxemia: Secondary | ICD-10-CM | POA: Diagnosis not present

## 2019-03-26 DIAGNOSIS — T887XXA Unspecified adverse effect of drug or medicament, initial encounter: Secondary | ICD-10-CM | POA: Diagnosis not present

## 2019-03-26 DIAGNOSIS — R457 State of emotional shock and stress, unspecified: Secondary | ICD-10-CM | POA: Diagnosis not present

## 2019-03-26 DIAGNOSIS — T424X1A Poisoning by benzodiazepines, accidental (unintentional), initial encounter: Secondary | ICD-10-CM | POA: Diagnosis not present

## 2019-03-26 DIAGNOSIS — Z79899 Other long term (current) drug therapy: Secondary | ICD-10-CM | POA: Diagnosis not present

## 2019-03-26 DIAGNOSIS — Z9049 Acquired absence of other specified parts of digestive tract: Secondary | ICD-10-CM | POA: Insufficient documentation

## 2019-03-26 DIAGNOSIS — R9431 Abnormal electrocardiogram [ECG] [EKG]: Secondary | ICD-10-CM | POA: Diagnosis not present

## 2019-03-26 LAB — CBC WITH DIFFERENTIAL/PLATELET
Abs Immature Granulocytes: 0.01 10*3/uL (ref 0.00–0.07)
Basophils Absolute: 0.1 10*3/uL (ref 0.0–0.1)
Basophils Relative: 1 %
Eosinophils Absolute: 0.3 10*3/uL (ref 0.0–0.5)
Eosinophils Relative: 4 %
HCT: 41.5 % (ref 36.0–46.0)
Hemoglobin: 12.4 g/dL (ref 12.0–15.0)
Immature Granulocytes: 0 %
Lymphocytes Relative: 37 %
Lymphs Abs: 2.7 10*3/uL (ref 0.7–4.0)
MCH: 29 pg (ref 26.0–34.0)
MCHC: 29.9 g/dL — ABNORMAL LOW (ref 30.0–36.0)
MCV: 97.2 fL (ref 80.0–100.0)
Monocytes Absolute: 0.7 10*3/uL (ref 0.1–1.0)
Monocytes Relative: 10 %
Neutro Abs: 3.5 10*3/uL (ref 1.7–7.7)
Neutrophils Relative %: 48 %
Platelets: 287 10*3/uL (ref 150–400)
RBC: 4.27 MIL/uL (ref 3.87–5.11)
RDW: 13.4 % (ref 11.5–15.5)
WBC: 7.2 10*3/uL (ref 4.0–10.5)
nRBC: 0 % (ref 0.0–0.2)

## 2019-03-26 LAB — COMPREHENSIVE METABOLIC PANEL
ALT: 52 U/L — ABNORMAL HIGH (ref 0–44)
AST: 35 U/L (ref 15–41)
Albumin: 3.7 g/dL (ref 3.5–5.0)
Alkaline Phosphatase: 55 U/L (ref 38–126)
Anion gap: 6 (ref 5–15)
BUN: 14 mg/dL (ref 6–20)
CO2: 31 mmol/L (ref 22–32)
Calcium: 9 mg/dL (ref 8.9–10.3)
Chloride: 98 mmol/L (ref 98–111)
Creatinine, Ser: 0.91 mg/dL (ref 0.44–1.00)
GFR calc Af Amer: >60 mL/min (ref >60)
GFR calc non Af Amer: >60 mL/min (ref >60)
Glucose, Bld: 86 mg/dL (ref 70–99)
Potassium: 3.9 mmol/L (ref 3.5–5.1)
Sodium: 135 mmol/L (ref 135–145)
Total Bilirubin: 0.3 mg/dL (ref 0.3–1.2)
Total Protein: 7 g/dL (ref 6.5–8.1)

## 2019-03-26 LAB — ETHANOL: Alcohol, Ethyl (B): <10 mg/dL (ref <10)

## 2019-03-26 MED ORDER — SODIUM CHLORIDE 0.9 % IV BOLUS
1000.0000 mL | Freq: Once | INTRAVENOUS | Status: AC
  Administered 2019-03-26: 1000 mL via INTRAVENOUS
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
## 2019-03-26 NOTE — ED Notes (Signed)
Hand grips very strong bilateral, follows commands and moves lower extremities well.

## 2019-03-26 NOTE — Discharge Instructions (Addendum)
Take your medications only as directed.    Follow up with outpatient resources for substance abuse as provided in this discharge summary.

## 2019-03-26 NOTE — ED Provider Notes (Signed)
[facility] EMERGENCY DEPARTMENT Provider Note   CSN: [id number] Arrival date & time: 03/26/19  1600     History Chief Complaint  Patient presents with  . Drug Overdose    [name] is a 39 y.o. female.  Patient is a 39 year old female with history of anxiety, chronic back pain, chronic pelvic pain, kidney stones.  She is brought by EMS for evaluation of possible overdose.  From what I am told, patient told paramedics she took 3 mg of Xanax at approximately 3 this afternoon.  EMS was evaluating the patient's boyfriend who was also brought to the ER by EMS for an overdose.  Patient apparently became progressively more slurred and somnolent while they were on scene tending to her boyfriend.  Patient unable to add additional history secondary to somnolence.  The history is provided by the patient.  Drug Overdose This is a new problem. The current episode started 1 to 2 hours ago. The problem occurs constantly. The problem has been gradually worsening. Nothing aggravates the symptoms. Nothing relieves the symptoms.       Past Medical History:  Diagnosis Date  . Anxiety    takes Xanax daily as needed  . Chronic back pain    herniated disc  . Chronic pelvic pain in female   . Depression    takes Lexapro daily  . Drug-seeking behavior   . Headache    constant  . History of bronchitis    yrs ago  . History of kidney stones   . Insomnia    takes Elavil nightly  . Nocturia   . Seasonal allergies    Claritin and FLonase daily as needed    Patient Active Problem List   Diagnosis Date Noted  . Skin ulcer ([place]) 08/03/2016  . Cellulitis 08/03/2016  . Cellulitis of hand, left   . Hepatitis C antibody test positive 01/09/2015  . Abdominal pain 01/09/2015  . Constipation 01/09/2015  . GERD (gastroesophageal reflux disease) 01/09/2015  . Abnormal liver enzymes 12/10/2014  . Acute hepatitis C virus infection without hepatic coma 12/10/2014  . Insomnia 09/12/2014  .  Postoperative pain 04/03/2014  . Thoracic spondylosis 04/03/2014  . Herniated nucleus pulposus, thoracic 03/28/2014  . Anxiety and depression 11/29/2013  . Bipolar disorder, unspecified ([place]) 08/14/2013  . Panic attacks 08/14/2013  . Chronic back pain 08/14/2013    Past Surgical History:  Procedure Laterality Date  . CHOLECYSTECTOMY    . I & D EXTREMITY Left 08/03/2016   Procedure: IRRIGATION AND DEBRIDEMENT LEFT HAND;  Surgeon: Roseanne Kaufman, MD;  Location: [facility];  Service: Orthopedics;  Laterality: Left;  . THORACIC DISCECTOMY Right 03/28/2014   Procedure: RIGHT THORACIC ELEVEN-TWELVE MICRODICSECTOMY;  Surgeon: Newman Pies, MD;  Location: [place] [facility];  Service: Neurosurgery;  Laterality: Right;  right  . TUBAL LIGATION       OB History    Gravida  3   Para  3   Term  3   Preterm      AB      Living  3     SAB      TAB      Ectopic      Multiple      Live Births              Family History  Problem Relation Age of Onset  . Hypertension Mother   . Hypertension Father   . Hypertension Sister   . Stroke Other   . Cancer Other   .  Colon cancer Neg Hx     Social History   Tobacco Use  . Smoking status: Current Every Day Smoker    Packs/day: 0.50    Years: 0.00    Pack years: 0.00    Types: Cigarettes  . Smokeless tobacco: Never Used  Substance Use Topics  . Alcohol use: Yes    Comment: occ  . Drug use: Not Currently    Types: Marijuana    Home Medications Prior to Admission medications   Medication Sig Start Date End Date Taking? Authorizing Provider  ALPRAZolam Prudy Feeler) 1 MG tablet Take 1 tablet (1 mg total) by mouth 2 (two) times daily. 03/21/19   Merlyn Albert, MD  buPROPion [facility] SR) 150 MG 12 hr tablet TAKE ONE TABLET BY MOUTH TWICE DAILY 03/21/19   Merlyn Albert, MD  escitalopram (LEXAPRO) 20 MG tablet Take 1 tablet (20 mg total) by mouth every morning. 03/21/19   Merlyn Albert, MD    Allergies    Motrin  [ibuprofen], Hydrocodone, Penicillins, Ultram [tramadol], and Aspirin  Review of Systems   Review of Systems  Unable to perform ROS: Acuity of condition    Physical Exam Updated Vital Signs BP 101/75 (BP Location: Left Arm)   Pulse 81   Temp 98.6 F (37 C) (Oral)   Resp 14   Ht 5\' 2"  (1.575 m)   Wt 70.3 kg   SpO2 100%   BMI 28.35 kg/m   Physical Exam Vitals and nursing note reviewed.  Constitutional:      General: She is not in acute distress.    Appearance: She is well-developed. She is not diaphoretic.     Comments: Patient is somnolent but arousable.  She appears groggy and under the influence.    HENT:     Head: Normocephalic and atraumatic.  Eyes:     Comments: Pupils 1 mm and reactive.  Cardiovascular:     Rate and Rhythm: Normal rate and regular rhythm.     Heart sounds: No murmur. No friction rub. No gallop.   Pulmonary:     Effort: Pulmonary effort is normal. No respiratory distress.     Breath sounds: Normal breath sounds. No wheezing.  Abdominal:     General: Bowel sounds are normal. There is no distension.     Palpations: Abdomen is soft.     Tenderness: There is no abdominal tenderness.  Musculoskeletal:        General: Normal range of motion.     Cervical back: Normal range of motion and neck supple.  Skin:    General: Skin is warm and dry.  Neurological:     Comments: Patient is somnolent, but arousable.  She attempts to speak, but speech is slurred.  She has been seen moving all extremities.  Neurologic exam limited secondary to patient's level of intoxication.     ED Results / Procedures / Treatments   Labs (all labs ordered are listed, but only abnormal results are displayed) Labs Reviewed - No data to display  EKG EKG Interpretation  Date/Time:  Monday March 26 2019 16:13:23 EDT Ventricular Rate:  81 PR Interval:    QRS Duration: 98 QT Interval:  383 QTC Calculation: 445 R Axis:   66 Text Interpretation: Sinus rhythm Baseline wander  in lead(s) V3 Normal ECG Confirmed by [id number] (Geoffery Lyons) on 03/26/2019 4:28:27 PM   Radiology No results found.  Procedures Procedures (including critical care time)  Medications Ordered in ED Medications - No data to  display  ED Course  I have reviewed the triage vital signs and the nursing notes.  Pertinent labs & imaging results that were available during my care of the patient were reviewed by me and considered in my medical decision making (see chart for details).    MDM Rules/Calculators/A&P  Patient brought here by EMS after taking an additional Xanax.  Patient states she was "stressed out" after witnessing her significant other overdose.  EMS found her initially alert and talking, but became somnolent shortly thereafter.  She was observed here in the ER for several hours and is now awake and alert.  Her vital signs are stable and laboratory studies are unremarkable.  I have had a conversation regarding whether or not this was an intentional act.  Patient denies to me that she was attempting to hurt herself.  She only took these medications because she reports being "stressed out".  She does admit to me that she has a problem with drugs and would like to seek help.  She denies to me that she is having any homicidal or suicidal ideation.  At this point, I feel as though discharge is appropriate.  Patient advised to take her medications as directed and will be given resources for outpatient substance abuse treatment.  Final Clinical Impression(s) / ED Diagnoses Final diagnoses:  None    Rx / DC Orders ED Discharge Orders    None       Geoffery Lyons, MD 03/26/19 [phone]

## 2019-03-27 ENCOUNTER — Other Ambulatory Visit: Payer: Self-pay

## 2019-03-27 ENCOUNTER — Emergency Department
Admission: EM | Admit: 2019-03-27 | Discharge: 2019-03-29 | Disposition: A | Discharge status: Other Acute Inpatient Hospital | Payer: Medicaid Other | Attending: Emergency Medicine | Admitting: Emergency Medicine

## 2019-03-27 DIAGNOSIS — Z79899 Other long term (current) drug therapy: Secondary | ICD-10-CM | POA: Insufficient documentation

## 2019-03-27 DIAGNOSIS — Z20822 Contact with and (suspected) exposure to covid-19: Secondary | ICD-10-CM | POA: Diagnosis not present

## 2019-03-27 DIAGNOSIS — F17210 Nicotine dependence, cigarettes, uncomplicated: Secondary | ICD-10-CM | POA: Diagnosis not present

## 2019-03-27 DIAGNOSIS — Z9049 Acquired absence of other specified parts of digestive tract: Secondary | ICD-10-CM | POA: Insufficient documentation

## 2019-03-27 DIAGNOSIS — F1120 Opioid dependence, uncomplicated: Secondary | ICD-10-CM | POA: Diagnosis present

## 2019-03-27 DIAGNOSIS — F1910 Other psychoactive substance abuse, uncomplicated: Secondary | ICD-10-CM | POA: Diagnosis not present

## 2019-03-27 LAB — COMPREHENSIVE METABOLIC PANEL
ALT: 57 U/L — ABNORMAL HIGH (ref 0–44)
AST: 47 U/L — ABNORMAL HIGH (ref 15–41)
Albumin: 3.4 g/dL — ABNORMAL LOW (ref 3.5–5.0)
Alkaline Phosphatase: 51 U/L (ref 38–126)
Anion gap: 9 (ref 5–15)
BUN: 8 mg/dL (ref 6–20)
CO2: 25 mmol/L (ref 22–32)
Calcium: 8.7 mg/dL — ABNORMAL LOW (ref 8.9–10.3)
Chloride: 105 mmol/L (ref 98–111)
Creatinine, Ser: 0.73 mg/dL (ref 0.44–1.00)
GFR calc Af Amer: >60 mL/min (ref >60)
GFR calc non Af Amer: >60 mL/min (ref >60)
Glucose, Bld: 132 mg/dL — ABNORMAL HIGH (ref 70–99)
Potassium: 3.5 mmol/L (ref 3.5–5.1)
Sodium: 139 mmol/L (ref 135–145)
Total Bilirubin: 0.6 mg/dL (ref 0.3–1.2)
Total Protein: 6.5 g/dL (ref 6.5–8.1)

## 2019-03-27 LAB — CBC
HCT: 41.9 % (ref 36.0–46.0)
Hemoglobin: 12.9 g/dL (ref 12.0–15.0)
MCH: 29 pg (ref 26.0–34.0)
MCHC: 30.8 g/dL (ref 30.0–36.0)
MCV: 94.2 fL (ref 80.0–100.0)
Platelets: 262 10*3/uL (ref 150–400)
RBC: 4.45 MIL/uL (ref 3.87–5.11)
RDW: 13.2 % (ref 11.5–15.5)
WBC: 5.8 10*3/uL (ref 4.0–10.5)
nRBC: 0 % (ref 0.0–0.2)

## 2019-03-27 LAB — URINE DRUG SCREEN, QUALITATIVE (ARMC ONLY)
Amphetamines, Ur Screen: POSITIVE — AB
Barbiturates, Ur Screen: NOT DETECTED
Benzodiazepine, Ur Scrn: POSITIVE — AB
Cannabinoid 50 Ng, Ur ~~LOC~~: POSITIVE — AB
Cocaine Metabolite,Ur ~~LOC~~: NOT DETECTED
MDMA (Ecstasy)Ur Screen: NOT DETECTED
Methadone Scn, Ur: NOT DETECTED
Opiate, Ur Screen: NOT DETECTED
Phencyclidine (PCP) Ur S: NOT DETECTED
Tricyclic, Ur Screen: NOT DETECTED

## 2019-03-27 LAB — PREGNANCY, URINE: Preg Test, Ur: NEGATIVE

## 2019-03-27 LAB — ETHANOL: Alcohol, Ethyl (B): <10 mg/dL (ref <10)

## 2019-03-27 MED ORDER — ACETAMINOPHEN 325 MG PO TABS
650.0000 mg | ORAL_TABLET | Freq: Once | ORAL | Status: AC
  Administered 2019-03-27: 650 mg via ORAL
  Filled 2019-03-27: qty 2

## 2019-03-27 MED ORDER — ONDANSETRON 4 MG PO TBDP
4.0000 mg | ORAL_TABLET | Freq: Once | ORAL | Status: AC
  Administered 2019-03-27: 4 mg via ORAL
  Filled 2019-03-27: qty 1

## 2019-03-27 NOTE — ED Notes (Signed)
Pink sweat shirt White/cream bra  Blue tank top Jeans Grey and blue socks  Pink black and white tennis shoes   3 rings 1 braclet  1hair tie  1 silver colored necklace with charms  Lighter   Belongings given to quad RN

## 2019-03-27 NOTE — ED Triage Notes (Signed)
Pt here via POV from home with c/o wanting to detox. Pt states she was seen yesterday at [facility] after possibly OD on xanax. Pt states it was fent and heroin.  Pt states she is here for detox and wants to get help. Pt states she doesn't want to be here for a few days and then sent home. The patient states she wants help period.  Pt states she last used fent, heroin and meth through snorting. Pt denies any alcohol use. Pt states she takes Wellbutrin, xanax and Zoloft.  Pt calm and cooperative

## 2019-03-27 NOTE — ED Notes (Signed)
Pt. Transferred to BHU from ED to room 1 after screening for contraband. Report to include Situation, Background, Assessment and Recommendations from Kim Summers RN. Pt. Oriented to unit including Q15 minute rounds as well as the security cameras for their protection. Patient is alert and oriented, warm and dry in no acute distress. Patient denies SI, HI, and AVH. Pt. Encouraged to let me know if needs arise. 

## 2019-03-27 NOTE — ED Notes (Signed)
Pt denies SI, HI

## 2019-03-27 NOTE — Discharge Instructions (Signed)
Your liver function tests are slightly elevated and should be followed up with your primary doctor

## 2019-03-27 NOTE — ED Notes (Signed)

## 2019-03-27 NOTE — ED Notes (Signed)
Pt. Alert and oriented, warm and dry, in no distress. Pt. Denies SI, HI, and AVH. Pt. Encouraged to let nursing staff know of any concerns or needs. 

## 2019-03-27 NOTE — ED Notes (Signed)
Food tray was given with juice. 

## 2019-03-27 NOTE — ED Notes (Signed)
Hourly rounding reveals patient sleeping in room. No complaints, stable, in no acute distress. Q15 minute rounds and monitoring via Security Cameras to continue. 

## 2019-03-27 NOTE — ED Provider Notes (Signed)
[facility] Emergency Department Provider Note  ____________________________________________   First MD Initiated Contact with Patient 03/27/19 1753     (approximate)  I have reviewed the triage vital signs and the nursing notes.   HISTORY  Chief Complaint detox    HPI [name] is a 39 y.o. female with anxiety, chronic back pain, chronic pelvic pain who comes in for detox.  To note patient was seen yesterday over at [facility] long for concerns of taking 3 mg of Xanax.  At this time it appears that it was not intentional and that she was just stressed out.  Patient states that she wants to detox from meth and heroin.  States that she last used yesterday.  She states that now she has a mild headache as well as some nausea.  The symptoms have been since she stopped using, constant, nothing makes it better, nothing makes it worse.  Although patient states that she is feeling hungry and wants to eat.  Patient states that she uses drugs recreationally and denies that they have been with an attempt to hurt herself           Past Medical History:  Diagnosis Date  . Anxiety    takes Xanax daily as needed  . Chronic back pain    herniated disc  . Chronic pelvic pain in female   . Depression    takes Lexapro daily  . Drug-seeking behavior   . Headache    constant  . History of bronchitis    yrs ago  . History of kidney stones   . Insomnia    takes Elavil nightly  . Nocturia   . Seasonal allergies    Claritin and FLonase daily as needed    Patient Active Problem List   Diagnosis Date Noted  . Skin ulcer ([facility]) 08/03/2016  . Cellulitis 08/03/2016  . Cellulitis of hand, left   . Hepatitis C antibody test positive 01/09/2015  . Abdominal pain 01/09/2015  . Constipation 01/09/2015  . GERD (gastroesophageal reflux disease) 01/09/2015  . Abnormal liver enzymes 12/10/2014  . Acute hepatitis C virus infection without hepatic coma 12/10/2014  .  Insomnia 09/12/2014  . Postoperative pain 04/03/2014  . Thoracic spondylosis 04/03/2014  . Herniated nucleus pulposus, thoracic 03/28/2014  . Anxiety and depression 11/29/2013  . Bipolar disorder, unspecified ([facility]) 08/14/2013  . Panic attacks 08/14/2013  . Chronic back pain 08/14/2013    Past Surgical History:  Procedure Laterality Date  . CHOLECYSTECTOMY    . I & D EXTREMITY Left 08/03/2016   Procedure: IRRIGATION AND DEBRIDEMENT LEFT HAND;  Surgeon: Dominica Severin, MD;  Location: [facility];  Service: Orthopedics;  Laterality: Left;  . THORACIC DISCECTOMY Right 03/28/2014   Procedure: RIGHT THORACIC ELEVEN-TWELVE MICRODICSECTOMY;  Surgeon: Tressie Stalker, MD;  Location: [facility]UR[facility];  Service: Neurosurgery;  Laterality: Right;  right  . TUBAL LIGATION      Prior to Admission medications   Medication Sig Start Date End Date Taking? Authorizing Provider  ALPRAZolam Prudy Feeler) 1 MG tablet Take 1 tablet (1 mg total) by mouth 2 (two) times daily. 03/21/19   Merlyn Albert, MD  buPROPion [facility] SR) 150 MG 12 hr tablet TAKE ONE TABLET BY MOUTH TWICE DAILY 03/21/19   Merlyn Albert, MD  escitalopram (LEXAPRO) 20 MG tablet Take 1 tablet (20 mg total) by mouth every morning. 03/21/19   Merlyn Albert, MD    Allergies Motrin [ibuprofen], Hydrocodone, Penicillins, Ultram [tramadol], and  Aspirin  Family History  Problem Relation Age of Onset  . Hypertension Mother   . Hypertension Father   . Hypertension Sister   . Stroke Other   . Cancer Other   . Colon cancer Neg Hx     Social History Social History   Tobacco Use  . Smoking status: Current Every Day Smoker    Packs/day: 0.50    Years: 0.00    Pack years: 0.00    Types: Cigarettes  . Smokeless tobacco: Never Used  Substance Use Topics  . Alcohol use: Yes    Comment: occ  . Drug use: Yes    Types: Marijuana, Methamphetamines      Review of Systems Constitutional: No fever/chills Eyes: No visual changes. ENT: No  sore throat. Cardiovascular: Denies chest pain. Respiratory: Denies shortness of breath. Gastrointestinal: No abdominal pain.  Positive nausea, no vomiting.  No diarrhea.  No constipation. Genitourinary: Negative for dysuria. Musculoskeletal: Negative for back pain. Skin: Negative for rash. Neurological: Positive headaches, no focal weakness or numbness. Psych: No SI positive drug use All other ROS negative ____________________________________________   PHYSICAL EXAM:  VITAL SIGNS: ED Triage Vitals  Enc Vitals Group     BP 03/27/19 1707 132/86     Pulse Rate 03/27/19 1707 82     Resp 03/27/19 1707 18     Temp 03/27/19 1707 98.4 F (36.9 C)     Temp Source 03/27/19 1707 Oral     SpO2 03/27/19 1707 100 %     Weight 03/27/19 1708 160 lb (72.6 kg)     Height 03/27/19 1708 5\' 3"  (1.6 m)     Head Circumference --      Peak Flow --      Pain Score 03/27/19 1718 0     Pain Loc --      Pain Edu? --      Excl. in GC? --     Constitutional: Alert and oriented. Well appearing and in no acute distress. Eyes: Conjunctivae are normal. EOMI. Head: Atraumatic. Nose: No congestion/rhinnorhea. Mouth/Throat: Mucous membranes are moist.   Neck: No stridor. Trachea Midline. FROM Cardiovascular: Normal rate, regular rhythm. Grossly normal heart sounds.  Good peripheral circulation. Respiratory: Normal respiratory effort.  No retractions. Lungs CTAB. Gastrointestinal: Soft and nontender. No distention. No abdominal bruits.  Musculoskeletal: No lower extremity tenderness nor edema.  No joint effusions. Neurologic:  Normal speech and language. No gross focal neurologic deficits are appreciated.  Skin:  Skin is warm, dry and intact. No rash noted. Psychiatric: Mood and affect are normal. Speech and behavior are normal.  Denies SI.  Positive drug use GU: Deferred   ____________________________________________   LABS (all labs ordered are listed, but only abnormal results are  displayed)  Labs Reviewed  COMPREHENSIVE METABOLIC PANEL - Abnormal; Notable for the following components:      Result Value   Glucose, Bld 132 (*)    Calcium 8.7 (*)    Albumin 3.4 (*)    AST 47 (*)    ALT 57 (*)    All other components within normal limits  URINE DRUG SCREEN, QUALITATIVE ([facility] ONLY) - Abnormal; Notable for the following components:   Amphetamines, Ur Screen POSITIVE (*)    Cannabinoid 50 Ng, Ur [place] POSITIVE (*)    Benzodiazepine, Ur Scrn POSITIVE (*)    All other components within normal limits  ETHANOL  CBC  PREGNANCY, URINE   ____________________________________________  INITIAL IMPRESSION / ASSESSMENT AND PLAN / ED COURSE  Isaly GEAN LAURSEN was evaluated in Emergency Department on 03/27/2019 for the symptoms described in the history of present illness. She was evaluated in the context of the global COVID-19 pandemic, which necessitated consideration that the patient might be at risk for infection with the SARS-CoV-2 virus that causes COVID-19. Institutional protocols and algorithms that pertain to the evaluation of patients at risk for COVID-19 are in a state of rapid change based on information released by regulatory bodies including the CDC and federal and state organizations. These policies and algorithms were followed during the patient's care in the ED.     Patient comes in with wanting to detox.  Patient uses heroin and meth.  States she last used yesterday.  Will get labs to evaluate for electrolyte abnormalities, AKI.  Patient is having symptoms of slight withdrawal with mild headaches and nausea although patient states that she is hungry.  No abdominal tenderness to suggest abdominal process.  We will give some Tylenol and Zofran.  Will discuss with social work for options for treatment.  Discussed with patient that we do not have detox unit here and that she would need to call other facilities. No TSS today.  Labs show slightly elevated LFTs.  Patient is had  this previously.  Negative pregnancy test.  Patient being seen by TTS who plans to try to get her to rehab facility.  If they are unable to we will discharge patient back home and she will be given resources.      ____________________________________________   FINAL CLINICAL IMPRESSION(S) / ED DIAGNOSES   Final diagnoses:  Substance abuse ([place])      MEDICATIONS GIVEN DURING THIS VISIT:  Medications  ondansetron (ZOFRAN-ODT) disintegrating tablet 4 mg (4 mg Oral Given 03/27/19 1843)  acetaminophen (TYLENOL) tablet 650 mg (650 mg Oral Given 03/27/19 1843)     ED Discharge Orders    None       Note:  This document was prepared using Dragon voice recognition software and may include unintentional dictation errors.   Vanessa [place], MD 03/27/19 2134

## 2019-03-27 NOTE — ED Notes (Addendum)
Referral information for Detox Placement have been faxed to;    [facility] [phone] or [phone])    RTS- for possible admission within the next two days as no female beds are immediately available.

## 2019-03-28 LAB — RESPIRATORY PANEL BY RT PCR (FLU A&B, COVID)
Influenza A by PCR: NEGATIVE
Influenza B by PCR: NEGATIVE
SARS Coronavirus 2 by RT PCR: NEGATIVE

## 2019-03-28 MED ORDER — LORATADINE 10 MG PO TABS
10.0000 mg | ORAL_TABLET | Freq: Once | ORAL | Status: AC
  Administered 2019-03-28: 10 mg via ORAL
  Filled 2019-03-28: qty 1

## 2019-03-28 MED ORDER — ONDANSETRON 4 MG PO TBDP
4.0000 mg | ORAL_TABLET | Freq: Three times a day (TID) | ORAL | Status: DC | PRN
  Administered 2019-03-28 – 2019-03-29 (×2): 4 mg via ORAL
  Filled 2019-03-28 (×4): qty 1

## 2019-03-28 MED ORDER — CLONIDINE HCL 0.1 MG PO TABS
0.2000 mg | ORAL_TABLET | Freq: Once | ORAL | Status: AC
  Administered 2019-03-28: 17:00:00 0.2 mg via ORAL
  Filled 2019-03-28: qty 2

## 2019-03-28 NOTE — ED Notes (Signed)
Patient given breakfast tray.

## 2019-03-28 NOTE — ED Notes (Signed)
Patient in the shower

## 2019-03-28 NOTE — ED Provider Notes (Signed)
-----------------------------------------   6:07 AM on 03/28/2019 -----------------------------------------   Blood pressure 121/86, pulse 91, temperature 98.2 F (36.8 C), temperature source Oral, resp. rate 18, height 5\' 3"  (1.6 m), weight 72.6 kg, last menstrual period 03/10/2019, SpO2 100 %.  The patient is calm and cooperative at this time.  There have been no acute events since the last update.  Awaiting disposition plan from Behavioral Medicine and/or Social Work team(s).   05/10/2019, MD 03/28/19 [phone]

## 2019-03-28 NOTE — ED Notes (Signed)
Hourly rounding reveals patient sleeping in room. No complaints, stable, in no acute distress. Q15 minute rounds and monitoring via Security Cameras to continue. 

## 2019-03-28 NOTE — ED Notes (Signed)

## 2019-03-28 NOTE — ED Notes (Signed)
Pt. Alert and oriented, warm and dry, in no distress. Pt. Denies SI, HI, and AVH. Pt. Encouraged to let nursing staff know of any concerns or needs. 

## 2019-03-28 NOTE — ED Notes (Signed)
Patient talking to daughter on the phone.

## 2019-03-28 NOTE — ED Notes (Signed)
Dinner tray with soda given to patient.  

## 2019-03-28 NOTE — ED Notes (Signed)
VOL  PENDING  PLACEMENT 

## 2019-03-28 NOTE — ED Notes (Signed)
Patient given medications due to feeling, anxious, and having some N/V. Patient also complained of body aches. Notified MD. Vital signs WNL

## 2019-03-29 ENCOUNTER — Other Ambulatory Visit: Payer: Self-pay

## 2019-03-29 ENCOUNTER — Encounter (HOSPITAL_COMMUNITY): Payer: Self-pay | Admitting: Psychiatry

## 2019-03-29 ENCOUNTER — Inpatient Hospital Stay (HOSPITAL_COMMUNITY)
Admission: AD | Admit: 2019-03-29 | Discharge: 2019-04-05 | DRG: 885 | Disposition: A | Discharge status: Home / Self Care | Payer: Medicaid Other | Source: Intra-hospital | Attending: Psychiatry | Admitting: Psychiatry

## 2019-03-29 DIAGNOSIS — F319 Bipolar disorder, unspecified: Principal | ICD-10-CM | POA: Diagnosis present

## 2019-03-29 DIAGNOSIS — Z886 Allergy status to analgesic agent status: Secondary | ICD-10-CM | POA: Diagnosis not present

## 2019-03-29 DIAGNOSIS — Z765 Malingerer [conscious simulation]: Secondary | ICD-10-CM | POA: Diagnosis not present

## 2019-03-29 DIAGNOSIS — R45851 Suicidal ideations: Secondary | ICD-10-CM | POA: Diagnosis present

## 2019-03-29 DIAGNOSIS — R110 Nausea: Secondary | ICD-10-CM | POA: Diagnosis not present

## 2019-03-29 DIAGNOSIS — F17210 Nicotine dependence, cigarettes, uncomplicated: Secondary | ICD-10-CM | POA: Diagnosis present

## 2019-03-29 DIAGNOSIS — G8929 Other chronic pain: Secondary | ICD-10-CM | POA: Diagnosis not present

## 2019-03-29 DIAGNOSIS — Z590 Homelessness: Secondary | ICD-10-CM

## 2019-03-29 DIAGNOSIS — F419 Anxiety disorder, unspecified: Secondary | ICD-10-CM | POA: Diagnosis present

## 2019-03-29 DIAGNOSIS — F1520 Other stimulant dependence, uncomplicated: Secondary | ICD-10-CM | POA: Diagnosis not present

## 2019-03-29 DIAGNOSIS — Z885 Allergy status to narcotic agent status: Secondary | ICD-10-CM | POA: Diagnosis not present

## 2019-03-29 DIAGNOSIS — M549 Dorsalgia, unspecified: Secondary | ICD-10-CM | POA: Diagnosis not present

## 2019-03-29 DIAGNOSIS — T1491XA Suicide attempt, initial encounter: Secondary | ICD-10-CM | POA: Diagnosis present

## 2019-03-29 DIAGNOSIS — Z79899 Other long term (current) drug therapy: Secondary | ICD-10-CM | POA: Diagnosis not present

## 2019-03-29 DIAGNOSIS — Z23 Encounter for immunization: Secondary | ICD-10-CM

## 2019-03-29 DIAGNOSIS — Z880 Allergy status to penicillin: Secondary | ICD-10-CM

## 2019-03-29 DIAGNOSIS — F1994 Other psychoactive substance use, unspecified with psychoactive substance-induced mood disorder: Secondary | ICD-10-CM | POA: Diagnosis not present

## 2019-03-29 DIAGNOSIS — Z915 Personal history of self-harm: Secondary | ICD-10-CM | POA: Diagnosis not present

## 2019-03-29 DIAGNOSIS — F1910 Other psychoactive substance abuse, uncomplicated: Secondary | ICD-10-CM | POA: Diagnosis not present

## 2019-03-29 DIAGNOSIS — G4700 Insomnia, unspecified: Secondary | ICD-10-CM | POA: Diagnosis not present

## 2019-03-29 DIAGNOSIS — F1920 Other psychoactive substance dependence, uncomplicated: Secondary | ICD-10-CM | POA: Diagnosis not present

## 2019-03-29 MED ORDER — ALUM & MAG HYDROXIDE-SIMETH 200-200-20 MG/5ML PO SUSP: 30.0000 mL | ORAL | Status: DC | PRN

## 2019-03-29 MED ORDER — DICYCLOMINE HCL 20 MG PO TABS
20.0000 mg | ORAL_TABLET | Freq: Four times a day (QID) | ORAL | Status: AC | PRN
  Administered 2019-03-30 – 2019-04-02 (×4): 20 mg via ORAL
  Filled 2019-03-29 (×4): qty 1

## 2019-03-29 MED ORDER — HYDROXYZINE HCL 25 MG PO TABS
25.0000 mg | ORAL_TABLET | ORAL | Status: DC | PRN
  Administered 2019-03-29 – 2019-04-04 (×9): 25 mg via ORAL
  Filled 2019-03-29 (×10): qty 1

## 2019-03-29 MED ORDER — MAGNESIUM HYDROXIDE 400 MG/5ML PO SUSP: 30.0000 mL | Freq: Every day | ORAL | Status: DC | PRN

## 2019-03-29 MED ORDER — METHOCARBAMOL 500 MG PO TABS
500.0000 mg | ORAL_TABLET | Freq: Three times a day (TID) | ORAL | Status: AC | PRN
  Administered 2019-03-30 – 2019-04-02 (×5): 500 mg via ORAL
  Filled 2019-03-29 (×5): qty 1

## 2019-03-29 MED ORDER — ACETAMINOPHEN 325 MG PO TABS
650.0000 mg | ORAL_TABLET | Freq: Four times a day (QID) | ORAL | Status: DC | PRN
  Administered 2019-03-30 – 2019-04-04 (×7): 650 mg via ORAL
  Filled 2019-03-29 (×7): qty 2

## 2019-03-29 MED ORDER — ONDANSETRON 4 MG PO TBDP: 4.0000 mg | ORAL_TABLET | Freq: Three times a day (TID) | ORAL | Status: DC | PRN

## 2019-03-29 MED ORDER — ESCITALOPRAM OXALATE 20 MG PO TABS
20.0000 mg | ORAL_TABLET | Freq: Every day | ORAL | Status: DC
  Administered 2019-03-30 – 2019-04-02 (×4): 20 mg via ORAL
  Filled 2019-03-29 (×3): qty 1
  Filled 2019-03-29: qty 2
  Filled 2019-03-29 (×2): qty 1
  Filled 2019-03-29: qty 2

## 2019-03-29 MED ORDER — PNEUMOCOCCAL VAC POLYVALENT 25 MCG/0.5ML IJ INJ
0.5000 mL | INJECTION | INTRAMUSCULAR | Status: AC
  Administered 2019-04-02: 0.5 mL via INTRAMUSCULAR
  Filled 2019-03-29: qty 0.5

## 2019-03-29 MED ORDER — ONDANSETRON 4 MG PO TBDP
4.0000 mg | ORAL_TABLET | Freq: Three times a day (TID) | ORAL | Status: DC | PRN
  Administered 2019-03-30 – 2019-04-02 (×4): 4 mg via ORAL
  Filled 2019-03-29 (×4): qty 1

## 2019-03-29 MED ORDER — LOPERAMIDE HCL 2 MG PO CAPS: 2.0000 mg | ORAL_CAPSULE | ORAL | Status: AC | PRN

## 2019-03-29 MED ORDER — LORAZEPAM 1 MG PO TABS
1.0000 mg | ORAL_TABLET | Freq: Four times a day (QID) | ORAL | Status: DC | PRN
  Administered 2019-03-30 – 2019-04-01 (×4): 1 mg via ORAL
  Filled 2019-03-29 (×4): qty 1

## 2019-03-29 NOTE — Tx Team (Signed)
Initial Treatment Plan 03/29/2019 6:06 PM [name] [id number]    PATIENT STRESSORS: Marital or family conflict Substance abuse Traumatic event   PATIENT STRENGTHS: Ability for insight Communication skills Supportive family/friends   PATIENT IDENTIFIED PROBLEMS: Depression  Anxiety  Substance use                  DISCHARGE CRITERIA:  Improved stabilization in mood, thinking, and/or behavior Safe-care adequate arrangements made Verbal commitment to aftercare and medication compliance  PRELIMINARY DISCHARGE PLAN: Attend aftercare/continuing care group Outpatient therapy Return to previous living arrangement  PATIENT/FAMILY INVOLVEMENT: This treatment plan has been presented to and reviewed with the patient, [name].  The patient has been given the opportunity to ask questions and make suggestions.  Garnette Scheuermann, RN 03/29/2019, 6:06 PM

## 2019-03-29 NOTE — ED Provider Notes (Signed)
The patient has been placed in psychiatric observation due to the need to provide a safe environment for the patient while obtaining psychiatric consultation and evaluation, as well as ongoing medical and medication management to treat the patient's condition.  The patient has not been placed under full IVC at this time.    Abigail Se, MD 03/29/19 [phone]

## 2019-03-29 NOTE — ED Notes (Signed)
Report called and given to receiving nurse Chrissie Noa.

## 2019-03-29 NOTE — Progress Notes (Addendum)
Pt accepted to [facility]; bed 302-2     Denzil Magnuson, NP is the accepting provider.    Dr. Jola Babinski is the attending provider.    Call report to [phone]    [facility] ED notified.     Pt is voluntary and will be transported by [employer].  Pt is scheduled to arrive at [facility] at 2pm.   Wells Guiles, LCSW, LCAS Disposition CSW [facility]/TTS [phone]

## 2019-03-29 NOTE — ED Notes (Signed)
Pt up to the bathroom and returned to room. No co's voiced.

## 2019-03-29 NOTE — Progress Notes (Signed)
Psychoeducational Group Note  Date:  03/29/2019 Time: 2030 Group Topic/Focus:  wrap up group  Participation Level: Did Not Attend  Participation Quality:  Not Applicable  Affect:  Not Applicable  Cognitive:  Not Applicable  Insight:  Not Applicable  Engagement in Group: Not Applicable  Additional Comments: Pt was asleep during group time.   Marcille Buffy 03/29/2019, 9:49 PM

## 2019-03-29 NOTE — ED Notes (Signed)
Transport here for patient. All belonging given to patient chart to driver.

## 2019-03-29 NOTE — ED Notes (Signed)
As per patient requested that her daughter Leavy Cella be called and updated on her status. Daughter called no answer at [phone]. [facility]

## 2019-03-29 NOTE — ED Notes (Signed)
Pt up to the bathroom and returned to bed. No co's voiced.  

## 2019-03-29 NOTE — Progress Notes (Signed)
Pt transferred from [facility] to bed 303-1 at [facility].  Patient was admitted voluntarily.  Pt presents with anxiety and depression.  She admits to chronic issue with substance use and UDS was positive for methamphetamines, cannabis, and benzodiazepines.  Pt denies SI,HI,AVH. Pt is very concerned for her boyfriend who also has an issues with drug abuse and he was admitted to [facility] for drug OD the same day pt was admitted.  Pt is tearful during assessment because she is not sure if her boyfriend is alive.    RN oriented pt to unit, staff and room.  RN initiated q 15 min safety checks.  Pt signed admission paperwork.  Pt in agreement and verbalized understanding of plan of care.

## 2019-03-29 NOTE — ED Notes (Signed)
Assumed care of patient, patient sleeping comfortably, awakens to name. Presently denied any SI/HV/HI. Patient reports awaiting to go to detox. Will continue to monitor. Safety maintained.

## 2019-03-29 NOTE — ED Provider Notes (Signed)
Emergency Medicine Observation Re-evaluation Note  [name] is a 39 y.o. female, seen on rounds today.  Pt initially presented to the ED for complaints of detox   Physical Exam  BP 129/77 (BP Location: Left Arm)   Pulse 77   Temp 98.2 F (36.8 C) (Oral)   Resp 18   Ht 5\' 3"  (1.6 m)   Wt 72.6 kg   LMP 03/10/2019   SpO2 99%   BMI 28.34 kg/m  Physical Exam  ED Course / MDM  EKG:    I have reviewed the labs performed to date as well as medications administered while in observation.  Recent changes in the last 24 hours include none. Plan  Current plan is for waiting for placement for detox. Patient is not under full IVC at this time.   05/10/2019, MD 03/29/19 03/31/19

## 2019-03-30 DIAGNOSIS — F1920 Other psychoactive substance dependence, uncomplicated: Secondary | ICD-10-CM

## 2019-03-30 DIAGNOSIS — F1520 Other stimulant dependence, uncomplicated: Secondary | ICD-10-CM | POA: Diagnosis not present

## 2019-03-30 DIAGNOSIS — F1994 Other psychoactive substance use, unspecified with psychoactive substance-induced mood disorder: Secondary | ICD-10-CM

## 2019-03-30 DIAGNOSIS — F319 Bipolar disorder, unspecified: Secondary | ICD-10-CM | POA: Diagnosis not present

## 2019-03-30 LAB — HEMOGLOBIN A1C
Hgb A1c MFr Bld: 5.4 % (ref 4.8–5.6)
Mean Plasma Glucose: 108.28 mg/dL

## 2019-03-30 LAB — LIPID PANEL
Cholesterol: 184 mg/dL (ref 0–200)
HDL: 53 mg/dL (ref >40)
LDL Cholesterol: 115 mg/dL — ABNORMAL HIGH (ref 0–99)
Total CHOL/HDL Ratio: 3.5 RATIO
Triglycerides: 80 mg/dL (ref <150)
VLDL: 16 mg/dL (ref 0–40)

## 2019-03-30 LAB — TSH: TSH: 1.16 u[IU]/mL (ref 0.350–4.500)

## 2019-03-30 MED ORDER — NICOTINE 14 MG/24HR TD PT24
14.0000 mg | MEDICATED_PATCH | Freq: Every day | TRANSDERMAL | Status: DC
  Administered 2019-03-30 – 2019-04-05 (×7): 14 mg via TRANSDERMAL
  Filled 2019-03-30 (×10): qty 1

## 2019-03-30 MED ORDER — BUPROPION HCL ER (SR) 150 MG PO TB12
150.0000 mg | ORAL_TABLET | Freq: Two times a day (BID) | ORAL | Status: DC
  Administered 2019-03-30 – 2019-04-03 (×8): 150 mg via ORAL
  Filled 2019-03-30 (×13): qty 1

## 2019-03-30 MED ORDER — TRAZODONE HCL 50 MG PO TABS
50.0000 mg | ORAL_TABLET | Freq: Every evening | ORAL | Status: DC | PRN
  Administered 2019-03-30 – 2019-04-02 (×4): 50 mg via ORAL
  Filled 2019-03-30 (×13): qty 1

## 2019-03-30 NOTE — Progress Notes (Signed)
   03/30/19 0100  Psych Admission Type (Psych Patients Only)  Admission Status Voluntary  Psychosocial Assessment  Patient Complaints Anxiety  Eye Contact Fair  Facial Expression Sullen;Worried  Affect Appropriate to circumstance  Systems analyst;Soft  Interaction Guarded  Motor Activity Slow  Appearance/Hygiene In scrubs  Behavior Characteristics Cooperative  Mood Depressed  Aggressive Behavior  Targets Other (Comment) (PTA-OD per nursing report)  Type of Behavior Other (Comment) (PTA-Overdose )  Effect No apparent injury  Thought Process  Coherency WDL  Content Blaming self;Blaming others  Delusions None reported or observed  Perception WDL  Hallucination None reported or observed  Judgment Limited  Confusion None  Danger to Self  Current suicidal ideation? Denies  Danger to Others  Danger to Others None reported or observed
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
## 2019-03-30 NOTE — Progress Notes (Signed)
Recreation Therapy Notes  Date: 3.26.21 Time: 0930 Location: 300 Hall Group Room  Group Topic: Stress Management  Goal Area(s) Addresses:  Patient will identify positive stress management techniques. Patient will identify benefits of using stress management post d/c.  Intervention: Stress Management  Activity : Mountain Meditation.  LRT led group in a meditation that focused on taking the characteristics of a mountain and bringing into your meditation and everyday life.  Patients were to listen and follow as meditation played.  Education:  Stress Management, Discharge Planning.   Education Outcome: Acknowledges Education  Clinical Observations/Feedback: Pt did not attend group session.    Nyah Shepherd, LRT/CTRS         Nargis Abrams A 03/30/2019 11:13 AM 

## 2019-03-30 NOTE — Tx Team (Addendum)
Interdisciplinary Treatment and Diagnostic Plan Update  03/30/2019 Time of Session: 9:05a [name] MRN: [id number]  Principal Diagnosis: <principal problem not specified>  Secondary Diagnoses: Active Problems:   Bipolar 1 disorder ([place])   Suicide attempt ([place])   Current Medications:  Current Facility-Administered Medications  Medication Dose Route Frequency Provider Last Rate Last Admin  . acetaminophen (TYLENOL) tablet 650 mg  650 mg Oral Q6H PRN Sharma Covert, MD      . alum & mag hydroxide-simeth (MAALOX/MYLANTA) 200-200-20 MG/5ML suspension 30 mL  30 mL Oral Q4H PRN Sharma Covert, MD      . dicyclomine (BENTYL) tablet 20 mg  20 mg Oral Q6H PRN Sharma Covert, MD      . escitalopram (LEXAPRO) tablet 20 mg  20 mg Oral Daily Sharma Covert, MD      . hydrOXYzine (ATARAX/VISTARIL) tablet 25 mg  25 mg Oral Q4H PRN Sharma Covert, MD   25 mg at 03/29/19 2109  . loperamide (IMODIUM) capsule 2-4 mg  2-4 mg Oral PRN Sharma Covert, MD      . LORazepam (ATIVAN) tablet 1 mg  1 mg Oral Q6H PRN Sharma Covert, MD      . magnesium hydroxide (MILK OF MAGNESIA) suspension 30 mL  30 mL Oral Daily PRN Sharma Covert, MD      . methocarbamol (ROBAXIN) tablet 500 mg  500 mg Oral Q8H PRN Sharma Covert, MD      . ondansetron (ZOFRAN-ODT) disintegrating tablet 4 mg  4 mg Oral Q8H PRN Sharma Covert, MD      . pneumococcal 23 valent vaccine (PNEUMOVAX-23) injection 0.5 mL  0.5 mL Intramuscular Tomorrow-1000 Mallie Darting Cordie Grice, MD       PTA Medications: Medications Prior to Admission  Medication Sig Dispense Refill Last Dose  . ALPRAZolam (XANAX) 1 MG tablet Take 1 tablet (1 mg total) by mouth 2 (two) times daily. 60 tablet 5   . buPROPion (WELLBUTRIN SR) 150 MG 12 hr tablet TAKE ONE TABLET BY MOUTH TWICE DAILY 60 tablet 5   . escitalopram (LEXAPRO) 20 MG tablet Take 1 tablet (20 mg total) by mouth every morning. 30 tablet 5     Patient Stressors:  Marital or family conflict Substance abuse Traumatic event  Patient Strengths: Ability for insight Communication skills Supportive family/friends  Treatment Modalities: Medication Management, Group therapy, Case management,  1 to 1 session with clinician, Psychoeducation, Recreational therapy.   Physician Treatment Plan for Primary Diagnosis: <principal problem not specified> Long Term Goal(s):     Short Term Goals:    Medication Management: Evaluate patient's response, side effects, and tolerance of medication regimen.  Therapeutic Interventions: 1 to 1 sessions, Unit Group sessions and Medication administration.  Evaluation of Outcomes: Not Met  Physician Treatment Plan for Secondary Diagnosis: Active Problems:   Bipolar 1 disorder ([place])   Suicide attempt ([place])  Long Term Goal(s):     Short Term Goals:       Medication Management: Evaluate patient's response, side effects, and tolerance of medication regimen.  Therapeutic Interventions: 1 to 1 sessions, Unit Group sessions and Medication administration.  Evaluation of Outcomes: Not Met   RN Treatment Plan for Primary Diagnosis: <principal problem not specified> Long Term Goal(s): Knowledge of disease and therapeutic regimen to maintain health will improve  Short Term Goals: Ability to participate in decision making will improve, Ability to verbalize feelings will improve, Ability to disclose and discuss suicidal ideas, Ability  to identify and develop effective coping behaviors will improve and Compliance with prescribed medications will improve  Medication Management: RN will administer medications as ordered by provider, will assess and evaluate patient's response and provide education to patient for prescribed medication. RN will report any adverse and/or side effects to prescribing provider.  Therapeutic Interventions: 1 on 1 counseling sessions, Psychoeducation, Medication administration, Evaluate responses to  treatment, Monitor vital signs and CBGs as ordered, Perform/monitor CIWA, COWS, AIMS and Fall Risk screenings as ordered, Perform wound care treatments as ordered.  Evaluation of Outcomes: Not Met   LCSW Treatment Plan for Primary Diagnosis: <principal problem not specified> Long Term Goal(s): Safe transition to appropriate next level of care at discharge, Engage patient in therapeutic group addressing interpersonal concerns.  Short Term Goals: Engage patient in aftercare planning with referrals and resources  Therapeutic Interventions: Assess for all discharge needs, 1 to 1 time with Social worker, Explore available resources and support systems, Assess for adequacy in community support network, Educate family and significant other(s) on suicide prevention, Complete Psychosocial Assessment, Interpersonal group therapy.  Evaluation of Outcomes: Not Met   Progress in Treatment: Attending groups: No. New to unit  Participating in groups: No. Taking medication as prescribed: Yes. Toleration medication: Yes. Family/Significant other contact made: No, will contact:  the patient's daughter Patient understands diagnosis: Yes. Discussing patient identified problems/goals with staff: Yes. Medical problems stabilized or resolved: Yes. Denies suicidal/homicidal ideation: Yes. Issues/concerns per patient self-inventory: No. Other:   New problem(s) identified: None   New Short Term/Long Term Goal(s):Detox, medication stabilization, elimination of SI thoughts, development of comprehensive mental wellness plan.    Patient Goals: "To get off drugs"    Discharge Plan or Barriers: Patient recently admitted. Patient expressed interest residential treatment. CSW will continue to follow and assess for appropriate referrals and possible discharge planning.    Reason for Continuation of Hospitalization: Anxiety Depression Medication stabilization Suicidal ideation  Estimated Length of Stay: 3-5  days   Attendees: Patient: [name]  03/30/2019 9:21 AM  Physician: Dr. Myles Lipps, MD 03/30/2019 9:21 AM  Nursing:  03/30/2019 9:21 AM  RN Care Manager: 03/30/2019 9:21 AM  Social Worker: Radonna Ricker, LCSW 03/30/2019 9:21 AM  Recreational Therapist:  03/30/2019 9:21 AM  Other:  03/30/2019 9:21 AM  Other:  03/30/2019 9:21 AM  Other: 03/30/2019 9:21 AM    Scribe for Treatment Team: Marylee Floras, [place] 03/30/2019 9:21 AM
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
## 2019-03-30 NOTE — [facility] Group Notes (Cosign Needed)
[facility] LCSW Group Therapy Note   Date and Time: 3/26 @ 1:00pm   Type of Group and Topic: Psychoeducational Group: Discharge Planning and Individual Wellness  Participation Level: [facility] PARTICIPATION LEVEL: Did Not Attend    Description of Group: Discharge planning group reviews patient's anticipated discharge plans and assists patients to anticipate and address any barriers to wellness/recovery in the community. Suicide prevention education is reviewed with patients in group. Therapeutic Goals 1. Patients will state their anticipated discharge plan and mental health aftercare 2. Patients will identify potential barriers to wellness in the community setting 3. Patients will engage in problem solving, solution focused discussion of ways to anticipate and address barriers to wellness/recovery     Summary of Patient Progress: Pt did not attend   Plan for Discharge/Comments: Pt did not attend    Transportation Means: Pt did not attend   Supports:  Pt did not attend    Therapeutic Modalities: Motivational Interviewing and Psycho-Education    Taheera Thomann, MSW intern 

## 2019-03-30 NOTE — [facility] Suicide Risk Assessment (Addendum)
[facility] INPATIENT:  Family/Significant Other Suicide Prevention Education  Suicide Prevention Education:  Contact Attempts: with daughter, Noralee Space, [phone]  has been identified by the patient as the family member/significant other with whom the patient will be residing, and identified as the person(s) who will aid the patient in the event of a mental health crisis.  With written consent from the patient, two attempts were made to provide suicide prevention education, prior to and/or following the patient's discharge.  We were unsuccessful in providing suicide prevention education.  A suicide education pamphlet was given to the patient to share with family/significant other.  Date and time of first attempt: 03/30/19 @ 10:20am  Date and time of second attempt:  03/31/2019 @ 4:42 PM  - HIPAA-compliant VM left  Reynold Bowen 03/30/2019, 10:23 AM   Ambrose Mantle, LCSW 03/31/2019, 4:43 PM

## 2019-03-31 DIAGNOSIS — F319 Bipolar disorder, unspecified: Secondary | ICD-10-CM | POA: Diagnosis not present

## 2019-03-31 DIAGNOSIS — F1994 Other psychoactive substance use, unspecified with psychoactive substance-induced mood disorder: Secondary | ICD-10-CM | POA: Diagnosis not present

## 2019-03-31 DIAGNOSIS — F1520 Other stimulant dependence, uncomplicated: Secondary | ICD-10-CM | POA: Diagnosis not present

## 2019-03-31 DIAGNOSIS — F1920 Other psychoactive substance dependence, uncomplicated: Secondary | ICD-10-CM | POA: Diagnosis not present

## 2019-03-31 NOTE — Progress Notes (Signed)
[facility] MD Progress Note  03/31/2019 11:43 AM VAUNDA GUTTERMAN  MRN:  [id number]  Subjective: Shasta reports, "I feel tired. My mood is up & down. I'm still feeling very depressed. I don't think I slept well last night".  Objective: Patient is a 39 year old female with a past psychiatric history significant for opiate dependence, methamphetamine dependence as well as benzodiazepine dependence who presented to the [facility] on 03/27/2019 with suicidal ideation.  Clovis is seen, chart reviewed. The chart findings discussed with the treatment team. She presents alert, oriented & aware of situation. She is visible on the unit, attending group sessions.She continues to complain of symptoms of depression. However, denies any SIHI, AVH, delusional thoughts or paranoia. She is in agreement to continue current plan of care as already in progress.  Principal Problem: Bipolar 1 disorder (HCC)  Diagnosis: Principal Problem:   Bipolar 1 disorder (HCC) Active Problems:   Suicide attempt (HCC)   Amphetamine use disorder, severe, dependence (HCC)  Total Time spent with patient: 25 minutes  Past Psychiatric History: See H&P  Past Medical History:  Past Medical History:  Diagnosis Date  . Anxiety    takes Xanax daily as needed  . Chronic back pain    herniated disc  . Chronic pelvic pain in female   . Depression    takes Lexapro daily  . Drug-seeking behavior   . Headache    constant  . History of bronchitis    yrs ago  . History of kidney stones   . Insomnia    takes Elavil nightly  . Nocturia   . Seasonal allergies    Claritin and FLonase daily as needed    Past Surgical History:  Procedure Laterality Date  . CHOLECYSTECTOMY    . I & D EXTREMITY Left 08/03/2016   Procedure: IRRIGATION AND DEBRIDEMENT LEFT HAND;  Surgeon: Dominica Severin, MD;  Location: [facility] ORS;  Service: Orthopedics;  Laterality: Left;  . THORACIC DISCECTOMY Right 03/28/2014   Procedure: RIGHT  THORACIC ELEVEN-TWELVE MICRODICSECTOMY;  Surgeon: Tressie Stalker, MD;  Location: [facility]UR[facility] OR[facility];  Service: Neurosurgery;  Laterality: Right;  right  . TUBAL LIGATION     Family History:  Family History  Problem Relation Age of Onset  . Hypertension Mother   . Hypertension Father   . Hypertension Sister   . Stroke Other   . Cancer Other   . Colon cancer Neg Hx    Family Psychiatric  History: See H&P.  Social History:  Social History   Substance and Sexual Activity  Alcohol Use Yes   Comment: occ     Social History   Substance and Sexual Activity  Drug Use Yes  . Types: Marijuana, Methamphetamines    Social History   Socioeconomic History  . Marital status: Legally Separated    Spouse name: Not on file  . Number of children: 3  . Years of education: Not on file  . Highest education level: Not on file  Occupational History  . Not on file  Tobacco Use  . Smoking status: Current Every Day Smoker    Packs/day: 0.50    Years: 0.00    Pack years: 0.00    Types: Cigarettes  . Smokeless tobacco: Never Used  Substance and Sexual Activity  . Alcohol use: Yes    Comment: occ  . Drug use: Yes    Types: Marijuana, Methamphetamines  . Sexual activity: Not on file  Other Topics Concern  . Not on file  Social History Narrative  . Not on file   Social Determinants of Health   Financial Resource Strain:   . Difficulty of Paying Living Expenses:   Food Insecurity:   . Worried About Programme researcher, broadcasting/film/video in the Last Year:   . Barista in the Last Year:   Transportation Needs:   . Freight forwarder (Medical):   Marland Kitchen Lack of Transportation (Non-Medical):   Physical Activity:   . Days of Exercise per Week:   . Minutes of Exercise per Session:   Stress:   . Feeling of Stress :   Social Connections:   . Frequency of Communication with Friends and Family:   . Frequency of Social Gatherings with Friends and Family:   . Attends Religious Services:   . Active Member  of Clubs or Organizations:   . Attends Banker Meetings:   Marland Kitchen Marital Status:    Additional Social History:   Sleep: Good  Appetite:  Good  Current Medications: Current Facility-Administered Medications  Medication Dose Route Frequency Provider Last Rate Last Admin  . acetaminophen (TYLENOL) tablet 650 mg  650 mg Oral Q6H PRN Antonieta Pert, MD   650 mg at 03/30/19 2200  . alum & mag hydroxide-simeth (MAALOX/MYLANTA) 200-200-20 MG/5ML suspension 30 mL  30 mL Oral Q4H PRN Antonieta Pert, MD      . buPROPion [facility] SR) 12 hr tablet 150 mg  150 mg Oral BID Antonieta Pert, MD   150 mg at 03/31/19 1030  . dicyclomine (BENTYL) tablet 20 mg  20 mg Oral Q6H PRN Antonieta Pert, MD   20 mg at 03/30/19 2202  . escitalopram (LEXAPRO) tablet 20 mg  20 mg Oral Daily Antonieta Pert, MD   20 mg at 03/31/19 1031  . hydrOXYzine (ATARAX/VISTARIL) tablet 25 mg  25 mg Oral Q4H PRN Antonieta Pert, MD   25 mg at 03/30/19 0939  . loperamide (IMODIUM) capsule 2-4 mg  2-4 mg Oral PRN Antonieta Pert, MD      . LORazepam (ATIVAN) tablet 1 mg  1 mg Oral Q6H PRN Antonieta Pert, MD   1 mg at 03/31/19 1033  . magnesium hydroxide (MILK OF MAGNESIA) suspension 30 mL  30 mL Oral Daily PRN Antonieta Pert, MD      . methocarbamol (ROBAXIN) tablet 500 mg  500 mg Oral Q8H PRN Antonieta Pert, MD   500 mg at 03/31/19 1033  . nicotine (NICODERM CQ - dosed in mg/24 hours) patch 14 mg  14 mg Transdermal Daily Antonieta Pert, MD   14 mg at 03/31/19 1033  . ondansetron (ZOFRAN-ODT) disintegrating tablet 4 mg  4 mg Oral Q8H PRN Antonieta Pert, MD   4 mg at 03/30/19 1803  . pneumococcal 23 valent vaccine (PNEUMOVAX-23) injection 0.5 mL  0.5 mL Intramuscular Tomorrow-1000 Antonieta Pert, MD      . traZODone (DESYREL) tablet 50 mg  50 mg Oral QHS,MR X 1 Nira Conn A, NP   50 mg at 03/30/19 2202   Lab Results:  Results for orders placed or performed during the  hospital encounter of 03/29/19 (from the past 48 hour(s))  Hemoglobin A1c     Status: None   Collection Time: 03/30/19  7:00 AM  Result Value Ref Range   Hgb A1c MFr Bld 5.4 4.8 - 5.6 %    Comment: (NOTE) Pre diabetes:  5.7%-6.4% Diabetes:              >6.4% Glycemic control for   <7.0% adults with diabetes    Mean Plasma Glucose 108.28 mg/dL    Comment: Performed at [facility] [address], [place], [place] [address]  Lipid panel     Status: Abnormal   Collection Time: 03/30/19  7:00 AM  Result Value Ref Range   Cholesterol 184 0 - 200 mg/dL   Triglycerides 80 <150 mg/dL   HDL 53 >40 mg/dL   Total CHOL/HDL Ratio 3.5 RATIO   VLDL 16 0 - 40 mg/dL   LDL Cholesterol 115 (H) 0 - 99 mg/dL    Comment:        Total Cholesterol/HDL:CHD Risk Coronary Heart Disease Risk Table                     Men   Women  1/2 Average Risk   3.4   3.3  Average Risk       5.0   4.4  2 X Average Risk   9.6   7.1  3 X Average Risk  23.4   11.0        Use the calculated Patient Ratio above and the CHD Risk Table to determine the patient's CHD Risk.        ATP III CLASSIFICATION (LDL):  <100     mg/dL   Optimal  100-129  mg/dL   Near or Above                    Optimal  130-159  mg/dL   Borderline  160-189  mg/dL   High  >190     mg/dL   Very High Performed at [facility] [address], [place], [place] [address]   TSH     Status: None   Collection Time: 03/30/19  7:00 AM  Result Value Ref Range   TSH 1.160 0.350 - 4.500 uIU/mL    Comment: Performed by a 3rd Generation assay with a functional sensitivity of <=0.01 uIU/mL. Performed at [facility], [place] [address] [place], [place] [address]    Blood Alcohol level:  Lab Results  Component Value Date   [facility] <10 03/27/2019   ETH <10 90/24/0973   Metabolic Disorder Labs: Lab Results  Component Value Date   HGBA1C 5.4 03/30/2019   MPG 108.28 03/30/2019   No results found for:  PROLACTIN Lab Results  Component Value Date   CHOL 184 03/30/2019   TRIG 80 03/30/2019   HDL 53 03/30/2019   CHOLHDL 3.5 03/30/2019   VLDL 16 03/30/2019   LDLCALC 115 (H) 03/30/2019   Physical Findings: AIMS: Facial and Oral Movements Muscles of Facial Expression: None, normal Lips and Perioral Area: None, normal Jaw: None, normal Tongue: None, normal,Extremity Movements Upper (arms, wrists, hands, fingers): None, normal Lower (legs, knees, ankles, toes): None, normal, Trunk Movements Neck, shoulders, hips: None, normal, Overall Severity Severity of abnormal movements (highest score from questions above): None, normal Incapacitation due to abnormal movements: None, normal Patient's awareness of abnormal movements (rate only patient's report): No Awareness, Dental Status Current problems with teeth and/or dentures?: No Does patient usually wear dentures?: No  CIWA:  CIWA-Ar Total: 9 COWS:  COWS Total Score: 9  Musculoskeletal: Strength & Muscle Tone: within normal limits Gait & Station: normal Patient leans: N/A  Psychiatric Specialty Exam: Physical Exam  Nursing note and vitals reviewed.  Constitutional: She is oriented to person, place, and time. She appears well-developed.  Cardiovascular: Normal rate.  Respiratory: Effort normal.  Genitourinary:    Genitourinary Comments: Deferred   Musculoskeletal:        General: Normal range of motion.     Cervical back: Normal range of motion.  Neurological: She is alert and oriented to person, place, and time.  Skin: Skin is warm and dry.    Review of Systems  Constitutional: Negative for chills, diaphoresis and fever.  HENT: Negative for congestion, rhinorrhea, sneezing and sore throat.   Eyes: Negative for discharge.  Respiratory: Negative for chest tightness, shortness of breath and wheezing.   Cardiovascular: Negative for chest pain and palpitations.  Gastrointestinal: Negative for diarrhea, nausea and vomiting.   Endocrine: Negative for cold intolerance.  Genitourinary: Negative for difficulty urinating.  Musculoskeletal: Negative.   Allergic/Immunologic: Negative for environmental allergies and food allergies.       Allergie: Motrin, hydrocodone, Ibuprofen, Ultram & PCN  Neurological: Negative.   Psychiatric/Behavioral: Positive for dysphoric mood and sleep disturbance. Negative for agitation, behavioral problems, confusion, decreased concentration, hallucinations, self-injury and suicidal ideas. The patient is nervous/anxious. The patient is not hyperactive.     Blood pressure 98/69, pulse 84, temperature 99.8 F (37.7 C), resp. rate 18, height 5\' 3"  (1.6 m), weight 72.6 kg, last menstrual period 03/10/2019, SpO2 97 %.Body mass index is 28.34 kg/m.  General Appearance:Disheveled  Eye Contact:Fair  Speech:Normal Rate  Volume:Decreased  Mood:Anxious, Depressed and Dysphoric  Affect:Congruent  Thought Process:Coherent and Descriptions of Associations:Intact  Orientation:Full (Time, Place, and Person)  Thought Content:Logical  Suicidal Thoughts:No  Homicidal Thoughts:No  Memory:Immediate;Fair Recent;Fair Remote;Fair  Judgement:Intact  Insight:Fair  Psychomotor Activity:Increased  Concentration:Concentration:Fairand Attention Span: Fair  Recall:Fair  Fund of Knowledge:Fair  Language:Good  Akathisia:Negative  Handed:Right  AIMS (if indicated):   Assets:Desire for Improvement Resilience  ADL's:Intact  Cognition:WNL    Sleep:  Number of Hours: 6.75   Treatment Plan Summary: Daily contact with patient to assess and evaluate symptoms and progress in treatment and Medication management.  -Continue inpatient hospitalization.  -Will continue today 03/31/2019 plan as below except where it is noted.  -Depression   -Continue Lexapro 20 mg po daily.             -Continue Wellbutrin SR 150 mg po daily.  -Anxiety  -Continue  atarax 25 mg po q4h prn.             -Continue Lorazepam 1 mg po Q 6 hrs prn for CIWA >10.  -Insomnia  -Continue Trazodone 50 mg po prn qhs, may repeat x 1.  -Opioid withdrawal.             -Continue Bentyl 20 mg po Q 6 hrs prn.             -Continue Zofran 4 mg po Q 6 hrs prn for nausea.             -Continue Imodium 2-4 mg po prn for diarrhea.             -Continue Robaxin 500 mg po Q 8 hrs prn.             -Nicotine withdrawal.              -Continue Nicoderm patch 14 mg transdermally Q 24 hrs.     -Encourage participation in groups and therapeutic milieu  -Disposition planning will be ongoing  04/02/2019, NP, PMHNP, FNP-BC 03/31/2019, 11:43 AM

## 2019-03-31 NOTE — Progress Notes (Signed)
   03/31/19 2240  Psych Admission Type (Psych Patients Only)  Admission Status Voluntary  Psychosocial Assessment  Patient Complaints Anxiety  Eye Contact Brief  Facial Expression Flat  Affect Appropriate to circumstance  Speech Logical/coherent  Interaction Minimal  Motor Activity Slow  Appearance/Hygiene Unremarkable  Behavior Characteristics Appropriate to situation  Mood Depressed;Anxious  Thought Process  Coherency WDL  Content WDL  Delusions None reported or observed  Perception WDL  Hallucination None reported or observed  Judgment Impaired  Confusion None  Danger to Self  Current suicidal ideation? Denies  Danger to Others  Danger to Others None reported or observed

## 2019-03-31 NOTE — Progress Notes (Signed)
Pt denied HI/SI/AVH.  Pt reported that she continues to feel anxious and depressed today.  RN actively listened to pt describe feelings.  RN administered medications as prescribed.  Pt did not appear to have any adverse reaction to medications.  RN will continue to monitor and provide support as needed.

## 2019-03-31 NOTE — [facility] Group Notes (Signed)
LCSW Group Therapy Note  03/31/2019   1100a  Type of Therapy and Topic:  Group Therapy: Anger Cues and Responses  Participation Level:  None   Description of Group:   In this group, patients learned how to recognize the physical, cognitive, emotional, and behavioral responses they have to anger-provoking situations.  They identified a recent time they became angry and how they reacted.  They analyzed how their reaction was possibly beneficial and how it was possibly unhelpful.  The group discussed a variety of healthier coping skills that could help with such a situation in the future.  Focus was placed on how helpful it is to recognize the underlying emotions to our anger, because working on those can lead to a more permanent solution as well as our ability to focus on the important rather than the urgent.  Therapeutic Goals: 1. Patients will remember their last incident of anger and how they felt emotionally and physically, what their thoughts were at the time, and how they behaved. 2. Patients will identify how their behavior at that time worked for them, as well as how it worked against them. 3. Patients will explore possible new behaviors to use in future anger situations. 4. Patients will learn that anger itself is normal and cannot be eliminated, and that healthier reactions can assist with resolving conflict rather than worsening situations.  Summary of Patient Progress:  The patient identified her thoughts and experiences with anger, sharing "I go into rage". Pt did not proved to engage further in group, providing no further feedback surrounding discussion of the last instance of anger, response to event, or alternate means of managing feelings.  Therapeutic Modalities:   Cognitive Behavioral Therapy    Micheline Maze 03/31/2019  3:23 PM

## 2019-03-31 NOTE — Progress Notes (Signed)
   03/30/19 2353  Psych Admission Type (Psych Patients Only)  Admission Status Voluntary  Psychosocial Assessment  Patient Complaints Anxiety  Facial Expression Flat  Affect Appropriate to circumstance  Speech Logical/coherent  Interaction Minimal  Appearance/Hygiene Disheveled  Behavior Characteristics Cooperative  Mood Anxious;Depressed  Thought Process  Coherency WDL  Content WDL  Delusions WDL  Perception WDL  Hallucination None reported or observed  Judgment WDL  Confusion WDL  Danger to Self  Current suicidal ideation? Denies  Danger to Others  Danger to Others None reported or observed  Patient not feeling well tonight due to withdrawals. CIWA=9 and COW=6

## 2019-03-31 NOTE — Progress Notes (Signed)
Adult Psychoeducational Group Note  Date:  03/31/2019 Time:  9:46 PM  Group Topic/Focus:  Wrap-Up Group:   The focus of this group is to help patients review their daily goal of treatment and discuss progress on daily workbooks.  Participation Level:  Did Not Attend    Additional Comments:  Pt did not attend wrap up group.   [name] Brayton Mars 03/31/2019, 9:46 PM

## 2019-03-31 NOTE — Progress Notes (Signed)
   03/31/19 2238  COVID-19 Daily Checkoff  Have you had a fever (temp > 37.80C/100F)  in the past 24 hours?  No  If you have had runny nose, nasal congestion, sneezing in the past 24 hours, has it worsened? No  COVID-19 EXPOSURE  Have you traveled outside the state in the past 14 days? No  Have you been in contact with someone with a confirmed diagnosis of COVID-19 or PUI in the past 14 days without wearing appropriate PPE? No  Have you been living in the same home as a person with confirmed diagnosis of COVID-19 or a PUI (household contact)? No  Have you been diagnosed with COVID-19? No

## 2019-04-01 DIAGNOSIS — F1520 Other stimulant dependence, uncomplicated: Secondary | ICD-10-CM | POA: Diagnosis not present

## 2019-04-01 DIAGNOSIS — F1994 Other psychoactive substance use, unspecified with psychoactive substance-induced mood disorder: Secondary | ICD-10-CM | POA: Diagnosis not present

## 2019-04-01 DIAGNOSIS — F1920 Other psychoactive substance dependence, uncomplicated: Secondary | ICD-10-CM | POA: Diagnosis not present

## 2019-04-01 DIAGNOSIS — F319 Bipolar disorder, unspecified: Secondary | ICD-10-CM | POA: Diagnosis not present

## 2019-04-01 NOTE — Progress Notes (Signed)
   04/01/19 2254  COVID-19 Daily Checkoff  Have you had a fever (temp > 37.80C/100F)  in the past 24 hours?  No  If you have had runny nose, nasal congestion, sneezing in the past 24 hours, has it worsened? No  COVID-19 EXPOSURE  Have you traveled outside the state in the past 14 days? No  Have you been in contact with someone with a confirmed diagnosis of COVID-19 or PUI in the past 14 days without wearing appropriate PPE? No  Have you been living in the same home as a person with confirmed diagnosis of COVID-19 or a PUI (household contact)? No  Have you been diagnosed with COVID-19? No

## 2019-04-01 NOTE — [facility] Group Notes (Signed)
[facility] Group Notes: (Clinical Social Work)   04/01/2019      Type of Therapy:  Group Therapy   Participation Level:  Did Not Attend - was invited by overhead announcement and individually by Nurse or MHT and chose not to attend.   Leisa Lenz, LCSWA 04/01/2019  11:38 AM

## 2019-04-01 NOTE — Progress Notes (Signed)
   04/01/19 2256  Psych Admission Type (Psych Patients Only)  Admission Status Voluntary  Psychosocial Assessment  Patient Complaints Anxiety  Eye Contact Brief  Facial Expression Anxious  Affect Appropriate to circumstance  Speech Logical/coherent  Interaction Minimal  Motor Activity Other (Comment) (WDL)  Appearance/Hygiene Unremarkable  Behavior Characteristics Appropriate to situation  Mood Anxious  Thought Process  Coherency WDL  Content WDL  Delusions None reported or observed  Perception WDL  Hallucination None reported or observed  Judgment Impaired  Confusion None  Danger to Self  Current suicidal ideation? Denies  Danger to Others  Danger to Others None reported or observed

## 2019-04-01 NOTE — Progress Notes (Signed)
   04/01/19 1200  Psych Admission Type (Psych Patients Only)  Admission Status Voluntary  Psychosocial Assessment  Patient Complaints Anxiety  Eye Contact Brief  Facial Expression Flat  Affect Appropriate to circumstance  Speech Logical/coherent  Interaction Minimal  Motor Activity Slow  Appearance/Hygiene Unremarkable  Behavior Characteristics Cooperative  Mood Depressed;Sad  Thought Process  Coherency WDL  Content WDL  Delusions None reported or observed  Perception WDL  Hallucination None reported or observed  Judgment Impaired  Confusion None  Danger to Self  Current suicidal ideation? Denies  Danger to Others  Danger to Others None reported or observed

## 2019-04-01 NOTE — Progress Notes (Signed)
[facility] MD Progress Note  04/01/2019 1:48 PM [name]  MRN:  [id number]  Subjective: [name] reports, "I feel very dizzy, dizzy headed. That is the reason I'm still in bed. I feel very depressed & tired".  Objective: Patient is a 39 year old female with a past psychiatric history significant for opiate dependence, methamphetamine dependence as well as benzodiazepine dependence who presented to the [facility] on 03/27/2019 with suicidal ideation.  [name] is seen, chart reviewed. The chart findings discussed with the treatment team. She presents alert, oriented & aware of situation. She is not visible on the unit this morning.  She says she is unable to attend group sessions this morning because she is feeling dizzy & depressed. She continues to complain of symptoms of depression. However, denies any SIHI, AVH, delusional thoughts or paranoia. She does not appear to be responding to any internal stimuli. Patient is encouraged to take her medications as recommended. She is in agreement to continue current plan of care as already in progress.  Principal Problem: Bipolar 1 disorder (HCC)  Diagnosis: Principal Problem:   Bipolar 1 disorder (HCC) Active Problems:   Suicide attempt (HCC)   Amphetamine use disorder, severe, dependence (HCC)  Total Time spent with patient: 15 minutes  Past Psychiatric History: See H&P  Past Medical History:  Past Medical History:  Diagnosis Date  . Anxiety    takes Xanax daily as needed  . Chronic back pain    herniated disc  . Chronic pelvic pain in female   . Depression    takes Lexapro daily  . Drug-seeking behavior   . Headache    constant  . History of bronchitis    yrs ago  . History of kidney stones   . Insomnia    takes Elavil nightly  . Nocturia   . Seasonal allergies    Claritin and FLonase daily as needed    Past Surgical History:  Procedure Laterality Date  . CHOLECYSTECTOMY    . I & D EXTREMITY Left 08/03/2016    Procedure: IRRIGATION AND DEBRIDEMENT LEFT HAND;  Surgeon: Dominica Severin, MD;  Location: [facility] ORS;  Service: Orthopedics;  Laterality: Left;  . THORACIC DISCECTOMY Right 03/28/2014   Procedure: RIGHT THORACIC ELEVEN-TWELVE MICRODICSECTOMY;  Surgeon: Tressie Stalker, MD;  Location: [facility];  Service: Neurosurgery;  Laterality: Right;  right  . TUBAL LIGATION     Family History:  Family History  Problem Relation Age of Onset  . Hypertension Mother   . Hypertension Father   . Hypertension Sister   . Stroke Other   . Cancer Other   . Colon cancer Neg Hx    Family Psychiatric  History: See H&P.  Social History:  Social History   Substance and Sexual Activity  Alcohol Use Yes   Comment: occ     Social History   Substance and Sexual Activity  Drug Use Yes  . Types: Marijuana, Methamphetamines    Social History   Socioeconomic History  . Marital status: Legally Separated    Spouse name: Not on file  . Number of children: 3  . Years of education: Not on file  . Highest education level: Not on file  Occupational History  . Not on file  Tobacco Use  . Smoking status: Current Every Day Smoker    Packs/day: 0.50    Years: 0.00    Pack years: 0.00    Types: Cigarettes  . Smokeless tobacco: Never Used  Substance and Sexual Activity  .  Alcohol use: Yes    Comment: occ  . Drug use: Yes    Types: Marijuana, Methamphetamines  . Sexual activity: Not on file  Other Topics Concern  . Not on file  Social History Narrative  . Not on file   Social Determinants of Health   Financial Resource Strain:   . Difficulty of Paying Living Expenses:   Food Insecurity:   . Worried About Programme researcher, broadcasting/film/video in the Last Year:   . Barista in the Last Year:   Transportation Needs:   . Freight forwarder (Medical):   Marland Kitchen Lack of Transportation (Non-Medical):   Physical Activity:   . Days of Exercise per Week:   . Minutes of Exercise per Session:   Stress:   . Feeling  of Stress :   Social Connections:   . Frequency of Communication with Friends and Family:   . Frequency of Social Gatherings with Friends and Family:   . Attends Religious Services:   . Active Member of Clubs or Organizations:   . Attends Banker Meetings:   Marland Kitchen Marital Status:    Additional Social History:   Sleep: Good  Appetite:  Good  Current Medications: Current Facility-Administered Medications  Medication Dose Route Frequency Provider Last Rate Last Admin  . acetaminophen (TYLENOL) tablet 650 mg  650 mg Oral Q6H PRN Antonieta Pert, MD   650 mg at 03/31/19 1848  . alum & mag hydroxide-simeth (MAALOX/MYLANTA) 200-200-20 MG/5ML suspension 30 mL  30 mL Oral Q4H PRN Antonieta Pert, MD      . buPROPion [facility] SR) 12 hr tablet 150 mg  150 mg Oral BID Antonieta Pert, MD   150 mg at 04/01/19 4627  . dicyclomine (BENTYL) tablet 20 mg  20 mg Oral Q6H PRN Antonieta Pert, MD   20 mg at 03/31/19 2120  . escitalopram (LEXAPRO) tablet 20 mg  20 mg Oral Daily Antonieta Pert, MD   20 mg at 04/01/19 0350  . hydrOXYzine (ATARAX/VISTARIL) tablet 25 mg  25 mg Oral Q4H PRN Antonieta Pert, MD   25 mg at 03/31/19 1837  . loperamide (IMODIUM) capsule 2-4 mg  2-4 mg Oral PRN Antonieta Pert, MD      . LORazepam (ATIVAN) tablet 1 mg  1 mg Oral Q6H PRN Antonieta Pert, MD   1 mg at 03/31/19 2120  . magnesium hydroxide (MILK OF MAGNESIA) suspension 30 mL  30 mL Oral Daily PRN Antonieta Pert, MD      . methocarbamol (ROBAXIN) tablet 500 mg  500 mg Oral Q8H PRN Antonieta Pert, MD   500 mg at 03/31/19 1837  . nicotine (NICODERM CQ - dosed in mg/24 hours) patch 14 mg  14 mg Transdermal Daily Antonieta Pert, MD   14 mg at 04/01/19 0938  . ondansetron (ZOFRAN-ODT) disintegrating tablet 4 mg  4 mg Oral Q8H PRN Antonieta Pert, MD   4 mg at 03/31/19 1838  . pneumococcal 23 valent vaccine (PNEUMOVAX-23) injection 0.5 mL  0.5 mL Intramuscular Tomorrow-1000  Antonieta Pert, MD      . traZODone (DESYREL) tablet 50 mg  50 mg Oral QHS,MR X 1 Nira Conn A, NP   50 mg at 03/31/19 2120   Lab Results:  No results found for this or any previous visit (from the past 48 hour(s)). Blood Alcohol level:  Lab Results  Component Value Date   ETH <10 03/27/2019  ETH <10 16/60/6301   Metabolic Disorder Labs: Lab Results  Component Value Date   HGBA1C 5.4 03/30/2019   MPG 108.28 03/30/2019   No results found for: PROLACTIN Lab Results  Component Value Date   CHOL 184 03/30/2019   TRIG 80 03/30/2019   HDL 53 03/30/2019   CHOLHDL 3.5 03/30/2019   VLDL 16 03/30/2019   LDLCALC 115 (H) 03/30/2019   Physical Findings: AIMS: Facial and Oral Movements Muscles of Facial Expression: None, normal Lips and Perioral Area: None, normal Jaw: None, normal Tongue: None, normal,Extremity Movements Upper (arms, wrists, hands, fingers): None, normal Lower (legs, knees, ankles, toes): None, normal, Trunk Movements Neck, shoulders, hips: None, normal, Overall Severity Severity of abnormal movements (highest score from questions above): None, normal Incapacitation due to abnormal movements: None, normal Patient's awareness of abnormal movements (rate only patient's report): No Awareness, Dental Status Current problems with teeth and/or dentures?: No Does patient usually wear dentures?: No  CIWA:  CIWA-Ar Total: 4 COWS:  COWS Total Score: 1  Musculoskeletal: Strength & Muscle Tone: within normal limits Gait & Station: normal Patient leans: N/A  Psychiatric Specialty Exam: Physical Exam  Nursing note and vitals reviewed. Constitutional: She is oriented to person, place, and time. She appears well-developed.  Cardiovascular: Normal rate.  Respiratory: Effort normal.  Genitourinary:    Genitourinary Comments: Deferred   Musculoskeletal:        General: Normal range of motion.     Cervical back: Normal range of motion.  Neurological: She is alert  and oriented to person, place, and time.  Skin: Skin is warm and dry.    Review of Systems  Constitutional: Negative for chills, diaphoresis and fever.  HENT: Negative for congestion, rhinorrhea, sneezing and sore throat.   Eyes: Negative for discharge.  Respiratory: Negative for chest tightness, shortness of breath and wheezing.   Cardiovascular: Negative for chest pain and palpitations.  Gastrointestinal: Negative for diarrhea, nausea and vomiting.  Endocrine: Negative for cold intolerance.  Genitourinary: Negative for difficulty urinating.  Musculoskeletal: Negative.   Allergic/Immunologic: Negative for environmental allergies and food allergies.       Allergie: Motrin, hydrocodone, Ibuprofen, Ultram & PCN  Neurological: Negative.   Psychiatric/Behavioral: Positive for dysphoric mood and sleep disturbance. Negative for agitation, behavioral problems, confusion, decreased concentration, hallucinations, self-injury and suicidal ideas. The patient is nervous/anxious. The patient is not hyperactive.     Blood pressure 92/71, pulse 85, temperature 99.6 F (37.6 C), resp. rate 20, height 5\' 3"  (1.6 m), weight 72.6 kg, last menstrual period 03/10/2019, SpO2 99 %.Body mass index is 28.34 kg/m.  General Appearance:Disheveled  Eye Contact:Fair  Speech:Normal Rate  Volume:Decreased  Mood:Anxious, Depressed and Dysphoric  Affect:Congruent  Thought Process:Coherent and Descriptions of Associations:Intact  Orientation:Full (Time, Place, and Person)  Thought Content:Logical  Suicidal Thoughts:No  Homicidal Thoughts:No  Memory:Immediate;Fair Recent;Fair Remote;Fair  Judgement:Intact  Insight:Fair  Psychomotor Activity:Increased  Concentration:Concentration:[place] Attention Span: [place]  Akathisia:Negative  Handed:Right  AIMS (if indicated):   Assets:Desire for  Improvement Resilience  ADL's:Intact  Cognition:WNL    Sleep:  Number of Hours: 6.75   Treatment Plan Summary: Daily contact with patient to assess and evaluate symptoms and progress in treatment and Medication management.  -Continue inpatient hospitalization.  -Will continue today 04/01/2019 plan as below except where it is noted.  -Depression   -Continue Lexapro 20 mg po daily.             -Continue Wellbutrin SR 150  mg po daily.  -Anxiety  -Continue atarax 25 mg po q4h prn.             -Continue Lorazepam 1 mg po Q 6 hrs prn for CIWA >10.  -Insomnia  -Continue Trazodone 50 mg po prn qhs, may repeat x 1.  -Opioid withdrawal.             -Continue Bentyl 20 mg po Q 6 hrs prn.             -Continue Zofran 4 mg po Q 6 hrs prn for nausea.             -Continue Imodium 2-4 mg po prn for diarrhea.             -Continue Robaxin 500 mg po Q 8 hrs prn.             -Nicotine withdrawal.              -Continue Nicoderm patch 14 mg transdermally Q 24 hrs.     -Encourage participation in groups and therapeutic milieu  -Disposition planning will be ongoing  Armandina Stammer, NP, PMHNP, FNP-BC 04/01/2019, 1:48 PMPatient ID: [name], female   DOB: Nov 29, 1980, 39 y.o.   MRN: [id number]

## 2019-04-02 ENCOUNTER — Telehealth: Payer: Self-pay | Admitting: Family Medicine

## 2019-04-02 MED ORDER — ESCITALOPRAM OXALATE 10 MG PO TABS
30.0000 mg | ORAL_TABLET | Freq: Every day | ORAL | Status: DC
  Administered 2019-04-03 – 2019-04-05 (×3): 30 mg via ORAL
  Filled 2019-04-02 (×5): qty 3

## 2019-04-02 MED ORDER — ESCITALOPRAM OXALATE 10 MG PO TABS
10.0000 mg | ORAL_TABLET | Freq: Every day | ORAL | Status: DC
  Administered 2019-04-02: 10 mg via ORAL
  Filled 2019-04-02 (×3): qty 1

## 2019-04-02 NOTE — Telephone Encounter (Signed)
Provider would like all Xanax prescription and refills to be canceled due to patient currently in hospital for semi overdose. Contacted pharmacy to cancel but they do not open until 8:30 am. Will call back to cancel all refills.

## 2019-04-02 NOTE — [facility] Group Notes (Signed)
LCSW Group Therapy Note   04/02/2019 2:38 PM  Type of Therapy and Topic:  Group Therapy:  Overcoming Obstacles   Participation Level:  Did Not Attend   Description of Group:    In this group patients will be encouraged to explore what they see as obstacles to their own wellness and recovery. They will be guided to discuss their thoughts, feelings, and behaviors related to these obstacles. The group will process together ways to cope with barriers, with attention given to specific choices patients can make. Each patient will be challenged to identify changes they are motivated to make in order to overcome their obstacles. This group will be process-oriented, with patients participating in exploration of their own experiences as well as giving and receiving support and challenge from other group members.   Therapeutic Goals: 1. Patient will identify personal and current obstacles as they relate to admission. 2. Patient will identify barriers that currently interfere with their wellness or overcoming obstacles.  3. Patient will identify feelings, thought process and behaviors related to these barriers. 4. Patient will identify two changes they are willing to make to overcome these obstacles:      Summary of Patient Progress X   Therapeutic Modalities:   Cognitive Behavioral Therapy Solution Focused Therapy Motivational Interviewing Relapse Prevention Therapy  Penni Homans, MSW, LCSW 04/02/2019 2:38 PM

## 2019-04-02 NOTE — Telephone Encounter (Signed)
Spoke with [name] (Tammy) and cancelled all Xanax refills. Pt did pick up a script on 03/23/19.

## 2019-04-02 NOTE — Progress Notes (Signed)
Recreation Therapy Notes  Date:  3.29.21 Time: 0930 Location: [address] Group Room  Group Topic: Stress Management  Goal Area(s) Addresses:  Patient will identify positive stress management techniques. Patient will identify benefits of using stress management post d/c.  Intervention: Stress Management  Activity : Meditation.  LRT played a meditation that focused on letting go of the past and living in the moment.  Patients were to listen and follow along as meditation played to engage in activity.  Education:  Stress Management, Discharge Planning.   Education Outcome: Acknowledges Education  Clinical Observations/Feedback: Pt did not attend activity.   Keola Heninger, LRT/CTRS         Chetan Mehring A 04/02/2019 11:03 AM 

## 2019-04-02 NOTE — Progress Notes (Signed)
Adult Psychoeducational Group Note  Date:  04/02/2019 Time:  10:55 PM  Group Topic/Focus:  Wrap-Up Group:   The focus of this group is to help patients review their daily goal of treatment and discuss progress on daily workbooks.  Participation Level:  Active  Participation Quality:  Appropriate  Affect:  Appropriate  Cognitive:  Appropriate  Insight: Appropriate  Engagement in Group:  Engaged  Modes of Intervention:  Discussion  Additional Comments:  Patient attended attended group and said her day was a 9. She also was excited about the Music theory that they had today.   Tomeeka Plaugher W Lanitra Battaglini 04/02/2019, 10:55 PM

## 2019-04-02 NOTE — Progress Notes (Signed)
[facility] MD Progress Note  04/02/2019 10:51 AM [name]  MRN:  [id number] Subjective: Patient is a 39 year old female with a past psychiatric history significant for opiate dependence, methamphetamine dependence as well as benzodiazepine dependence who presented to the [facility] emergency department on 03/27/2019 with suicidal ideation.  She was requesting detox from methamphetamines as well as heroin.  Objective: Patient is seen and examined.  Patient is a 39 year old female with the above-stated past psychiatric history who is seen in follow-up.  She still appears to be in fairly significant withdrawal.  She stated her mood was down, and that she was depressed and anxious.  She was yawning throughout the interview (and indicative sign of heroin withdrawal).  She believes that by increasing her antidepressant medications it will improve her mood.  We discussed the fact that much of her withdrawal syndrome at this point would lead to worsening depression and anxiety, and I had told her that we would increase her Lexapro, but that I felt as though most likely it would have no benefit in the short run.  She continues to request long-term substance abuse treatment residential programs.  I know that social work is working on that.  She stated that her primary care provider was writing her with Xanax, and she was requesting benzodiazepines.  I told her we would be unable to do that, and that no residential program would take her on something like that.  She also wanted to know whether or not we would prescribe her Suboxone, and I told her that we would not start anything like that now, and unless that she had a clinic that she knew she would be able to receive it but it would probably not be started.  Her vital signs are stable, she is afebrile.  Nursing notes reflect that she slept 6.75 hours.  No new laboratories.  Principal Problem: Bipolar 1 disorder (HCC) Diagnosis: Principal  Problem:   Bipolar 1 disorder ([place]) Active Problems:   Suicide attempt ([place])   Amphetamine use disorder, severe, dependence ([place])  Total Time spent with patient: 20 minutes  Past Psychiatric History: See admission H&P  Past Medical History:  Past Medical History:  Diagnosis Date  . Anxiety    takes Xanax daily as needed  . Chronic back pain    herniated disc  . Chronic pelvic pain in female   . Depression    takes Lexapro daily  . Drug-seeking behavior   . Headache    constant  . History of bronchitis    yrs ago  . History of kidney stones   . Insomnia    takes Elavil nightly  . Nocturia   . Seasonal allergies    Claritin and FLonase daily as needed    Past Surgical History:  Procedure Laterality Date  . CHOLECYSTECTOMY    . I & D EXTREMITY Left 08/03/2016   Procedure: IRRIGATION AND DEBRIDEMENT LEFT HAND;  Surgeon: Roseanne Kaufman, MD;  Location: [facility];  Service: Orthopedics;  Laterality: Left;  . THORACIC DISCECTOMY Right 03/28/2014   Procedure: RIGHT THORACIC ELEVEN-TWELVE MICRODICSECTOMY;  Surgeon: Newman Pies, MD;  Location: [facility];  Service: Neurosurgery;  Laterality: Right;  right  . TUBAL LIGATION     Family History:  Family History  Problem Relation Age of Onset  . Hypertension Mother   . Hypertension Father   . Hypertension Sister   . Stroke Other   . Cancer Other   . Colon cancer Neg Hx  Family Psychiatric  History: See admission H&P Social History:  Social History   Substance and Sexual Activity  Alcohol Use Yes   Comment: occ     Social History   Substance and Sexual Activity  Drug Use Yes  . Types: Marijuana, Methamphetamines    Social History   Socioeconomic History  . Marital status: Legally Separated    Spouse name: Not on file  . Number of children: 3  . Years of education: Not on file  . Highest education level: Not on file  Occupational History  . Not on file  Tobacco Use  . Smoking status: Current Every Day  Smoker    Packs/day: 0.50    Years: 0.00    Pack years: 0.00    Types: Cigarettes  . Smokeless tobacco: Never Used  Substance and Sexual Activity  . Alcohol use: Yes    Comment: occ  . Drug use: Yes    Types: Marijuana, Methamphetamines  . Sexual activity: Not on file  Other Topics Concern  . Not on file  Social History Narrative  . Not on file   Social Determinants of Health   Financial Resource Strain:   . Difficulty of Paying Living Expenses:   Food Insecurity:   . Worried About Programme researcher, broadcasting/film/video in the Last Year:   . Barista in the Last Year:   Transportation Needs:   . Freight forwarder (Medical):   Marland Kitchen Lack of Transportation (Non-Medical):   Physical Activity:   . Days of Exercise per Week:   . Minutes of Exercise per Session:   Stress:   . Feeling of Stress :   Social Connections:   . Frequency of Communication with Friends and Family:   . Frequency of Social Gatherings with Friends and Family:   . Attends Religious Services:   . Active Member of Clubs or Organizations:   . Attends Banker Meetings:   Marland Kitchen Marital Status:    Additional Social History:                         Sleep: Good  Appetite:  Fair  Current Medications: Current Facility-Administered Medications  Medication Dose Route Frequency Provider Last Rate Last Admin  . acetaminophen (TYLENOL) tablet 650 mg  650 mg Oral Q6H PRN Antonieta Pert, MD   650 mg at 04/01/19 1739  . alum & mag hydroxide-simeth (MAALOX/MYLANTA) 200-200-20 MG/5ML suspension 30 mL  30 mL Oral Q4H PRN Antonieta Pert, MD      . buPROPion [facility] SR) 12 hr tablet 150 mg  150 mg Oral BID Antonieta Pert, MD   150 mg at 04/02/19 0940  . dicyclomine (BENTYL) tablet 20 mg  20 mg Oral Q6H PRN Antonieta Pert, MD   20 mg at 04/01/19 1738  . escitalopram (LEXAPRO) tablet 10 mg  10 mg Oral Daily Antonieta Pert, MD      . Melene Muller ON 04/03/2019] escitalopram (LEXAPRO) tablet 30 mg   30 mg Oral Daily Antonieta Pert, MD      . hydrOXYzine (ATARAX/VISTARIL) tablet 25 mg  25 mg Oral Q4H PRN Antonieta Pert, MD   25 mg at 04/01/19 1738  . loperamide (IMODIUM) capsule 2-4 mg  2-4 mg Oral PRN Antonieta Pert, MD      . magnesium hydroxide (MILK OF MAGNESIA) suspension 30 mL  30 mL Oral Daily PRN Antonieta Pert, MD      .  methocarbamol (ROBAXIN) tablet 500 mg  500 mg Oral Q8H PRN Antonieta Pert, MD   500 mg at 04/01/19 1738  . nicotine (NICODERM CQ - dosed in mg/24 hours) patch 14 mg  14 mg Transdermal Daily Antonieta Pert, MD   14 mg at 04/02/19 0941  . ondansetron (ZOFRAN-ODT) disintegrating tablet 4 mg  4 mg Oral Q8H PRN Antonieta Pert, MD   4 mg at 04/01/19 1739  . pneumococcal 23 valent vaccine (PNEUMOVAX-23) injection 0.5 mL  0.5 mL Intramuscular Tomorrow-1000 Antonieta Pert, MD      . traZODone (DESYREL) tablet 50 mg  50 mg Oral QHS,MR X 1 Nira Conn A, NP   50 mg at 04/01/19 2103    Lab Results: No results found for this or any previous visit (from the past 48 hour(s)).  Blood Alcohol level:  Lab Results  Component Value Date   ETH <10 03/27/2019   ETH <10 03/26/2019    Metabolic Disorder Labs: Lab Results  Component Value Date   HGBA1C 5.4 03/30/2019   MPG 108.28 03/30/2019   No results found for: PROLACTIN Lab Results  Component Value Date   CHOL 184 03/30/2019   TRIG 80 03/30/2019   HDL 53 03/30/2019   CHOLHDL 3.5 03/30/2019   VLDL 16 03/30/2019   LDLCALC 115 (H) 03/30/2019    Physical Findings: AIMS: Facial and Oral Movements Muscles of Facial Expression: None, normal Lips and Perioral Area: None, normal Jaw: None, normal Tongue: None, normal,Extremity Movements Upper (arms, wrists, hands, fingers): None, normal Lower (legs, knees, ankles, toes): None, normal, Trunk Movements Neck, shoulders, hips: None, normal, Overall Severity Severity of abnormal movements (highest score from questions above): None,  normal Incapacitation due to abnormal movements: None, normal Patient's awareness of abnormal movements (rate only patient's report): No Awareness, Dental Status Current problems with teeth and/or dentures?: No Does patient usually wear dentures?: No  CIWA:  CIWA-Ar Total: 4 COWS:  COWS Total Score: 3  Musculoskeletal: Strength & Muscle Tone: within normal limits Gait & Station: normal Patient leans: N/A  Psychiatric Specialty Exam: Physical Exam  Nursing note and vitals reviewed. Constitutional: She is oriented to person, place, and time. She appears well-developed and well-nourished.  HENT:  Head: Normocephalic and atraumatic.  Respiratory: Effort normal.  Neurological: She is alert and oriented to person, place, and time.    Review of Systems  Blood pressure 96/74, pulse 83, temperature 99.6 F (37.6 C), resp. rate 20, height 5\' 3"  (1.6 m), weight 72.6 kg, last menstrual period 03/10/2019, SpO2 99 %.Body mass index is 28.34 kg/m.  General Appearance: Disheveled  Eye Contact:  Fair  Speech:  Normal Rate  Volume:  Decreased  Mood:  Depressed and Dysphoric  Affect:  Congruent  Thought Process:  Coherent and Descriptions of Associations: Circumstantial  Orientation:  Full (Time, Place, and Person)  Thought Content:  Logical  Suicidal Thoughts:  No  Homicidal Thoughts:  No  Memory:  Immediate;   Fair Recent;   Fair Remote;   Fair  Judgement:  Impaired  Insight:  Lacking  Psychomotor Activity:  Decreased  Concentration:  Concentration: Fair and Attention Span: Fair  Recall:  05/10/2019 of Knowledge:  Fair  Language:  Good  Akathisia:  Negative  Handed:  Right  AIMS (if indicated):     Assets:  Desire for Improvement Resilience  ADL's:  Intact  Cognition:  WNL  Sleep:  Number of Hours: 6.75     Treatment Plan Summary:  Daily contact with patient to assess and evaluate symptoms and progress in treatment, Medication management and Plan : Patient is seen and  examined.  Patient is a 39 year old female with the above-stated past psychiatric history who is seen in follow-up.   Diagnosis: #1 substance-induced mood disorder, #2 opiate/heroin dependence, #3 amphetamine dependence, #4 benzodiazepine dependence, #5 opiate withdrawal, #6 benzodiazepine withdrawal, #7 amphetamine withdrawal  Patient is seen in follow-up.  She is feeling better than when I saw her on Friday.  She is having mild symptoms of withdrawal.  She is not showing any signs or symptoms of benzodiazepine withdrawal, and I am going to stop the lorazepam today.  We will continue the opiate detox protocol.  Social work is looking for residential treatment.  I will increase her Lexapro to 30 mg a day, but I suspect that a will probably have minimal benefit.  She is requesting benzodiazepines, and I told her we would not be able to do that, and as well she would not be able to get into any kind of a residential substance abuse treatment program on any controlled substances.  She was also asking about whether or not she might be able to have Suboxone.  And I told her that we would not start that here, and she have to find out about that once she was in the residential treatment program.  No other changes in her medications at this point.  1.  Continue bupropion SR 150 mg p.o. twice daily for depression and anxiety. 2.  Continue opiate detox protocol. 3.  Increase Lexapro to 30 mg p.o. daily for depression and anxiety. 4.  Stop lorazepam. 5.  Continue trazodone 50 mg p.o. nightly as needed insomnia. 6.  Patient is requesting substance abuse residential treatment program. 7.  Disposition planning-in progress.  Antonieta Pert, MD 04/02/2019, 10:51 AM

## 2019-04-02 NOTE — Progress Notes (Signed)
D:  Patient denied SI and HI, contracts for safety.  Denied A/V hallucinations.   A:  Medications administered per MD orders.  Emotional support and encouragement given patient. R:  Safety maintained with 15 minute checks.  

## 2019-04-03 DIAGNOSIS — F319 Bipolar disorder, unspecified: Secondary | ICD-10-CM | POA: Diagnosis not present

## 2019-04-03 MED ORDER — TRAZODONE HCL 50 MG PO TABS
50.0000 mg | ORAL_TABLET | Freq: Every evening | ORAL | Status: DC | PRN
  Administered 2019-04-03 – 2019-04-04 (×2): 50 mg via ORAL
  Filled 2019-04-03 (×2): qty 1

## 2019-04-03 MED ORDER — BUPROPION HCL ER (SR) 150 MG PO TB12
150.0000 mg | ORAL_TABLET | Freq: Every day | ORAL | Status: DC
  Administered 2019-04-04 – 2019-04-05 (×2): 150 mg via ORAL
  Filled 2019-04-03 (×4): qty 1

## 2019-04-03 NOTE — Progress Notes (Signed)
Pt stated that she is feeling better today and she has more energy than previous days.  Pt stated that she does not have SI/HI/AVH.  Pt was participating in group meetings and interacting with peers and staff. No adverse reactions to medications were noted. Pt is safe on the unit at this time.  RN will continue to monitor and provide support as needed.     04/03/19 0830  Psych Admission Type (Psych Patients Only)  Admission Status Voluntary  Psychosocial Assessment  Patient Complaints Sleep disturbance  Eye Contact Fair  Facial Expression Anxious  Affect Appropriate to circumstance  Speech Logical/coherent  Interaction Other (Comment);Attention-seeking (pleasant)  Motor Activity Other (Comment) (WNL)  Appearance/Hygiene Unremarkable  Behavior Characteristics Cooperative;Appropriate to situation;Anxious  Mood Anxious  Thought Process  Coherency WDL  Content WDL  Delusions WDL;None reported or observed  Perception WDL  Hallucination None reported or observed  Judgment Poor  Confusion None  Danger to Self  Current suicidal ideation? Denies  Danger to Others  Danger to Others None reported or observed

## 2019-04-03 NOTE — Progress Notes (Signed)
[facility] MD Progress Note  04/03/2019 12:54 PM [name]  MRN:  [id number] Subjective: Patient reports she is feeling better. States " I feel more like the old me". She reports ongoing symptoms, to include a subjective sense of racing thoughts, particularly at nighttime , and ongoing insomnia. She denies suicidal ideations and presents future oriented, hoping to discharge soon. Although denies medication side effects, states she feels Wellbutrin SR ( which she reports she has been on for more than a year) causes her to feel vaguely anxious/ " like a strange feeling " particularly QPM dose . Of note, denies history of seizures .    Objective: I have reviewed chart notes, have discussed case with treatment team and met with patient. Patient is a 39 year old female , presented to ED on 3/22 with sedation, following overdose ( Xanax ), which she reports she does not remember. States she suspects she was given some other substance/drugs by people she was with that day.  She also requested help with detoxification from heroin and methamphetamines.  Admission UDS positive for BZDs, Amphetamines, Cannabis    Currently  reports she is feeling better and more like her usual self .  Denies current WDL symptoms and presents calm and in no acute distress.  She is currently on Lexapro 30 mgrs QDAY and Wellbutrin SR 150 mgrs BID. As above, she states that although she has no specific side effects, she does not like how the afternoon/QPM Wellbutrin SR dose makes her feel. It may also be contributing to insomnia. She denies withdrawal symptoms and presents calm, in no acute distress or discomfort. BP 114/62, pulse 64. Visible in day room, participating in groups,  behavior on unit in good control.   Principal Problem: Bipolar 1 disorder (HCC) Diagnosis: Principal Problem:   Bipolar 1 disorder ([place]) Active Problems:   Suicide attempt ([place])   Amphetamine use disorder, severe, dependence ([place])  Total Time spent  with patient: 20 minutes  Past Psychiatric History: See admission H&P  Past Medical History:  Past Medical History:  Diagnosis Date  . Anxiety    takes Xanax daily as needed  . Chronic back pain    herniated disc  . Chronic pelvic pain in female   . Depression    takes Lexapro daily  . Drug-seeking behavior   . Headache    constant  . History of bronchitis    yrs ago  . History of kidney stones   . Insomnia    takes Elavil nightly  . Nocturia   . Seasonal allergies    Claritin and FLonase daily as needed    Past Surgical History:  Procedure Laterality Date  . CHOLECYSTECTOMY    . I & D EXTREMITY Left 08/03/2016   Procedure: IRRIGATION AND DEBRIDEMENT LEFT HAND;  Surgeon: Roseanne Kaufman, MD;  Location: [facility];  Service: Orthopedics;  Laterality: Left;  . THORACIC DISCECTOMY Right 03/28/2014   Procedure: RIGHT THORACIC ELEVEN-TWELVE MICRODICSECTOMY;  Surgeon: Newman Pies, MD;  Location: [facility];  Service: Neurosurgery;  Laterality: Right;  right  . TUBAL LIGATION     Family History:  Family History  Problem Relation Age of Onset  . Hypertension Mother   . Hypertension Father   . Hypertension Sister   . Stroke Other   . Cancer Other   . Colon cancer Neg Hx    Family Psychiatric  History: See admission H&P Social History:  Social History   Substance and Sexual Activity  Alcohol Use Yes  Comment: occ     Social History   Substance and Sexual Activity  Drug Use Yes  . Types: Marijuana, Methamphetamines    Social History   Socioeconomic History  . Marital status: Legally Separated    Spouse name: Not on file  . Number of children: 3  . Years of education: Not on file  . Highest education level: Not on file  Occupational History  . Not on file  Tobacco Use  . Smoking status: Current Every Day Smoker    Packs/day: 0.50    Years: 0.00    Pack years: 0.00    Types: Cigarettes  . Smokeless tobacco: Never Used  Substance and Sexual Activity  .  Alcohol use: Yes    Comment: occ  . Drug use: Yes    Types: Marijuana, Methamphetamines  . Sexual activity: Not on file  Other Topics Concern  . Not on file  Social History Narrative  . Not on file   Social Determinants of Health   Financial Resource Strain:   . Difficulty of Paying Living Expenses:   Food Insecurity:   . Worried About Charity fundraiser in the Last Year:   . Arboriculturist in the Last Year:   Transportation Needs:   . Film/video editor (Medical):   Marland Kitchen Lack of Transportation (Non-Medical):   Physical Activity:   . Days of Exercise per Week:   . Minutes of Exercise per Session:   Stress:   . Feeling of Stress :   Social Connections:   . Frequency of Communication with Friends and Family:   . Frequency of Social Gatherings with Friends and Family:   . Attends Religious Services:   . Active Member of Clubs or Organizations:   . Attends Archivist Meetings:   Marland Kitchen Marital Status:    Additional Social History:   Sleep: Fair  Appetite:  improving   Current Medications: Current Facility-Administered Medications  Medication Dose Route Frequency Provider Last Rate Last Admin  . acetaminophen (TYLENOL) tablet 650 mg  650 mg Oral Q6H PRN Sharma Covert, MD   650 mg at 04/03/19 0539  . alum & mag hydroxide-simeth (MAALOX/MYLANTA) 200-200-20 MG/5ML suspension 30 mL  30 mL Oral Q4H PRN Sharma Covert, MD      . buPROPion [facility] SR) 12 hr tablet 150 mg  150 mg Oral BID Sharma Covert, MD   150 mg at 04/03/19 0819  . dicyclomine (BENTYL) tablet 20 mg  20 mg Oral Q6H PRN Sharma Covert, MD   20 mg at 04/02/19 1232  . escitalopram (LEXAPRO) tablet 30 mg  30 mg Oral Daily Sharma Covert, MD   30 mg at 04/03/19 7673  . hydrOXYzine (ATARAX/VISTARIL) tablet 25 mg  25 mg Oral Q4H PRN Sharma Covert, MD   25 mg at 04/02/19 2110  . loperamide (IMODIUM) capsule 2-4 mg  2-4 mg Oral PRN Sharma Covert, MD      . magnesium hydroxide  (MILK OF MAGNESIA) suspension 30 mL  30 mL Oral Daily PRN Sharma Covert, MD      . methocarbamol (ROBAXIN) tablet 500 mg  500 mg Oral Q8H PRN Sharma Covert, MD   500 mg at 04/02/19 1232  . nicotine (NICODERM CQ - dosed in mg/24 hours) patch 14 mg  14 mg Transdermal Daily Sharma Covert, MD   14 mg at 04/03/19 4193  . ondansetron (ZOFRAN-ODT) disintegrating tablet 4 mg  4  mg Oral Q8H PRN Sharma Covert, MD   4 mg at 04/02/19 1233  . traZODone (DESYREL) tablet 50 mg  50 mg Oral QHS,MR X 1 Lindon Romp A, NP   50 mg at 04/02/19 2110    Lab Results: No results found for this or any previous visit (from the past 48 hour(s)).  Blood Alcohol level:  Lab Results  Component Value Date   ETH <10 03/27/2019   ETH <10 28/00/3491    Metabolic Disorder Labs: Lab Results  Component Value Date   HGBA1C 5.4 03/30/2019   MPG 108.28 03/30/2019   No results found for: PROLACTIN Lab Results  Component Value Date   CHOL 184 03/30/2019   TRIG 80 03/30/2019   HDL 53 03/30/2019   CHOLHDL 3.5 03/30/2019   VLDL 16 03/30/2019   LDLCALC 115 (H) 03/30/2019    Physical Findings: AIMS: Facial and Oral Movements Muscles of Facial Expression: None, normal Lips and Perioral Area: None, normal Jaw: None, normal Tongue: None, normal,Extremity Movements Upper (arms, wrists, hands, fingers): None, normal Lower (legs, knees, ankles, toes): None, normal, Trunk Movements Neck, shoulders, hips: None, normal, Overall Severity Severity of abnormal movements (highest score from questions above): None, normal Incapacitation due to abnormal movements: None, normal Patient's awareness of abnormal movements (rate only patient's report): No Awareness, Dental Status Current problems with teeth and/or dentures?: No Does patient usually wear dentures?: No  CIWA:  CIWA-Ar Total: 4 COWS:  COWS Total Score: 2  Musculoskeletal: Strength & Muscle Tone: within normal limits Gait & Station: normal Patient  leans: N/A  Psychiatric Specialty Exam: Physical Exam  Nursing note and vitals reviewed. Constitutional: She is oriented to person, place, and time. She appears well-developed and well-nourished.  HENT:  Head: Normocephalic and atraumatic.  Respiratory: Effort normal.  Neurological: She is alert and oriented to person, place, and time.    Review of Systems no headache, no chest pain, no cough, no shortness of breath, no vomiting , no rash   Blood pressure (!) 88/70, pulse 72, temperature 98.4 F (36.9 C), temperature source Oral, resp. rate 20, height '5\' 3"'  (1.6 m), weight 72.6 kg, last menstrual period 03/10/2019, SpO2 100 %.Body mass index is 28.34 kg/m.  General Appearance: improving grooming   Eye Contact:  Good  Speech:  Normal Rate  Volume:  Normal  Mood:  reports improving mood compared to admission  Affect:  reactive, smiles at times appropriately  Thought Process:  Linear and Descriptions of Associations: Intact  Orientation:  Other:  fully alert and attentive  Thought Content:  no hallucinations, no delusions, not internally preoccupied   Suicidal Thoughts:  No currently denies suicidal or self injurious ideations, denies homicidal or violent ideations, contracts for safety on unit   Homicidal Thoughts:  No  Memory:  recent and remote grossly intact   Judgement:  Other:  improving   Insight:  Fair/ improving   Psychomotor Activity:  Normal  Concentration:  Concentration: Good and Attention Span: Good  Recall:  Good  Fund of Knowledge:  Good  Language:  Good  Akathisia:  Negative  Handed:  Right  AIMS (if indicated):     Assets:  Desire for Improvement Resilience  ADL's:  Intact  Cognition:  WNL  Sleep:  Number of Hours: 5.25   Assessment:  Patient is a 39 year old female , presented to ED on 3/22 with sedation, following overdose ( Xanax ), which she reports she does not remember. States she suspects she was given some  other substance/drugs by people she was with  that day.  She also requested help with detoxification from heroin and methamphetamines.  Admission UDS positive for BZDs, Amphetamines, Cannabis.  Currently patient reports feeling better than she did on admission.  She denies significant withdrawal symptoms at this time.  She appears calm/comfortable and in no acute distress.  She also describes improving mood, denies SI and presents future oriented at this time.  Although denies any specific side effect she states that p.m. dose of Wellbutrin SR causes her to feel vaguely overmedicated.  She also complains of residual insomnia and it is possible that p.m./evening bupropion dose could be contributing.  Treatment Plan Summary: Treatment Plan reviewed as below today 3/30 Encourage efforts to work on sobriety and relapse prevention Encourage group and milieu participation Treatment team working on disposition planning options Decrease Wellbutrin SR to 150 mg QAM for depression Continue Lexapro 30 mg p.o. daily for depression and anxiety. Continue Trazodone 50 mg p.o. nightly as needed for insomnia. Recheck hepatic function test in a.m.(ALT was mildly elevated/trending up on admission)  Jenne Campus, MD 04/03/2019, 12:54 PM    Patient ID: [name], female   DOB: 09/22/1980, 39 y.o.   MRN: [id number]

## 2019-04-03 NOTE — Progress Notes (Signed)
Adult Psychoeducational Group Note  Date:  04/03/2019 Time:  9:29 PM  Group Topic/Focus:  Wrap-Up Group:   The focus of this group is to help patients review their daily goal of treatment and discuss progress on daily workbooks.  Participation Level:  Active  Participation Quality:  Appropriate  Affect:  Appropriate  Cognitive:  Appropriate  Insight: Appropriate  Engagement in Group:  Engaged  Modes of Intervention:  Discussion  Additional Comments:  Patient attended group and participated.   Cherri Yera W Muhamed Luecke 04/03/2019, 9:29 PM

## 2019-04-04 DIAGNOSIS — F319 Bipolar disorder, unspecified: Secondary | ICD-10-CM | POA: Diagnosis not present

## 2019-04-04 LAB — HEPATIC FUNCTION PANEL
ALT: 65 U/L — ABNORMAL HIGH (ref 0–44)
AST: 33 U/L (ref 15–41)
Albumin: 3.5 g/dL (ref 3.5–5.0)
Alkaline Phosphatase: 45 U/L (ref 38–126)
Bilirubin, Direct: 0.1 mg/dL (ref 0.0–0.2)
Indirect Bilirubin: 0.4 mg/dL (ref 0.3–0.9)
Total Bilirubin: 0.5 mg/dL (ref 0.3–1.2)
Total Protein: 6.9 g/dL (ref 6.5–8.1)

## 2019-04-04 MED ORDER — LORAZEPAM 0.5 MG PO TABS
0.5000 mg | ORAL_TABLET | ORAL | Status: AC
  Administered 2019-04-04: 0.5 mg via ORAL

## 2019-04-04 MED ORDER — LORAZEPAM 0.5 MG PO TABS
ORAL_TABLET | ORAL | Status: AC
  Filled 2019-04-04: qty 1

## 2019-04-04 NOTE — Progress Notes (Signed)
[facility] MD Progress Note  04/04/2019 12:44 PM JAMIEKA ROYLE  MRN:  [id number] Subjective: Patient describes gradual improvement compared to admission.  As she improves she is becoming more future oriented and focusing on disposition planning options.  Currently denies medication side effects.  Denies SI.  Objective: I have reviewed chart notes, have discussed case with treatment team and met with patient. Patient is a 39 year old female , presented to ED on 3/22 with sedation, following overdose ( Xanax ), which she reports she does not remember. States she suspects she was given some other substance/drugs by people she was with that day.  She also requested help with detoxification from heroin and methamphetamines.  Admission UDS positive for BZDs, Amphetamines, Cannabis    Today patient presents alert, attentive, calm, pleasant on approach. Describes gradual/ongoing improvement and states she feels better than she did on admission. Currently denies suicidal ideations and presents future oriented. She reports her mood improved significantly after she found out that her boyfriend is currently doing "okay".  States he had recently overdosed and she had found him unresponsive.   Denies medication side effects. She is visible in dayroom, going to some groups.  No disruptive or agitated behaviors. Labs reviewed-AST 33, ALT slightly elevated at 65.    Principal Problem: Bipolar 1 disorder (HCC) Diagnosis: Principal Problem:   Bipolar 1 disorder ([place]) Active Problems:   Suicide attempt ([place])   Amphetamine use disorder, severe, dependence ([place])  Total Time spent with patient: 15 minutes  Past Psychiatric History: See admission H&P  Past Medical History:  Past Medical History:  Diagnosis Date  . Anxiety    takes Xanax daily as needed  . Chronic back pain    herniated disc  . Chronic pelvic pain in female   . Depression    takes Lexapro daily  . Drug-seeking behavior   . Headache     constant  . History of bronchitis    yrs ago  . History of kidney stones   . Insomnia    takes Elavil nightly  . Nocturia   . Seasonal allergies    Claritin and FLonase daily as needed    Past Surgical History:  Procedure Laterality Date  . CHOLECYSTECTOMY    . I & D EXTREMITY Left 08/03/2016   Procedure: IRRIGATION AND DEBRIDEMENT LEFT HAND;  Surgeon: Roseanne Kaufman, MD;  Location: [facility];  Service: Orthopedics;  Laterality: Left;  . THORACIC DISCECTOMY Right 03/28/2014   Procedure: RIGHT THORACIC ELEVEN-TWELVE MICRODICSECTOMY;  Surgeon: Newman Pies, MD;  Location: [facility];  Service: Neurosurgery;  Laterality: Right;  right  . TUBAL LIGATION     Family History:  Family History  Problem Relation Age of Onset  . Hypertension Mother   . Hypertension Father   . Hypertension Sister   . Stroke Other   . Cancer Other   . Colon cancer Neg Hx    Family Psychiatric  History: See admission H&P Social History:  Social History   Substance and Sexual Activity  Alcohol Use Yes   Comment: occ     Social History   Substance and Sexual Activity  Drug Use Yes  . Types: Marijuana, Methamphetamines    Social History   Socioeconomic History  . Marital status: Legally Separated    Spouse name: Not on file  . Number of children: 3  . Years of education: Not on file  . Highest education level: Not on file  Occupational History  . Not on file  Tobacco Use  . Smoking status: Current Every Day Smoker    Packs/day: 0.50    Years: 0.00    Pack years: 0.00    Types: Cigarettes  . Smokeless tobacco: Never Used  Substance and Sexual Activity  . Alcohol use: Yes    Comment: occ  . Drug use: Yes    Types: Marijuana, Methamphetamines  . Sexual activity: Not on file  Other Topics Concern  . Not on file  Social History Narrative  . Not on file   Social Determinants of Health   Financial Resource Strain:   . Difficulty of Paying Living Expenses:   Food Insecurity:   .  Worried About Charity fundraiser in the Last Year:   . Arboriculturist in the Last Year:   Transportation Needs:   . Film/video editor (Medical):   Marland Kitchen Lack of Transportation (Non-Medical):   Physical Activity:   . Days of Exercise per Week:   . Minutes of Exercise per Session:   Stress:   . Feeling of Stress :   Social Connections:   . Frequency of Communication with Friends and Family:   . Frequency of Social Gatherings with Friends and Family:   . Attends Religious Services:   . Active Member of Clubs or Organizations:   . Attends Archivist Meetings:   Marland Kitchen Marital Status:    Additional Social History:   Sleep: Improving  Appetite:  improving   Current Medications: Current Facility-Administered Medications  Medication Dose Route Frequency Provider Last Rate Last Admin  . acetaminophen (TYLENOL) tablet 650 mg  650 mg Oral Q6H PRN Sharma Covert, MD   650 mg at 04/03/19 1748  . alum & mag hydroxide-simeth (MAALOX/MYLANTA) 200-200-20 MG/5ML suspension 30 mL  30 mL Oral Q4H PRN Sharma Covert, MD      . buPROPion [facility] SR) 12 hr tablet 150 mg  150 mg Oral Daily Nathaly Dawkins, Myer Peer, MD   150 mg at 04/04/19 0912  . escitalopram (LEXAPRO) tablet 30 mg  30 mg Oral Daily Sharma Covert, MD   30 mg at 04/04/19 0177  . hydrOXYzine (ATARAX/VISTARIL) tablet 25 mg  25 mg Oral Q4H PRN Sharma Covert, MD   25 mg at 04/03/19 2130  . magnesium hydroxide (MILK OF MAGNESIA) suspension 30 mL  30 mL Oral Daily PRN Sharma Covert, MD      . nicotine (NICODERM CQ - dosed in mg/24 hours) patch 14 mg  14 mg Transdermal Daily Sharma Covert, MD   14 mg at 04/04/19 0914  . ondansetron (ZOFRAN-ODT) disintegrating tablet 4 mg  4 mg Oral Q8H PRN Sharma Covert, MD   4 mg at 04/02/19 1233  . traZODone (DESYREL) tablet 50 mg  50 mg Oral QHS PRN Myasia Sinatra, Myer Peer, MD   50 mg at 04/03/19 2130    Lab Results:  Results for orders placed or performed during the  hospital encounter of 03/29/19 (from the past 48 hour(s))  Hepatic function panel     Status: Abnormal   Collection Time: 04/04/19  6:43 AM  Result Value Ref Range   Total Protein 6.9 6.5 - 8.1 g/dL   Albumin 3.5 3.5 - 5.0 g/dL   AST 33 15 - 41 U/L   ALT 65 (H) 0 - 44 U/L   Alkaline Phosphatase 45 38 - 126 U/L   Total Bilirubin 0.5 0.3 - 1.2 mg/dL   Bilirubin, Direct 0.1 0.0 - 0.2  mg/dL   Indirect Bilirubin 0.4 0.3 - 0.9 mg/dL    Comment: Performed at [facility], [place] [address] [place], [place] [address]    Blood Alcohol level:  Lab Results  Component Value Date   [facility] <10 03/27/2019   ETH <10 81/85/6314    Metabolic Disorder Labs: Lab Results  Component Value Date   HGBA1C 5.4 03/30/2019   MPG 108.28 03/30/2019   No results found for: PROLACTIN Lab Results  Component Value Date   CHOL 184 03/30/2019   TRIG 80 03/30/2019   HDL 53 03/30/2019   CHOLHDL 3.5 03/30/2019   VLDL 16 03/30/2019   LDLCALC 115 (H) 03/30/2019    Physical Findings: AIMS: Facial and Oral Movements Muscles of Facial Expression: None, normal Lips and Perioral Area: None, normal Jaw: None, normal Tongue: None, normal,Extremity Movements Upper (arms, wrists, hands, fingers): None, normal Lower (legs, knees, ankles, toes): None, normal, Trunk Movements Neck, shoulders, hips: None, normal, Overall Severity Severity of abnormal movements (highest score from questions above): None, normal Incapacitation due to abnormal movements: None, normal Patient's awareness of abnormal movements (rate only patient's report): No Awareness, Dental Status Current problems with teeth and/or dentures?: No Does patient usually wear dentures?: No  CIWA:  CIWA-Ar Total: 4 COWS:  COWS Total Score: 1  Musculoskeletal: Strength & Muscle Tone: within normal limits Gait & Station: normal Patient leans: N/A  Psychiatric Specialty Exam: Physical Exam  Nursing note and vitals  reviewed. Constitutional: She is oriented to person, place, and time. She appears well-developed and well-nourished.  HENT:  Head: Normocephalic and atraumatic.  Respiratory: Effort normal.  Neurological: She is alert and oriented to person, place, and time.    Review of Systems no chest pain, no shortness of breath, no vomiting, no rash  Blood pressure 126/82, pulse 61, temperature 98.3 F (36.8 C), temperature source Oral, resp. rate 20, height '5\' 3"'  (1.6 m), weight 72.6 kg, last menstrual period 03/10/2019, SpO2 98 %.Body mass index is 28.34 kg/m.  General Appearance: improving grooming   Eye Contact:  Good  Speech:  Normal Rate  Volume:  Normal  Mood:  Reports feeling better, describes improving mood  Affect:  Full Range  Thought Process:  Linear and Descriptions of Associations: Intact  Orientation:  Other:  fully alert and attentive  Thought Content:  no hallucinations, no delusions, not internally preoccupied   Suicidal Thoughts:  No currently denies suicidal or self injurious ideations, denies homicidal or violent ideations, contracts for safety on unit   Homicidal Thoughts:  No  Memory:  recent and remote grossly intact   Judgement:  Other:  improving   Insight:  Fair/ improving   Psychomotor Activity:  Normal  Concentration:  Concentration: Good and Attention Span: Good  Recall:  Good  Fund of Knowledge:  Good  Language:  Good  Akathisia:  Negative  Handed:  Right  AIMS (if indicated):     Assets:  Desire for Improvement Resilience  ADL's:  Intact  Cognition:  WNL  Sleep:  Number of Hours: 6.5   Assessment:  Patient is a 39 year old female , presented to ED on 3/22 with sedation, following overdose ( Xanax ), which she reports she does not remember. States she suspects she was given some other substance/drugs by people she was with that day.  She also requested help with detoxification from heroin and methamphetamines.  Admission UDS positive for BZDs, Amphetamines,  Cannabis.  Patient describes gradual/ongoing improvement compared to admission.  She had  been concerned about her boyfriend's wellbeing after he had recently overdosed.  States she has found out that he is currently doing well and this has helped alleviate anxiety and depression as well.  Currently she presents future oriented and denies suicidal ideations.  She is tolerating Lexapro/Wellbutrin SR well.  Wellbutrin SR was decreased to 150 mg every morning yesterday as it was felt p.m. dose could be causing some side effects and contribute to insomnia.  She does reports she slept better last night.  Treatment Plan Summary: Treatment Plan reviewed as below today 3/31 Encourage efforts to work on sobriety and relapse prevention Encourage group and milieu participation Treatment team working on disposition planning options Continue Wellbutrin SR to 150 mg QAM for depression Continue Lexapro 30 mg p.o. daily for depression and anxiety. Continue Trazodone 50 mg p.o. nightly as needed for insomnia.   Jenne Campus, MD 04/04/2019, 12:44 PM    Patient ID: Ruben Gottron, female   DOB: 1980-10-31, 39 y.o.   MRN: [id number]

## 2019-04-04 NOTE — Progress Notes (Signed)
Pt denied SI/HI/AVH.  Pt having Increased anxiety this afternoon after finding out information about her boyfriend and his legal issues.  One time order received from Dr. Jama Flavors for ativan.  Pt is calm at this time.  RN will continue to monitor and provide support as needed.

## 2019-04-04 NOTE — [facility] Group Notes (Signed)
04/04/2019 8:45am Type of Group and Topic: Psychoeducational Group: Discharge Planning  Participation Level: Did Not Attend  Description of Group Discharge planning group reviews patient's anticipated discharge plans and assists patients to anticipate and address any barriers to wellness/recovery in the community. Suicide prevention education is reviewed with patients in group. Therapeutic Goals 1. Patients will state their anticipated discharge plan and mental health aftercare 2. Patients will identify potential barriers to wellness in the community setting 3. Patients will engage in problem solving, solution focused discussion of ways to anticipate and address barriers to wellness/recovery   Summary of Patient Progress Plan for Discharge/Comments:  Invited, chose not to attend.   Transportation Means:   Supports:   Therapeutic Modalities: Motivational Interviewing     Baldo Daub, MSW, LCSWA Clinical Social Worker [facility]  Phone: [phone] 04/04/2019 3:18 PM

## 2019-04-04 NOTE — Progress Notes (Signed)
Recreation Therapy Notes  Date:  3.31.21 Time: 0930 Location: [address] Group Room  Group Topic: Stress Management  Goal Area(s) Addresses:  Patient will identify positive stress management techniques. Patient will identify benefits of using stress management post d/c.  Behavioral Response: Engaged  Intervention: Stress Management  Activity : Guided Imagery.  LRT read a script that took patients on a journey to enjoy the peaceful waves by the ocean.  Patients were to listen and follow along as the script was read to engage in activity.   Education:  Stress Management, Discharge Planning.   Education Outcome: Acknowledges Education  Clinical Observations/Feedback: Pt attended and participated in group activity.    Caroll Rancher, LRT/CTRS         Caroll Rancher A 04/04/2019 11:19 AM

## 2019-04-04 NOTE — Tx Team (Signed)
Interdisciplinary Treatment and Diagnostic Plan Update  04/04/2019 Time of Session: 9:05a [name] MRN: [id number]  Principal Diagnosis: Bipolar 1 disorder (HCC)  Secondary Diagnoses: Principal Problem:   Bipolar 1 disorder (HCC) Active Problems:   Suicide attempt (HCC)   Amphetamine use disorder, severe, dependence (HCC)   Current Medications:  Current Facility-Administered Medications  Medication Dose Route Frequency Provider Last Rate Last Admin  . acetaminophen (TYLENOL) tablet 650 mg  650 mg Oral Q6H PRN Antonieta Pert, MD   650 mg at 04/03/19 1748  . alum & mag hydroxide-simeth (MAALOX/MYLANTA) 200-200-20 MG/5ML suspension 30 mL  30 mL Oral Q4H PRN Antonieta Pert, MD      . buPROPion [facility] SR) 12 hr tablet 150 mg  150 mg Oral Daily Cobos, Rockey Situ, MD   150 mg at 04/04/19 0912  . escitalopram (LEXAPRO) tablet 30 mg  30 mg Oral Daily Antonieta Pert, MD   30 mg at 04/04/19 2263  . hydrOXYzine (ATARAX/VISTARIL) tablet 25 mg  25 mg Oral Q4H PRN Antonieta Pert, MD   25 mg at 04/03/19 2130  . magnesium hydroxide (MILK OF MAGNESIA) suspension 30 mL  30 mL Oral Daily PRN Antonieta Pert, MD      . nicotine (NICODERM CQ - dosed in mg/24 hours) patch 14 mg  14 mg Transdermal Daily Antonieta Pert, MD   14 mg at 04/04/19 0914  . ondansetron (ZOFRAN-ODT) disintegrating tablet 4 mg  4 mg Oral Q8H PRN Antonieta Pert, MD   4 mg at 04/02/19 1233  . traZODone (DESYREL) tablet 50 mg  50 mg Oral QHS PRN Cobos, Rockey Situ, MD   50 mg at 04/03/19 2130   PTA Medications: Medications Prior to Admission  Medication Sig Dispense Refill Last Dose  . ALPRAZolam (XANAX) 1 MG tablet Take 1 tablet (1 mg total) by mouth 2 (two) times daily. 60 tablet 5   . buPROPion (WELLBUTRIN SR) 150 MG 12 hr tablet TAKE ONE TABLET BY MOUTH TWICE DAILY 60 tablet 5   . escitalopram (LEXAPRO) 20 MG tablet Take 1 tablet (20 mg total) by mouth every morning. 30 tablet 5     Patient  Stressors: Marital or family conflict Substance abuse Traumatic event  Patient Strengths: Ability for insight Communication skills Supportive family/friends  Treatment Modalities: Medication Management, Group therapy, Case management,  1 to 1 session with clinician, Psychoeducation, Recreational therapy.   Physician Treatment Plan for Primary Diagnosis: Bipolar 1 disorder (HCC) Long Term Goal(s): Improvement in symptoms so as ready for discharge Improvement in symptoms so as ready for discharge   Short Term Goals: Ability to identify changes in lifestyle to reduce recurrence of condition will improve Ability to verbalize feelings will improve Ability to disclose and discuss suicidal ideas Ability to demonstrate self-control will improve Ability to identify and develop effective coping behaviors will improve Compliance with prescribed medications will improve Ability to identify triggers associated with substance abuse/mental health issues will improve  Medication Management: Evaluate patient's response, side effects, and tolerance of medication regimen.  Therapeutic Interventions: 1 to 1 sessions, Unit Group sessions and Medication administration.  Evaluation of Outcomes: Progressing  Physician Treatment Plan for Secondary Diagnosis: Principal Problem:   Bipolar 1 disorder (HCC) Active Problems:   Suicide attempt (HCC)   Amphetamine use disorder, severe, dependence (HCC)  Long Term Goal(s): Improvement in symptoms so as ready for discharge Improvement in symptoms so as ready for discharge   Short Term Goals: Ability to  identify changes in lifestyle to reduce recurrence of condition will improve Ability to verbalize feelings will improve Ability to disclose and discuss suicidal ideas Ability to demonstrate self-control will improve Ability to identify and develop effective coping behaviors will improve Compliance with prescribed medications will improve Ability to identify  triggers associated with substance abuse/mental health issues will improve     Medication Management: Evaluate patient's response, side effects, and tolerance of medication regimen.  Therapeutic Interventions: 1 to 1 sessions, Unit Group sessions and Medication administration.  Evaluation of Outcomes: Progressing   RN Treatment Plan for Primary Diagnosis: Bipolar 1 disorder ([place]) Long Term Goal(s): Knowledge of disease and therapeutic regimen to maintain health will improve  Short Term Goals: Ability to participate in decision making will improve, Ability to verbalize feelings will improve, Ability to disclose and discuss suicidal ideas, Ability to identify and develop effective coping behaviors will improve and Compliance with prescribed medications will improve  Medication Management: RN will administer medications as ordered by provider, will assess and evaluate patient's response and provide education to patient for prescribed medication. RN will report any adverse and/or side effects to prescribing provider.  Therapeutic Interventions: 1 on 1 counseling sessions, Psychoeducation, Medication administration, Evaluate responses to treatment, Monitor vital signs and CBGs as ordered, Perform/monitor CIWA, COWS, AIMS and Fall Risk screenings as ordered, Perform wound care treatments as ordered.  Evaluation of Outcomes: Progressing   LCSW Treatment Plan for Primary Diagnosis: Bipolar 1 disorder ([place]) Long Term Goal(s): Safe transition to appropriate next level of care at discharge, Engage patient in therapeutic group addressing interpersonal concerns.  Short Term Goals: Engage patient in aftercare planning with referrals and resources  Therapeutic Interventions: Assess for all discharge needs, 1 to 1 time with Social worker, Explore available resources and support systems, Assess for adequacy in community support network, Educate family and significant other(s) on suicide prevention, Complete  Psychosocial Assessment, Interpersonal group therapy.  Evaluation of Outcomes: Progressing   Progress in Treatment: Attending groups: No.  Participating in groups: No. Taking medication as prescribed: Yes. Toleration medication: Yes. Family/Significant other contact made: No, will contact:  the patient's daughter Patient understands diagnosis: Yes. Discussing patient identified problems/goals with staff: Yes. Medical problems stabilized or resolved: Yes. Denies suicidal/homicidal ideation: Yes. Issues/concerns per patient self-inventory: No. Other:   New problem(s) identified: None   New Short Term/Long Term Goal(s):Detox, medication stabilization, elimination of SI thoughts, development of comprehensive mental wellness plan.    Patient Goals: "To get off drugs"    Discharge Plan or Barriers: Patient recently admitted. Patient expressed interest residential treatment. CSW will continue to follow and assess for appropriate referrals and possible discharge planning.    Reason for Continuation of Hospitalization: Anxiety Depression Medication stabilization Suicidal ideation  Estimated Length of Stay: 3-5 days   Attendees: Patient: [name]  04/04/2019 9:53 AM  Physician: Dr. Myles Lipps, MD; Dr. Neita Garnet, MD 04/04/2019 9:53 AM  Nursing:  04/04/2019 9:53 AM  RN Care Manager: 04/04/2019 9:53 AM  Social Worker: Radonna Ricker, LCSW 04/04/2019 9:53 AM  Recreational Therapist:  04/04/2019 9:53 AM  Other:  04/04/2019 9:53 AM  Other:  04/04/2019 9:53 AM  Other: 04/04/2019 9:53 AM    Scribe for Treatment Team: Marylee Floras, [place] 04/04/2019 9:53 AM

## 2019-04-04 NOTE — Progress Notes (Signed)
   04/04/19 0200  Psych Admission Type (Psych Patients Only)  Admission Status Voluntary  Psychosocial Assessment  Patient Complaints Worrying  Eye Contact Fair  Facial Expression Anxious  Affect Appropriate to circumstance  Speech Logical/coherent  Interaction Other (Comment);Attention-seeking (pleasant)  Motor Activity Other (Comment) (WNL)  Appearance/Hygiene Unremarkable  Behavior Characteristics Cooperative  Mood Anxious  Thought Process  Coherency WDL  Content WDL  Delusions WDL;None reported or observed  Perception WDL  Hallucination None reported or observed  Judgment Poor  Confusion None  Danger to Self  Current suicidal ideation? Denies  Danger to Others  Danger to Others None reported or observed

## 2019-04-04 NOTE — Progress Notes (Signed)
   04/04/19 2300  Psych Admission Type (Psych Patients Only)  Admission Status Voluntary  Psychosocial Assessment  Patient Complaints Anxiety  Eye Contact Fair  Facial Expression Anxious  Affect Appropriate to circumstance  Speech Logical/coherent  Interaction Other (Comment) (pleasant)  Motor Activity Other (Comment) (WNL)  Appearance/Hygiene Unremarkable  Behavior Characteristics Cooperative  Mood Anxious  Thought Process  Coherency WDL  Content WDL  Delusions WDL;None reported or observed  Perception WDL  Hallucination None reported or observed  Judgment WDL  Confusion None  Danger to Self  Current suicidal ideation? Denies  Danger to Others  Danger to Others None reported or observed

## 2019-04-05 DIAGNOSIS — F319 Bipolar disorder, unspecified: Secondary | ICD-10-CM | POA: Diagnosis not present

## 2019-04-05 MED ORDER — NICOTINE 14 MG/24HR TD PT24: 14.0000 mg | MEDICATED_PATCH | Freq: Every day | TRANSDERMAL | 0 refills | Status: DC

## 2019-04-05 MED ORDER — TRAZODONE HCL 50 MG PO TABS: 50.0000 mg | ORAL_TABLET | Freq: Every evening | ORAL | 0 refills | Status: DC | PRN

## 2019-04-05 MED ORDER — BUPROPION HCL ER (SR) 150 MG PO TB12: 150.0000 mg | ORAL_TABLET | Freq: Every day | ORAL | 0 refills | Status: DC

## 2019-04-05 MED ORDER — HYDROXYZINE HCL 25 MG PO TABS: 25.0000 mg | ORAL_TABLET | ORAL | 0 refills | Status: DC | PRN

## 2019-04-05 MED ORDER — ESCITALOPRAM OXALATE 10 MG PO TABS: 30.0000 mg | ORAL_TABLET | Freq: Every day | ORAL | 0 refills | Status: DC

## 2019-04-05 NOTE — Progress Notes (Signed)
Patient ID: [name], female   DOB: 12/01/1980, 39 y.o.   MRN: [id number] Patient educated on her discharge instructions, verbalized understanding, denies SI/HI/AVH, verbally contracted for safety outside of the hospital.  Pt left the hospital with all of her personal belongings and discharge instructions and scripts.

## 2019-04-05 NOTE — BHH Suicide Risk Assessment (Addendum)
[facility] Discharge Suicide Risk Assessment   Principal Problem: Bipolar 1 disorder [facility]) Discharge Diagnoses: Principal Problem:   Bipolar 1 disorder (HCC) Active Problems:   Suicide attempt (HCC)   Amphetamine use disorder, severe, dependence (HCC)   Total Time spent with patient: 30 minutes  Musculoskeletal: Strength & Muscle Tone: within normal limits Gait & Station: normal Patient leans: N/A  Psychiatric Specialty Exam: Review of Systems no headache, no chest pain, no shortness of breath, no vomiting, no rash  Blood pressure 102/75, pulse 75, temperature 98.4 F (36.9 C), temperature source Oral, resp. rate 20, height 5\' 3"  (1.6 m), weight 72.6 kg, last menstrual period 03/10/2019, SpO2 100 %.Body mass index is 28.34 kg/m.  General Appearance: Well Groomed  Eye Contact::  Good  Speech:  Normal Rate409  Volume:  Normal  Mood:  reports mood as improved, states " I feel pretty good today"  Affect:  Appropriate and reactive   Thought Process:  Linear and Descriptions of Associations: Intact  Orientation:  Other:  fully alert and attentive   Thought Content:  no hallucinations, no delusions , not internally preoccupied   Suicidal Thoughts:  No denies any suicidal or self injurious ideations, denies homicidal or violent ideations  Homicidal Thoughts:  No  Memory:  recent and remote grossly intact   Judgement:  Other:  improving   Insight:  imporving   Psychomotor Activity:  Normal  Concentration:  Good  Recall:  Good  Fund of Knowledge:Good  Language: Good  Akathisia:  Negative  Handed:  Left  AIMS (if indicated):     Assets:  Communication Skills Desire for Improvement Resilience  Sleep:  Number of Hours: 6.5  Cognition: WNL  ADL's:  Intact   Mental Status Per Nursing Assessment::   On Admission:  NA  Demographic Factors:  39 y old , has three children ( 18,14,11)   Loss Factors: Substance use disorder, boyfriend currently incarcerated   Historical  Factors: History of substance use disorder , no prior suicide attempts or psychiatric admissions   Risk Reduction Factors:   Responsible for children under 42 years of age, Living with another person, especially a relative and Positive coping skills or problem solving skills  Continued Clinical Symptoms:  At this time patient presents alert, attentive, calm , pleasant on approach, no thought disorder, denies SI or HI, no psychotic symptoms, future oriented . Behavior on unit calm and in good control.  Denies medication side effects * patient aware of mildly elevated transaminase no symptoms. Will follow with PCP  Cognitive Features That Contribute To Risk:  No gross cognitive deficits noted upon discharge. Is alert , attentive, and oriented x 3    Suicide Risk:  Mild:  Suicidal ideation of limited frequency, intensity, duration, and specificity.  There are no identifiable plans, no associated intent, mild dysphoria and related symptoms, good self-control (both objective and subjective assessment), few other risk factors, and identifiable protective factors, including available and accessible social support.  Follow-up Information    [facility], [facility]. Go on 04/09/2019.   Why: You are scheduled for an appointment on 04/09/19 at 11:00 am .  This appointment will be held in person.  Please have your insurance information and your discharge summary available and bring photo ID, SS card, proof of income if you have any. Contact information: [address] [place] [phone]           Plan Of Care/Follow-up recommendations:  Activity:  as tolerated  Diet:  regular Tests:  NA Other:  See below  Patient is expressing readiness for discharge, and is leaving unit in good spirits  Plans to go live with mother in law  Plans to follow up as above   Jenne Campus, MD 04/05/2019, 7:35 AM

## 2019-04-05 NOTE — Progress Notes (Signed)
  [facility] Adult Case Management Discharge Plan :  Will you be returning to the same living situation after discharge:  No. With fiance's mother  At discharge, do you have transportation home?: Yes,  Pt's step-mom Do you have the ability to pay for your medications: Yes,  medicaid  Release of information consent forms completed and in the chart;  Patient's signature needed at discharge.  Patient to Follow up at: Follow-up Information    [facility], [facility]. Go on 04/09/2019.   Why: You are scheduled for an appointment on 04/09/19 at 11:00 am .  This appointment will be held in person.  Please have your insurance information and your discharge summary available and bring photo ID, SS card, proof of income if you have any. Contact information: [address] [place] [address] [phone]           Next level of care provider has access to [facility] Link:no  Safety Planning and Suicide Prevention discussed: Yes,  2 attempts w daughter, Noralee Space, [phone], 1st attempt on 3/26, 2nd attempt on 3/27      Has patient been referred to the Quitline?: Patient refused referral  Patient has been referred for addiction treatment: Yes  Reynold Bowen, Student-Social Work 04/05/2019, 8:54 AM

## 2019-04-05 NOTE — Discharge Summary (Signed)
Physician Discharge Summary Note  Patient:  [name] is an 39 y.o., female  MRN:  [id number]  DOB:  1980/11/30  Patient phone:  [phone] (home)   Patient address:   [address] [place] [address],   Total Time spent with patient: Greater than 30 minutes  Date of Admission:  03/29/2019  Date of Discharge: 04-05-19  Reason for Admission: Possible drug (Benzodiazepine) overdose, seeking substance abuse treatment.  Principal Problem: Bipolar 1 disorder [facility])  Discharge Diagnoses: Principal Problem:   Bipolar 1 disorder ([place]) Active Problems:   Suicide attempt ([place])   Amphetamine use disorder, severe, dependence ([place])  Past Psychiatric History: Amphetamine use disorder.  Past Medical History:  Past Medical History:  Diagnosis Date  . Anxiety    takes Xanax daily as needed  . Chronic back pain    herniated disc  . Chronic pelvic pain in female   . Depression    takes Lexapro daily  . Drug-seeking behavior   . Headache    constant  . History of bronchitis    yrs ago  . History of kidney stones   . Insomnia    takes Elavil nightly  . Nocturia   . Seasonal allergies    Claritin and FLonase daily as needed    Past Surgical History:  Procedure Laterality Date  . CHOLECYSTECTOMY    . I & D EXTREMITY Left 08/03/2016   Procedure: IRRIGATION AND DEBRIDEMENT LEFT HAND;  Surgeon: Roseanne Kaufman, MD;  Location: [facility];  Service: Orthopedics;  Laterality: Left;  . THORACIC DISCECTOMY Right 03/28/2014   Procedure: RIGHT THORACIC ELEVEN-TWELVE MICRODICSECTOMY;  Surgeon: Newman Pies, MD;  Location: [place] [facility] OR[facility];  Service: Neurosurgery;  Laterality: Right;  right  . TUBAL LIGATION     Family History:  Family History  Problem Relation Age of Onset  . Hypertension Mother   . Hypertension Father   . Hypertension Sister   . Stroke Other   . Cancer Other   . Colon cancer Neg Hx    Family Psychiatric  History: See H&P  Social History:  Social History    Substance and Sexual Activity  Alcohol Use Yes   Comment: occ     Social History   Substance and Sexual Activity  Drug Use Yes  . Types: Marijuana, Methamphetamines    Social History   Socioeconomic History  . Marital status: Legally Separated    Spouse name: Not on file  . Number of children: 3  . Years of education: Not on file  . Highest education level: Not on file  Occupational History  . Not on file  Tobacco Use  . Smoking status: Current Every Day Smoker    Packs/day: 0.50    Years: 0.00    Pack years: 0.00    Types: Cigarettes  . Smokeless tobacco: Never Used  Substance and Sexual Activity  . Alcohol use: Yes    Comment: occ  . Drug use: Yes    Types: Marijuana, Methamphetamines  . Sexual activity: Not on file  Other Topics Concern  . Not on file  Social History Narrative  . Not on file   Social Determinants of Health   Financial Resource Strain:   . Difficulty of Paying Living Expenses:   Food Insecurity:   . Worried About Charity fundraiser in the Last Year:   . Arboriculturist in the Last Year:   Transportation Needs:   . Film/video editor (Medical):   Marland Kitchen  Lack of Transportation (Non-Medical):   Physical Activity:   . Days of Exercise per Week:   . Minutes of Exercise per Session:   Stress:   . Feeling of Stress :   Social Connections:   . Frequency of Communication with Friends and Family:   . Frequency of Social Gatherings with Friends and Family:   . Attends Religious Services:   . Active Member of Clubs or Organizations:   . Attends Banker Meetings:   Marland Kitchen Marital Status:    Hospital Course: (Per Md's admission evaluation): Patient is a 39 year old female with a past psychiatric history significant for opiate dependence, methamphetamine dependence as well as benzodiazepine dependence who presented to the [facility] on 03/27/2019 with suicidal ideation. She was requesting detox from methamphetamines as  well as heroin. She stopped using drugs the day prior to admission. She also stated that she was unable to keep food down secondary to nausea. She stated that she was currently homeless, and wanted to get involved with residential substance abuse treatment. She had also presented on 03/26/2019 to the [facility] emergency department. She was brought in by EMS at that time for possible overdose. She had taken 3 mg of Xanax the day of that evaluation. She apparently became progressively slurred and somnolent. EMS saw her and their main reason for being there was treating her boyfriend who also had apparently overdosed. She was admitted to the hospital for evaluation and stabilization.  This is the first psychiatric admission/discharge summary from this [facility] for this 39 year old Caucasian female. However, she was known in the other [facility] systems for problems related to drug abuse/addiction. She was brought to the hospital ED for crisis management for what appeared to be drug overdose. This was evidenced by the presentation of slurred.incoherent speech & was in a somnolent state. Apparently per admission reports, patient reported taken 3 mg of Xanax tablet.She was brought to the hospital for evaluation & mood stabilization treatments.    After evaluation of her presenting symptoms, [name] was recommended for mood stabilization & detoxification treatments. The medication regimen for her presenting symptoms were discussed & with her consent initiated. She received, stabilized & was discharged on the medications as listed below on her discharge medication lists. She was also enrolled & participated in the group counseling sessions being offered & held on this unit. She learned coping skills. She presented on this admission, no other pre-existing medical condition that required treatment. She tolerated his treatment regimen without any adverse effects or reactions reported.   During the course of her  hospitalization, the 15-minute checks were adequate to ensure [name]'s safety. Patient did not display any dangerous, violent or suicidal behavior on the unit. She interacted with patients & staff appropriately, participated appropriately in the group sessions/therapies. Her medications were addressed & adjusted to meet her needs. She was recommended for outpatient follow-up care & medication management upon discharge to assure her continuity of care.  At the time of discharge patient is not reporting any acute suicidal/homicidal ideations. She feels more confident about her self & mental health care. She currently denies any new issues or concerns. Education & supportive counseling provided throughout her hospital stay & upon discharge.   Today upon her discharge evaluation with the attending psychiatrist, Adeja shares she is doing well. She denies any other specific concerns. He is sleeping well. His appetite is good. She denies other physical complaints. She denies AH/VH. She feels that her medications have  been helpful & is in agreement to continue her current treatment regimen as recommended. She was able to engage in safety planning including plan to return to [facility] or contact emergency services if she feels unable to maintain her own safety or the safety of others. Pt had no further questions, comments, or concerns. She left [facility] with all personal belongings in no apparent distress. Transportation per his family (step-mom).  Physical Findings: AIMS: Facial and Oral Movements Muscles of Facial Expression: None, normal Lips and Perioral Area: None, normal Jaw: None, normal Tongue: None, normal,Extremity Movements Upper (arms, wrists, hands, fingers): None, normal Lower (legs, knees, ankles, toes): None, normal, Trunk Movements Neck, shoulders, hips: None, normal, Overall Severity Severity of abnormal movements (highest score from questions above): None, normal Incapacitation due to abnormal  movements: None, normal Patient's awareness of abnormal movements (rate only patient's report): No Awareness, Dental Status Current problems with teeth and/or dentures?: No Does patient usually wear dentures?: No  CIWA:  CIWA-Ar Total: 4 COWS:  COWS Total Score: 0  Musculoskeletal: Strength & Muscle Tone: within normal limits Gait & Station: normal Patient leans: N/A  Psychiatric Specialty Exam: Physical Exam  Nursing note and vitals reviewed. Constitutional: She is oriented to person, place, and time. She appears well-developed.  Eyes: Pupils are equal, round, and reactive to light.  Cardiovascular: Normal rate.  Respiratory: Effort normal.  Genitourinary:    Genitourinary Comments: Deferred   Musculoskeletal:        General: Normal range of motion.     Cervical back: Normal range of motion.  Neurological: She is alert and oriented to person, place, and time.  Skin: Skin is warm and dry.    Review of Systems  Constitutional: Negative for chills, diaphoresis and fever.  HENT: Negative for congestion, rhinorrhea, sneezing and sore throat.   Eyes: Negative for discharge.  Respiratory: Negative for cough, shortness of breath and wheezing.   Cardiovascular: Negative for chest pain and palpitations.  Gastrointestinal: Negative for diarrhea, nausea and vomiting.  Endocrine: Negative for cold intolerance.  Genitourinary: Negative for difficulty urinating.  Musculoskeletal: Negative for arthralgias.  Skin: Negative.   Allergic/Immunologic: Negative for environmental allergies and food allergies.       Allergies: Motrin, Hydrocodone, PCN, Ultram & ASA  Neurological: Negative for dizziness, tremors, seizures, syncope, light-headedness, numbness and headaches.  Psychiatric/Behavioral: Positive for dysphoric mood (Stabilized with medication prior to discharge) and sleep disturbance (Stabililized with medication prior to discharge). Negative for agitation, behavioral problems, confusion,  decreased concentration, hallucinations, self-injury and suicidal ideas. The patient is not nervous/anxious (Stable) and is not hyperactive.     Blood pressure 102/75, pulse 75, temperature 98.4 F (36.9 C), temperature source Oral, resp. rate 20, height 5\' 3"  (1.6 m), weight 72.6 kg, last menstrual period 03/10/2019, SpO2 100 %.Body mass index is 28.34 kg/m.  See Md's discharge SRA  Sleep:  Number of Hours: 6.5    Has this patient used any form of tobacco in the last 30 days? (Cigarettes, Smokeless Tobacco, Cigars, and/or Pipes): Yes, A prescription for an FDA-approved tobacco cessation medication was recommended at discharge.  Blood Alcohol level:  Lab Results  Component Value Date   [facility] <10 03/27/2019   ETH <10 03/26/2019    Metabolic Disorder Labs:  Lab Results  Component Value Date   HGBA1C 5.4 03/30/2019   MPG 108.28 03/30/2019   No results found for: PROLACTIN Lab Results  Component Value Date   CHOL 184 03/30/2019   TRIG 80 03/30/2019  HDL 53 03/30/2019   CHOLHDL 3.5 03/30/2019   VLDL 16 03/30/2019   LDLCALC 115 (H) 03/30/2019   See Psychiatric Specialty Exam and Suicide Risk Assessment completed by Attending Physician prior to discharge.  Discharge destination:  Home  Is patient on multiple antipsychotic therapies at discharge:  No   Has Patient had three or more failed trials of antipsychotic monotherapy by history:  No  Recommended Plan for Multiple Antipsychotic Therapies: NA  Allergies as of 04/05/2019      Reactions   Motrin [ibuprofen]    Hives emesis   Hydrocodone Hives   Penicillins Nausea And Vomiting   Ultram [tramadol] Nausea And Vomiting   Aspirin Nausea And Vomiting, Rash      Medication List    STOP taking these medications   ALPRAZolam 1 MG tablet Commonly known as: XANAX     TAKE these medications     Indication  buPROPion 150 MG 12 hr tablet Commonly known as: WELLBUTRIN SR Take 1 tablet (150 mg total) by mouth daily. For  depression Start taking on: April 06, 2019 What changed:   how much to take  how to take this  when to take this  additional instructions  Indication: Major Depressive Disorder   escitalopram 10 MG tablet Commonly known as: LEXAPRO Take 3 tablets (30 mg total) by mouth daily. For depression Start taking on: April 06, 2019 What changed:   medication strength  how much to take  when to take this  additional instructions  Indication: Major Depressive Disorder   hydrOXYzine 25 MG tablet Commonly known as: ATARAX/VISTARIL Take 1 tablet (25 mg total) by mouth every 4 (four) hours as needed for itching or anxiety.  Indication: Feeling Anxious   nicotine 14 mg/24hr patch Commonly known as: NICODERM CQ - dosed in mg/24 hours Place 1 patch (14 mg total) onto the skin daily. (May buy from over the counter): For smoking cessation Start taking on: April 06, 2019  Indication: Nicotine Addiction   traZODone 50 MG tablet Commonly known as: DESYREL Take 1 tablet (50 mg total) by mouth at bedtime as needed for sleep.  Indication: Trouble Sleeping      Follow-up Information    [facility]. Go on 04/09/2019.   Why: You are scheduled for an appointment on 04/09/19 at 11:00 am .  This appointment will be held in person.  Please have your insurance information and your discharge summary available and bring photo ID, SS card, proof of income if you have any. Contact information: [address] [place] [address] [phone]          Follow-up recommendations: Activity:  As tolerated Diet: As recommended by your primary care doctor. Keep all scheduled follow-up appointments as recommended.  Comments: Prescriptions given at discharge.  Patient agreeable to plan.  Given opportunity to ask questions.  Appears to feel comfortable with discharge denies any current suicidal or homicidal thought. Patient is also instructed prior to discharge to: Take all medications as  prescribed by his/her mental healthcare provider. Report any adverse effects and or reactions from the medicines to his/her outpatient provider promptly. Patient has been instructed & cautioned: To not engage in alcohol and or illegal drug use while on prescription medicines. In the event of worsening symptoms, patient is instructed to call the crisis hotline, 911 and or go to the nearest ED for appropriate evaluation and treatment of symptoms. To follow-up with his/her primary care provider for your other medical issues, concerns and  or health care needs.  Signed: Armandina Stammer, NP, PMHNP, FNP-BC 04/05/2019, 9:18 AM

## 2019-04-23 ENCOUNTER — Telehealth: Payer: Self-pay | Admitting: Family Medicine

## 2019-04-23 ENCOUNTER — Other Ambulatory Visit: Payer: Self-pay | Admitting: Family Medicine
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
## 2019-04-23 NOTE — Telephone Encounter (Signed)
Absolutely no controlled substances going forward due to circumstances of her hospitalization, xanax was cancelled intentionally, needs to go thru [facility] for all [facility] meds in the future

## 2019-04-23 NOTE — Telephone Encounter (Signed)
Attempted to contact patient. Phone has busy signal, will try again later

## 2019-04-23 NOTE — Telephone Encounter (Signed)
Back in March all Xanax script were cancelled. Please advise. Thank you

## 2019-04-25 NOTE — Telephone Encounter (Signed)
Number left in message is wrong number. Number in chart continues to be busy.

## 2019-04-26 NOTE — Telephone Encounter (Signed)
Unable to get in touch with patient. Phone busy.

## 2019-04-26 NOTE — Telephone Encounter (Signed)
Keep trying

## 2019-04-27 NOTE — Telephone Encounter (Signed)
Letter being mailed to patient to return call

## 2019-04-27 NOTE — Telephone Encounter (Signed)
Phone number in chart is not correct

## 2019-05-04 NOTE — Telephone Encounter (Signed)
Pt still has not returned call.

## 2019-05-06 NOTE — Telephone Encounter (Signed)
We tried, file message. No further controlled substances!, and, mental health meds per mental health provider

## 2019-06-26 DIAGNOSIS — Z029 Encounter for administrative examinations, unspecified: Secondary | ICD-10-CM

## 2019-07-10 DIAGNOSIS — R7989 Other specified abnormal findings of blood chemistry: Secondary | ICD-10-CM | POA: Diagnosis not present

## 2019-07-10 DIAGNOSIS — I700 Atherosclerosis of aorta: Secondary | ICD-10-CM | POA: Diagnosis not present

## 2019-07-10 DIAGNOSIS — F1990 Other psychoactive substance use, unspecified, uncomplicated: Secondary | ICD-10-CM | POA: Diagnosis not present

## 2019-07-10 DIAGNOSIS — Z87898 Personal history of other specified conditions: Secondary | ICD-10-CM | POA: Diagnosis not present

## 2019-07-10 DIAGNOSIS — N83202 Unspecified ovarian cyst, left side: Secondary | ICD-10-CM | POA: Diagnosis not present

## 2019-07-10 DIAGNOSIS — K3580 Unspecified acute appendicitis: Secondary | ICD-10-CM | POA: Diagnosis not present

## 2019-07-10 DIAGNOSIS — A599 Trichomoniasis, unspecified: Secondary | ICD-10-CM | POA: Diagnosis not present

## 2019-07-10 DIAGNOSIS — A749 Chlamydial infection, unspecified: Secondary | ICD-10-CM | POA: Diagnosis not present

## 2019-07-10 DIAGNOSIS — A549 Gonococcal infection, unspecified: Secondary | ICD-10-CM | POA: Diagnosis not present

## 2019-07-10 DIAGNOSIS — R9389 Abnormal findings on diagnostic imaging of other specified body structures: Secondary | ICD-10-CM | POA: Diagnosis not present

## 2019-07-10 DIAGNOSIS — K388 Other specified diseases of appendix: Secondary | ICD-10-CM | POA: Diagnosis not present

## 2019-07-10 DIAGNOSIS — N730 Acute parametritis and pelvic cellulitis: Secondary | ICD-10-CM | POA: Diagnosis not present

## 2019-07-10 DIAGNOSIS — R102 Pelvic and perineal pain: Secondary | ICD-10-CM | POA: Diagnosis not present

## 2019-07-10 DIAGNOSIS — N83201 Unspecified ovarian cyst, right side: Secondary | ICD-10-CM | POA: Diagnosis not present

## 2019-07-10 DIAGNOSIS — N739 Female pelvic inflammatory disease, unspecified: Secondary | ICD-10-CM | POA: Diagnosis not present

## 2019-07-10 DIAGNOSIS — N898 Other specified noninflammatory disorders of vagina: Secondary | ICD-10-CM | POA: Diagnosis not present

## 2019-07-10 DIAGNOSIS — Z3202 Encounter for pregnancy test, result negative: Secondary | ICD-10-CM | POA: Diagnosis not present

## 2019-07-11 DIAGNOSIS — N739 Female pelvic inflammatory disease, unspecified: Secondary | ICD-10-CM | POA: Diagnosis not present

## 2019-07-11 DIAGNOSIS — F1910 Other psychoactive substance abuse, uncomplicated: Secondary | ICD-10-CM | POA: Diagnosis not present

## 2019-07-11 DIAGNOSIS — K389 Disease of appendix, unspecified: Secondary | ICD-10-CM | POA: Diagnosis not present

## 2019-07-11 DIAGNOSIS — Z87898 Personal history of other specified conditions: Secondary | ICD-10-CM | POA: Diagnosis not present

## 2019-07-12 DIAGNOSIS — A749 Chlamydial infection, unspecified: Secondary | ICD-10-CM | POA: Diagnosis not present

## 2019-07-12 DIAGNOSIS — A549 Gonococcal infection, unspecified: Secondary | ICD-10-CM | POA: Diagnosis not present

## 2019-07-12 DIAGNOSIS — R7989 Other specified abnormal findings of blood chemistry: Secondary | ICD-10-CM | POA: Diagnosis not present

## 2019-07-12 DIAGNOSIS — R102 Pelvic and perineal pain: Secondary | ICD-10-CM | POA: Diagnosis not present

## 2019-07-12 DIAGNOSIS — Z3202 Encounter for pregnancy test, result negative: Secondary | ICD-10-CM | POA: Diagnosis not present

## 2019-07-12 DIAGNOSIS — A599 Trichomoniasis, unspecified: Secondary | ICD-10-CM | POA: Diagnosis not present

## 2019-07-12 DIAGNOSIS — N730 Acute parametritis and pelvic cellulitis: Secondary | ICD-10-CM | POA: Diagnosis not present

## 2019-07-12 DIAGNOSIS — N898 Other specified noninflammatory disorders of vagina: Secondary | ICD-10-CM | POA: Diagnosis not present

## 2019-07-13 DIAGNOSIS — K389 Disease of appendix, unspecified: Secondary | ICD-10-CM | POA: Diagnosis not present

## 2019-07-13 DIAGNOSIS — N739 Female pelvic inflammatory disease, unspecified: Secondary | ICD-10-CM | POA: Diagnosis not present

## 2019-07-13 DIAGNOSIS — Z87898 Personal history of other specified conditions: Secondary | ICD-10-CM | POA: Diagnosis not present

## 2019-07-13 DIAGNOSIS — F1910 Other psychoactive substance abuse, uncomplicated: Secondary | ICD-10-CM | POA: Diagnosis not present

## 2019-09-18 DIAGNOSIS — L03116 Cellulitis of left lower limb: Secondary | ICD-10-CM | POA: Diagnosis not present

## 2019-09-18 DIAGNOSIS — F17200 Nicotine dependence, unspecified, uncomplicated: Secondary | ICD-10-CM | POA: Diagnosis not present

## 2019-11-20 DIAGNOSIS — R059 Cough, unspecified: Secondary | ICD-10-CM | POA: Diagnosis not present

## 2020-04-04 ENCOUNTER — Inpatient Hospital Stay (HOSPITAL_COMMUNITY)
Admission: EM | Admit: 2020-04-04 | Discharge: 2020-04-08 | DRG: 917 | Disposition: A | Discharge status: Home / Self Care | Payer: Medicaid Other | Attending: Family Medicine | Admitting: Family Medicine

## 2020-04-04 ENCOUNTER — Other Ambulatory Visit: Payer: Self-pay

## 2020-04-04 ENCOUNTER — Encounter (HOSPITAL_COMMUNITY): Payer: Self-pay | Admitting: Emergency Medicine

## 2020-04-04 ENCOUNTER — Emergency Department (HOSPITAL_COMMUNITY): Payer: Medicaid Other

## 2020-04-04 DIAGNOSIS — I959 Hypotension, unspecified: Secondary | ICD-10-CM | POA: Diagnosis present

## 2020-04-04 DIAGNOSIS — G4700 Insomnia, unspecified: Secondary | ICD-10-CM | POA: Diagnosis present

## 2020-04-04 DIAGNOSIS — F32A Depression, unspecified: Secondary | ICD-10-CM | POA: Diagnosis not present

## 2020-04-04 DIAGNOSIS — J9601 Acute respiratory failure with hypoxia: Secondary | ICD-10-CM | POA: Diagnosis present

## 2020-04-04 DIAGNOSIS — Z79899 Other long term (current) drug therapy: Secondary | ICD-10-CM

## 2020-04-04 DIAGNOSIS — Y92002 Bathroom of unspecified non-institutional (private) residence single-family (private) house as the place of occurrence of the external cause: Secondary | ICD-10-CM

## 2020-04-04 DIAGNOSIS — T50904A Poisoning by unspecified drugs, medicaments and biological substances, undetermined, initial encounter: Secondary | ICD-10-CM

## 2020-04-04 DIAGNOSIS — J690 Pneumonitis due to inhalation of food and vomit: Secondary | ICD-10-CM

## 2020-04-04 DIAGNOSIS — T401X1A Poisoning by heroin, accidental (unintentional), initial encounter: Secondary | ICD-10-CM | POA: Diagnosis not present

## 2020-04-04 DIAGNOSIS — F319 Bipolar disorder, unspecified: Secondary | ICD-10-CM | POA: Diagnosis present

## 2020-04-04 DIAGNOSIS — R0902 Hypoxemia: Secondary | ICD-10-CM | POA: Diagnosis not present

## 2020-04-04 DIAGNOSIS — F419 Anxiety disorder, unspecified: Secondary | ICD-10-CM | POA: Diagnosis not present

## 2020-04-04 DIAGNOSIS — Z880 Allergy status to penicillin: Secondary | ICD-10-CM

## 2020-04-04 DIAGNOSIS — J302 Other seasonal allergic rhinitis: Secondary | ICD-10-CM | POA: Diagnosis present

## 2020-04-04 DIAGNOSIS — R404 Transient alteration of awareness: Secondary | ICD-10-CM | POA: Diagnosis not present

## 2020-04-04 DIAGNOSIS — F1910 Other psychoactive substance abuse, uncomplicated: Secondary | ICD-10-CM | POA: Diagnosis present

## 2020-04-04 DIAGNOSIS — F17210 Nicotine dependence, cigarettes, uncomplicated: Secondary | ICD-10-CM | POA: Diagnosis present

## 2020-04-04 DIAGNOSIS — T50901A Poisoning by unspecified drugs, medicaments and biological substances, accidental (unintentional), initial encounter: Secondary | ICD-10-CM | POA: Diagnosis present

## 2020-04-04 DIAGNOSIS — R4182 Altered mental status, unspecified: Secondary | ICD-10-CM | POA: Diagnosis present

## 2020-04-04 DIAGNOSIS — R4020 Unspecified coma: Secondary | ICD-10-CM | POA: Diagnosis not present

## 2020-04-04 DIAGNOSIS — K219 Gastro-esophageal reflux disease without esophagitis: Secondary | ICD-10-CM | POA: Diagnosis present

## 2020-04-04 DIAGNOSIS — Z823 Family history of stroke: Secondary | ICD-10-CM

## 2020-04-04 DIAGNOSIS — J698 Pneumonitis due to inhalation of other solids and liquids: Secondary | ICD-10-CM | POA: Diagnosis not present

## 2020-04-04 DIAGNOSIS — Z8249 Family history of ischemic heart disease and other diseases of the circulatory system: Secondary | ICD-10-CM

## 2020-04-04 DIAGNOSIS — Z9151 Personal history of suicidal behavior: Secondary | ICD-10-CM

## 2020-04-04 DIAGNOSIS — F3160 Bipolar disorder, current episode mixed, unspecified: Secondary | ICD-10-CM | POA: Diagnosis not present

## 2020-04-04 DIAGNOSIS — L02211 Cutaneous abscess of abdominal wall: Secondary | ICD-10-CM | POA: Diagnosis present

## 2020-04-04 DIAGNOSIS — Z885 Allergy status to narcotic agent status: Secondary | ICD-10-CM

## 2020-04-04 DIAGNOSIS — L98499 Non-pressure chronic ulcer of skin of other sites with unspecified severity: Secondary | ICD-10-CM | POA: Diagnosis present

## 2020-04-04 DIAGNOSIS — G928 Other toxic encephalopathy: Secondary | ICD-10-CM | POA: Diagnosis present

## 2020-04-04 DIAGNOSIS — Z20822 Contact with and (suspected) exposure to covid-19: Secondary | ICD-10-CM | POA: Diagnosis present

## 2020-04-04 DIAGNOSIS — R000 Tachycardia, unspecified: Secondary | ICD-10-CM | POA: Diagnosis not present

## 2020-04-04 DIAGNOSIS — Z888 Allergy status to other drugs, medicaments and biological substances status: Secondary | ICD-10-CM

## 2020-04-04 DIAGNOSIS — L0211 Cutaneous abscess of neck: Secondary | ICD-10-CM | POA: Diagnosis present

## 2020-04-04 DIAGNOSIS — T887XXA Unspecified adverse effect of drug or medicament, initial encounter: Secondary | ICD-10-CM | POA: Diagnosis not present

## 2020-04-04 HISTORY — DX: Other psychoactive substance abuse, uncomplicated: F19.10

## 2020-04-04 LAB — COMPREHENSIVE METABOLIC PANEL
ALT: 68 U/L — ABNORMAL HIGH (ref 0–44)
ALT: 66 U/L — ABNORMAL HIGH (ref 0–44)
AST: 36 U/L (ref 15–41)
AST: 34 U/L (ref 15–41)
Albumin: 2.9 g/dL — ABNORMAL LOW (ref 3.5–5.0)
Albumin: 2.9 g/dL — ABNORMAL LOW (ref 3.5–5.0)
Alkaline Phosphatase: 58 U/L (ref 38–126)
Alkaline Phosphatase: 56 U/L (ref 38–126)
Anion gap: 6 (ref 5–15)
Anion gap: 7 (ref 5–15)
BUN: 15 mg/dL (ref 6–20)
BUN: 14 mg/dL (ref 6–20)
CO2: 24 mmol/L (ref 22–32)
CO2: 25 mmol/L (ref 22–32)
Calcium: 8 mg/dL — ABNORMAL LOW (ref 8.9–10.3)
Calcium: 8.3 mg/dL — ABNORMAL LOW (ref 8.9–10.3)
Chloride: 109 mmol/L (ref 98–111)
Chloride: 107 mmol/L (ref 98–111)
Creatinine, Ser: 0.84 mg/dL (ref 0.44–1.00)
Creatinine, Ser: 0.91 mg/dL (ref 0.44–1.00)
GFR, Estimated: >60 mL/min (ref >60)
GFR, Estimated: >60 mL/min (ref >60)
Glucose, Bld: 121 mg/dL — ABNORMAL HIGH (ref 70–99)
Glucose, Bld: 83 mg/dL (ref 70–99)
Potassium: 3.7 mmol/L (ref 3.5–5.1)
Potassium: 4.4 mmol/L (ref 3.5–5.1)
Sodium: 139 mmol/L (ref 135–145)
Sodium: 139 mmol/L (ref 135–145)
Total Bilirubin: 0.1 mg/dL — ABNORMAL LOW (ref 0.3–1.2)
Total Bilirubin: 0.6 mg/dL (ref 0.3–1.2)
Total Protein: 6.6 g/dL (ref 6.5–8.1)
Total Protein: 6.4 g/dL — ABNORMAL LOW (ref 6.5–8.1)

## 2020-04-04 LAB — RAPID URINE DRUG SCREEN, HOSP PERFORMED
Amphetamines: POSITIVE — AB
Barbiturates: NOT DETECTED
Benzodiazepines: POSITIVE — AB
Cocaine: POSITIVE — AB
Opiates: NOT DETECTED
Tetrahydrocannabinol: POSITIVE — AB

## 2020-04-04 LAB — CBC WITH DIFFERENTIAL/PLATELET
Abs Immature Granulocytes: 0.03 10*3/uL (ref 0.00–0.07)
Basophils Absolute: 0.1 10*3/uL (ref 0.0–0.1)
Basophils Relative: 1 %
Eosinophils Absolute: 0.1 10*3/uL (ref 0.0–0.5)
Eosinophils Relative: 1 %
HCT: 40.5 % (ref 36.0–46.0)
Hemoglobin: 12.2 g/dL (ref 12.0–15.0)
Immature Granulocytes: 0 %
Lymphocytes Relative: 12 %
Lymphs Abs: 1.4 10*3/uL (ref 0.7–4.0)
MCH: 27.2 pg (ref 26.0–34.0)
MCHC: 30.1 g/dL (ref 30.0–36.0)
MCV: 90.4 fL (ref 80.0–100.0)
Monocytes Absolute: 0.3 10*3/uL (ref 0.1–1.0)
Monocytes Relative: 3 %
Neutro Abs: 9.3 10*3/uL — ABNORMAL HIGH (ref 1.7–7.7)
Neutrophils Relative %: 83 %
Platelets: 451 10*3/uL — ABNORMAL HIGH (ref 150–400)
RBC: 4.48 MIL/uL (ref 3.87–5.11)
RDW: 15.9 % — ABNORMAL HIGH (ref 11.5–15.5)
WBC: 11.2 10*3/uL — ABNORMAL HIGH (ref 4.0–10.5)
nRBC: 0 % (ref 0.0–0.2)

## 2020-04-04 LAB — URINALYSIS, ROUTINE W REFLEX MICROSCOPIC
Bacteria, UA: NONE SEEN
Bilirubin Urine: NEGATIVE
Glucose, UA: NEGATIVE mg/dL
Ketones, ur: NEGATIVE mg/dL
Leukocytes,Ua: NEGATIVE
Nitrite: NEGATIVE
Protein, ur: NEGATIVE mg/dL
Specific Gravity, Urine: 1.024 (ref 1.005–1.030)
pH: 5 (ref 5.0–8.0)

## 2020-04-04 LAB — CBC
HCT: 49.8 % — ABNORMAL HIGH (ref 36.0–46.0)
Hemoglobin: 14.5 g/dL (ref 12.0–15.0)
MCH: 26.9 pg (ref 26.0–34.0)
MCHC: 29.1 g/dL — ABNORMAL LOW (ref 30.0–36.0)
MCV: 92.2 fL (ref 80.0–100.0)
Platelets: 391 10*3/uL (ref 150–400)
RBC: 5.4 MIL/uL — ABNORMAL HIGH (ref 3.87–5.11)
RDW: 16.1 % — ABNORMAL HIGH (ref 11.5–15.5)
WBC: 7.2 10*3/uL (ref 4.0–10.5)
nRBC: 0 % (ref 0.0–0.2)

## 2020-04-04 LAB — HCG, SERUM, QUALITATIVE: Preg, Serum: NEGATIVE

## 2020-04-04 LAB — LACTIC ACID, PLASMA
Lactic Acid, Venous: 1.5 mmol/L (ref 0.5–1.9)
Lactic Acid, Venous: 1.8 mmol/L (ref 0.5–1.9)

## 2020-04-04 LAB — RESP PANEL BY RT-PCR (FLU A&B, COVID) ARPGX2
Influenza A by PCR: NEGATIVE
Influenza B by PCR: NEGATIVE
SARS Coronavirus 2 by RT PCR: NEGATIVE

## 2020-04-04 LAB — MAGNESIUM: Magnesium: 1.7 mg/dL (ref 1.7–2.4)

## 2020-04-04 LAB — PROCALCITONIN: Procalcitonin: 0.78 ng/mL

## 2020-04-04 LAB — MRSA PCR SCREENING: MRSA by PCR: POSITIVE — AB

## 2020-04-04 LAB — PHOSPHORUS: Phosphorus: 4.3 mg/dL (ref 2.5–4.6)

## 2020-04-04 LAB — HIV ANTIBODY (ROUTINE TESTING W REFLEX): HIV Screen 4th Generation wRfx: NONREACTIVE

## 2020-04-04 LAB — BRAIN NATRIURETIC PEPTIDE: B Natriuretic Peptide: 24 pg/mL (ref 0.0–100.0)

## 2020-04-04 LAB — ETHANOL: Alcohol, Ethyl (B): <10 mg/dL (ref <10)

## 2020-04-04 MED ORDER — LEVOFLOXACIN IN D5W 750 MG/150ML IV SOLN
750.0000 mg | Freq: Once | INTRAVENOUS | Status: DC
  Administered 2020-04-04: 750 mg via INTRAVENOUS
  Filled 2020-04-04: qty 150

## 2020-04-04 MED ORDER — ESCITALOPRAM OXALATE 10 MG PO TABS
30.0000 mg | ORAL_TABLET | Freq: Every day | ORAL | Status: DC
  Administered 2020-04-04 – 2020-04-05 (×2): 30 mg via ORAL
  Filled 2020-04-04 (×2): qty 3

## 2020-04-04 MED ORDER — BUPROPION HCL ER (SR) 150 MG PO TB12
150.0000 mg | ORAL_TABLET | Freq: Every day | ORAL | Status: DC
  Administered 2020-04-04 – 2020-04-06 (×3): 150 mg via ORAL
  Filled 2020-04-04 (×3): qty 1

## 2020-04-04 MED ORDER — ACETAMINOPHEN 650 MG RE SUPP: 650.0000 mg | Freq: Four times a day (QID) | RECTAL | Status: DC | PRN

## 2020-04-04 MED ORDER — HEPARIN SODIUM (PORCINE) 5000 UNIT/ML IJ SOLN
5000.0000 [IU] | Freq: Three times a day (TID) | INTRAMUSCULAR | Status: DC
  Administered 2020-04-04 – 2020-04-08 (×13): 5000 [IU] via SUBCUTANEOUS
  Filled 2020-04-04 (×14): qty 1

## 2020-04-04 MED ORDER — ENSURE ENLIVE PO LIQD
237.0000 mL | Freq: Two times a day (BID) | ORAL | Status: DC
  Administered 2020-04-05 – 2020-04-06 (×2): 237 mL via ORAL

## 2020-04-04 MED ORDER — ONDANSETRON HCL 4 MG PO TABS
4.0000 mg | ORAL_TABLET | Freq: Four times a day (QID) | ORAL | Status: DC | PRN
  Administered 2020-04-07: 4 mg via ORAL
  Filled 2020-04-04: qty 1

## 2020-04-04 MED ORDER — ONDANSETRON HCL 4 MG/2ML IJ SOLN
4.0000 mg | Freq: Once | INTRAMUSCULAR | Status: AC
  Administered 2020-04-04: 4 mg via INTRAVENOUS
  Filled 2020-04-04: qty 2

## 2020-04-04 MED ORDER — LORAZEPAM 2 MG/ML IJ SOLN: 0.0000 mg | Freq: Two times a day (BID) | INTRAMUSCULAR | Status: DC

## 2020-04-04 MED ORDER — ONDANSETRON HCL 4 MG/2ML IJ SOLN: 4.0000 mg | Freq: Four times a day (QID) | INTRAMUSCULAR | Status: DC | PRN

## 2020-04-04 MED ORDER — THIAMINE HCL 100 MG/ML IJ SOLN
100.0000 mg | Freq: Every day | INTRAMUSCULAR | Status: DC
  Administered 2020-04-07 – 2020-04-08 (×2): 100 mg via INTRAVENOUS
  Filled 2020-04-04 (×3): qty 2

## 2020-04-04 MED ORDER — NALOXONE HCL 0.4 MG/ML IJ SOLN: 0.4000 mg | INTRAMUSCULAR | Status: DC | PRN

## 2020-04-04 MED ORDER — PIPERACILLIN-TAZOBACTAM 3.375 G IVPB 30 MIN: 3.3750 g | Freq: Three times a day (TID) | INTRAVENOUS | Status: DC

## 2020-04-04 MED ORDER — SODIUM CHLORIDE 0.9 % IV SOLN
3.0000 g | Freq: Four times a day (QID) | INTRAVENOUS | Status: DC
  Administered 2020-04-04 – 2020-04-07 (×12): 3 g via INTRAVENOUS
  Filled 2020-04-04 (×17): qty 8

## 2020-04-04 MED ORDER — HYDROXYZINE HCL 25 MG PO TABS
25.0000 mg | ORAL_TABLET | ORAL | Status: DC | PRN
  Administered 2020-04-05: 25 mg via ORAL
  Filled 2020-04-04: qty 1

## 2020-04-04 MED ORDER — TRAZODONE HCL 50 MG PO TABS: 50.0000 mg | ORAL_TABLET | Freq: Every evening | ORAL | Status: DC | PRN

## 2020-04-04 MED ORDER — VANCOMYCIN HCL 1500 MG/300ML IV SOLN
1500.0000 mg | INTRAVENOUS | Status: DC
  Administered 2020-04-05 – 2020-04-07 (×3): 1500 mg via INTRAVENOUS
  Filled 2020-04-04 (×3): qty 300

## 2020-04-04 MED ORDER — LACTATED RINGERS IV SOLN: INTRAVENOUS | Status: AC

## 2020-04-04 MED ORDER — HALOPERIDOL LACTATE 5 MG/ML IJ SOLN: 2.0000 mg | INTRAMUSCULAR | Status: DC | PRN

## 2020-04-04 MED ORDER — IPRATROPIUM-ALBUTEROL 0.5-2.5 (3) MG/3ML IN SOLN: 3.0000 mL | RESPIRATORY_TRACT | Status: DC | PRN

## 2020-04-04 MED ORDER — MIDODRINE HCL 5 MG PO TABS
10.0000 mg | ORAL_TABLET | Freq: Three times a day (TID) | ORAL | Status: DC
  Administered 2020-04-04 – 2020-04-05 (×2): 10 mg via ORAL
  Filled 2020-04-04 (×2): qty 2

## 2020-04-04 MED ORDER — LORAZEPAM 2 MG/ML IJ SOLN: 1.0000 mg | INTRAMUSCULAR | Status: DC | PRN

## 2020-04-04 MED ORDER — ADULT MULTIVITAMIN W/MINERALS CH
1.0000 | ORAL_TABLET | Freq: Every day | ORAL | Status: DC
  Administered 2020-04-04 – 2020-04-08 (×5): 1 via ORAL
  Filled 2020-04-04 (×5): qty 1

## 2020-04-04 MED ORDER — VANCOMYCIN HCL 1500 MG/300ML IV SOLN
1500.0000 mg | Freq: Once | INTRAVENOUS | Status: AC
  Administered 2020-04-04: 1500 mg via INTRAVENOUS
  Filled 2020-04-04: qty 300

## 2020-04-04 MED ORDER — ACETAMINOPHEN 325 MG PO TABS
650.0000 mg | ORAL_TABLET | Freq: Four times a day (QID) | ORAL | Status: DC | PRN
  Administered 2020-04-05 – 2020-04-06 (×3): 650 mg via ORAL
  Filled 2020-04-04 (×3): qty 2

## 2020-04-04 MED ORDER — PIPERACILLIN-TAZOBACTAM 3.375 G IVPB 30 MIN
3.3750 g | Freq: Once | INTRAVENOUS | Status: AC
  Administered 2020-04-04: 3.375 g via INTRAVENOUS
  Filled 2020-04-04: qty 50

## 2020-04-04 MED ORDER — MUPIROCIN CALCIUM 2 % EX CREA
TOPICAL_CREAM | Freq: Three times a day (TID) | CUTANEOUS | Status: DC
  Administered 2020-04-05 – 2020-04-06 (×2): 1 via TOPICAL
  Filled 2020-04-04: qty 15

## 2020-04-04 MED ORDER — SODIUM CHLORIDE 0.9 % IV BOLUS
1000.0000 mL | Freq: Once | INTRAVENOUS | Status: AC
  Administered 2020-04-04: 1000 mL via INTRAVENOUS

## 2020-04-04 MED ORDER — PIPERACILLIN-TAZOBACTAM 3.375 G IVPB: 3.3750 g | Freq: Three times a day (TID) | INTRAVENOUS | Status: DC

## 2020-04-04 MED ORDER — LACTATED RINGERS IV BOLUS
1000.0000 mL | Freq: Once | INTRAVENOUS | Status: AC
  Administered 2020-04-04: 1000 mL via INTRAVENOUS

## 2020-04-04 MED ORDER — NICOTINE 14 MG/24HR TD PT24
14.0000 mg | MEDICATED_PATCH | Freq: Every day | TRANSDERMAL | Status: DC
  Administered 2020-04-04 – 2020-04-08 (×5): 14 mg via TRANSDERMAL
  Filled 2020-04-04 (×5): qty 1

## 2020-04-04 MED ORDER — LORAZEPAM 1 MG PO TABS: 1.0000 mg | ORAL_TABLET | ORAL | Status: DC | PRN

## 2020-04-04 MED ORDER — LORAZEPAM 2 MG/ML IJ SOLN
0.0000 mg | Freq: Four times a day (QID) | INTRAMUSCULAR | Status: DC
  Administered 2020-04-04 (×2): 2 mg via INTRAVENOUS
  Filled 2020-04-04 (×2): qty 1

## 2020-04-04 MED ORDER — THIAMINE HCL 100 MG PO TABS
100.0000 mg | ORAL_TABLET | Freq: Every day | ORAL | Status: DC
  Administered 2020-04-04 – 2020-04-06 (×3): 100 mg via ORAL
  Filled 2020-04-04 (×5): qty 1

## 2020-04-04 MED ORDER — FOLIC ACID 1 MG PO TABS
1.0000 mg | ORAL_TABLET | Freq: Every day | ORAL | Status: DC
  Administered 2020-04-04 – 2020-04-08 (×5): 1 mg via ORAL
  Filled 2020-04-04 (×5): qty 1

## 2020-04-04 MED ORDER — MUPIROCIN 2 % EX OINT
1.0000 "application " | TOPICAL_OINTMENT | Freq: Two times a day (BID) | CUTANEOUS | Status: AC
  Administered 2020-04-04 – 2020-04-08 (×10): 1 via NASAL
  Filled 2020-04-04: qty 22

## 2020-04-04 MED ORDER — CHLORHEXIDINE GLUCONATE CLOTH 2 % EX PADS
6.0000 | MEDICATED_PAD | Freq: Every day | CUTANEOUS | Status: DC
  Administered 2020-04-04 – 2020-04-07 (×4): 6 via TOPICAL

## 2020-04-04 NOTE — ED Triage Notes (Signed)
Pt arrives via [facility] for drug overdose. Pt found unresponsive in bathroom with empty syringe. Pt given narcan by [employer]. Pt states she doesn't remember what happened. Pt noted to have multiple track marks on bilateral arms.

## 2020-04-04 NOTE — ED Notes (Signed)
Pt 02 dropping into the 60s. EDP made aware and at bedside. Pt placed on nonrebreather with a sat of 97%.

## 2020-04-04 NOTE — ED Notes (Signed)
Pt is calm and sleeping at this time. Pt denied any needs. Vitals are WNL. Will continue to monitor pt.

## 2020-04-04 NOTE — Progress Notes (Signed)
RT stuck patient arterially for lab work.

## 2020-04-04 NOTE — Progress Notes (Signed)
PROGRESS NOTE    Patient: [name]                            PCP: Merlyn Albert, MD (Inactive)                    DOB: 1980-06-17            DOA: 04/04/2020 [id number]             DOS: 04/04/2020, 7:08 AM   LOS: 0 days   Date of Service: The patient was seen and examined on 04/04/2020  Subjective:   The patient was seen and examined this morning. Remains somnolent, hypotensive...  Satting 100% on O2 via Ventimask  Brief Narrative:   [name]  is a 40 y.o. female, with history of IV drug abuse, nephrolithiasis, depression, anxiety, and more presents the ED with a chief complaint of drug overdose.  Per chart review patient was found in the bathroom of her home unresponsive with IV drug paraphernalia surrounding her.   She was given Narcan by EMS and was transported to the hospital.  She remains somnolent, is experiencing nausea vomiting. Patient opens her eyes to sternal rub, withdraws from pain, mumbles incoherently.  At this time she is on nonrebreather to maintain her oxygen sats.   Chest x-ray showed aspiration pneumonia.  She did slight leukocytosis at 11.2 likely acute phase reactant.   UDS and UA still pending.  Unfortunately no further history can be obtained.   Assessment & Plan:   Active Problems:   Drug overdose    Aspiration Pneumonia -acute respiratory distress 1. O2 demand improved 100% on Ventimask, will wean off accordingly 2. 2/2 emesis during overdose 3. Xray shows aspiration 4. Rhonchi BL on lung exam 5. Zosyn will continue for now 6. Obtaining blood cultures 7.  Acute metabolic encephalopathy 1. 2/2 Heroin overdose 2. UDS pending 3. Continue to monitor  4. Narcan PRN 5. Slowly waking up  Hypotensive   -Initiating another liter of lactated Ringer bolus  -We will continue with maintenance fluid lactated Ringer's, monitoring blood pressure  closely   Drug overdose 1. Unclear if intentional or not 2. Patient states that she thought  she took Tylenol but she is not sure 3. May need psych assessment 4. Continue to monitor 5. Urine drug screen still pending  Abscesses  2 abscesses, one is located on the left neck, the other is located in the middle of her abdomen 1. MRSA swab, for possible de-escalation of antibiotics 2. Vancomycin and Zosyn initiated upon admission 3.  Acute respiratory failure 1. 2/2 opiate overdose and aspiration pneumonia 2. Requiring NRB to maintain O2 sats 3. Wean off as tolerated   History of depression -Home medication of Wellbutrin, Atarax, Lexapro, trazodone.Marland Kitchen all  on hold  Tobacco abuse  Patient counseled regarding smoking cessation  NicoDerm patch offered    ----------------------------------------------------------------------------------------------------------------------------------------- Cultures; Blood Cultures x 2 >> NGT   Antimicrobials: IV vancomycin/Zosyn   Consultants: None   -----------------------------------------------------------------------------------------------------------------------------------------  DVT prophylaxis:  SCD/Compression stockings and Heparin SQ Code Status:   Code Status: Full Code  Family Communication: No family member present at bedside - attempt will be made to update daily  The above findings and plan of care has been discussed with patient   in detail,  they expressed understanding and agreement of above. -Advance care planning has been discussed.   Admission status:   Status is: Observation  The patient remains OBS appropriate and will d/c before 2 midnights.  Dispo: The patient is from: Home              Anticipated d/c is to: Home              Patient currently is not medically stable to d/c.   Difficult to place patient No      Level of care: Stepdown   Procedures:   No admission procedures for hospital encounter.     Antimicrobials:  Anti-infectives (From admission, onward)   Start     Dose/Rate  Route Frequency Ordered Stop   04/05/20 0400  vancomycin (VANCOREADY) IVPB 1500 mg/300 mL        1,500 mg 150 mL/hr over 120 Minutes Intravenous Every 24 hours 04/04/20 0434     04/04/20 1200  piperacillin-tazobactam (ZOSYN) IVPB 3.375 g        3.375 g 12.5 mL/hr over 240 Minutes Intravenous Every 8 hours 04/04/20 0423     04/04/20 0600  piperacillin-tazobactam (ZOSYN) IVPB 3.375 g  Status:  Discontinued        3.375 g 100 mL/hr over 30 Minutes Intravenous Every 8 hours 04/04/20 0415 04/04/20 0421   04/04/20 0430  piperacillin-tazobactam (ZOSYN) IVPB 3.375 g        3.375 g 100 mL/hr over 30 Minutes Intravenous  Once 04/04/20 0423 04/04/20 0534   04/04/20 0430  vancomycin (VANCOREADY) IVPB 1500 mg/300 mL        1,500 mg 150 mL/hr over 120 Minutes Intravenous  Once 04/04/20 0426     04/04/20 0345  levofloxacin (LEVAQUIN) IVPB 750 mg  Status:  Discontinued        750 mg 100 mL/hr over 90 Minutes Intravenous  Once 04/04/20 0339 04/04/20 0415       Medication:  . buPROPion  150 mg Oral Daily  . escitalopram  30 mg Oral Daily  . folic acid  1 mg Oral Daily  . heparin  5,000 Units Subcutaneous Q8H  . LORazepam  0-4 mg Intravenous Q6H   Followed by  . [START ON 04/06/2020] LORazepam  0-4 mg Intravenous Q12H  . multivitamin with minerals  1 tablet Oral Daily  . nicotine  14 mg Transdermal Daily  . thiamine  100 mg Oral Daily   Or  . thiamine  100 mg Intravenous Daily    acetaminophen **OR** acetaminophen, hydrOXYzine, ipratropium-albuterol, LORazepam **OR** LORazepam, naLOXone (NARCAN)  injection, ondansetron **OR** ondansetron (ZOFRAN) IV, traZODone   Objective:   Vitals:   04/04/20 0505 04/04/20 0535 04/04/20 0600 04/04/20 0651  BP: 95/70 92/70 98/76    Pulse: (!) 111 (!) 105 (!) 105   Resp: 17 20 19    Temp:    99.4 F (37.4 C)  TempSrc:    Oral  SpO2: 95% 97% 100%   Weight:    78.9 kg  Height:    5\' 1"  (1.549 m)    Intake/Output Summary (Last 24 hours) at 04/04/2020  0708 Last data filed at 04/04/2020 0534 Gross per 24 hour  Intake 1166.7 ml  Output --  Net 1166.7 ml   Filed Weights   04/04/20 0020 04/04/20 0651  Weight: 72.6 kg 78.9 kg     Examination:   Physical Exam  Constitution:  Alert, cooperative, no distress,  Appears calm and comfortable  Psychiatric: Normal and stable mood and affect, cognition intact,   HEENT: Normocephalic, PERRL, otherwise with in Normal limits  Chest:Chest symmetric Cardio vascular:  S1/S2, RRR, No  murmure, No Rubs or Gallops  pulmonary: Clear to auscultation bilaterally, respirations unlabored, negative wheezes / crackles Abdomen: Soft, non-tender, non-distended, bowel sounds,no masses, no organomegaly Muscular skeletal: Limited exam - in bed, able to move all 4 extremities, Normal strength,  Neuro: CNII-XII intact. , normal motor and sensation, reflexes intact  Extremities: No pitting edema lower extremities, +2 pulses  Skin: Dry, warm to touch, negative for any Rashes, No open wounds 2 abscesses, one is located on the left neck, the other is located in the middle of her abdomen both open, purulent drainage Wounds: per nursing documentation    ------------------------------------------------------------------------------------------------------------------------------------------    LABs:  CBC Latest Ref Rng & Units 04/04/2020 04/04/2020 03/27/2019  WBC 4.0 - 10.5 K/uL 7.2 11.2(H) 5.8  Hemoglobin 12.0 - 15.0 g/dL 21.3 08.6 57.8  Hematocrit 36.0 - 46.0 % 49.8(H) 40.5 41.9  Platelets 150 - 400 K/uL 391 451(H) 262   CMP Latest Ref Rng & Units 04/04/2020 04/04/2019 03/27/2019  Glucose 70 - 99 mg/dL 469(G) - 295(M)  BUN 6 - 20 mg/dL 15 - 8  Creatinine 8.41 - 1.00 mg/dL 3.24 - 4.01  Sodium 027 - 145 mmol/L 139 - 139  Potassium 3.5 - 5.1 mmol/L 3.7 - 3.5  Chloride 98 - 111 mmol/L 109 - 105  CO2 22 - 32 mmol/L 24 - 25  Calcium 8.9 - 10.3 mg/dL 8.0(L) - 8.7(L)  Total Protein 6.5 - 8.1 g/dL 6.6 6.9 6.5  Total  Bilirubin 0.3 - 1.2 mg/dL 2.5(D) 0.5 0.6  Alkaline Phos 38 - 126 U/L 58 45 51  AST 15 - 41 U/L 36 33 47(H)  ALT 0 - 44 U/L 68(H) 65(H) 57(H)       Micro Results Recent Results (from the past 240 hour(s))  Resp Panel by RT-PCR (Flu A&B, Covid) Nasopharyngeal Swab     Status: None   Collection Time: 04/04/20  4:16 AM   Specimen: Nasopharyngeal Swab; Nasopharyngeal(NP) swabs in vial transport medium  Result Value Ref Range Status   SARS Coronavirus 2 by RT PCR NEGATIVE NEGATIVE Final    Comment: (NOTE) SARS-CoV-2 target nucleic acids are NOT DETECTED.  The SARS-CoV-2 RNA is generally detectable in upper respiratory specimens during the acute phase of infection. The lowest concentration of SARS-CoV-2 viral copies this assay can detect is 138 copies/mL. A negative result does not preclude SARS-Cov-2 infection and should not be used as the sole basis for treatment or other patient management decisions. A negative result may occur with  improper specimen collection/handling, submission of specimen other than nasopharyngeal swab, presence of viral mutation(s) within the areas targeted by this assay, and inadequate number of viral copies(<138 copies/mL). A negative result must be combined with clinical observations, patient history, and epidemiological information. The expected result is Negative.  Fact Sheet for Patients:  BloggerCourse.com  Fact Sheet for Healthcare Providers:  SeriousBroker.it  This test is no t yet approved or cleared by the [place] FDA and  has been authorized for detection and/or diagnosis of SARS-CoV-2 by FDA under an Emergency Use Authorization (EUA). This EUA will remain  in effect (meaning this test can be used) for the duration of the COVID-19 declaration under Section 564(b)(1) of the Act, 21 U.S.C.section 360bbb-3(b)(1), unless the authorization is terminated  or revoked sooner.       Influenza  A by PCR NEGATIVE NEGATIVE Final   Influenza B by PCR NEGATIVE NEGATIVE Final    Comment: (NOTE) The Xpert Xpress SARS-CoV-2/FLU/RSV plus assay is intended as an  aid in the diagnosis of influenza from Nasopharyngeal swab specimens and should not be used as a sole basis for treatment. Nasal washings and aspirates are unacceptable for Xpert Xpress SARS-CoV-2/FLU/RSV testing.  Fact Sheet for Patients: BloggerCourse.comhttps://www.fda.gov/media/152166/download  Fact Sheet for Healthcare Providers: SeriousBroker.ithttps://www.fda.gov/media/152162/download  This test is not yet approved or cleared by the [place] FDA and has been authorized for detection and/or diagnosis of SARS-CoV-2 by FDA under an Emergency Use Authorization (EUA). This EUA will remain in effect (meaning this test can be used) for the duration of the COVID-19 declaration under Section 564(b)(1) of the Act, 21 U.S.C. section 360bbb-3(b)(1), unless the authorization is terminated or revoked.  Performed at [facility], [address] [place], [place] [address]     Radiology Reports DG Chest [place]  Result Date: 04/04/2020 CLINICAL DATA:  Hypoxia and recent overdose EXAM: PORTABLE CHEST 1 VIEW COMPARISON:  11/20/2019 FINDINGS: Cardiac shadow is within normal limits. The lungs are well aerated bilaterally. Patchy airspace opacities are noted throughout both lungs but primarily within the bases right greater than left. These changes likely represent pulmonary edema following the known overdose. Possibility of aspiration would deserve consideration as well. No bony abnormality is noted. IMPRESSION: Vascular congestion and diffuse airspace opacities as described. Electronically Signed   By: Alcide CleverMark  Lukens M.D.   On: 04/04/2020 03:14    SIGNED: Kendell BaneSeyed A Yanci Bachtell, MD, FHM. [facility],  Pager (please use [email or web address] to page/text) Please use Epic Secure Chat for non-urgent communication (7AM-7PM)  If 7PM-7AM, please contact  night-coverage [email or web address], 04/04/2020, 7:08 AM

## 2020-04-04 NOTE — Progress Notes (Signed)
Pharmacy Antibiotic Note  [name] is a 40 y.o. female admitted on 04/04/2020 with cellulitis.  Pharmacy has been consulted for vancomycin dosing.  Plan: Vancomycin 1500mg  IV Q24H. Goal AUC 400-550.  Expected AUC 440.  SCr used 0.84. Also started on Zosyn appropriately.   Height: 5\' 3"  (160 cm) Weight: 72.6 kg (160 lb) IBW/kg (Calculated) : 52.4  Temp (24hrs), Avg:97.4 F (36.3 C), Min:97.4 F (36.3 C), Max:97.4 F (36.3 C)  Recent Labs  Lab 04/04/20 0307  WBC 11.2*  CREATININE 0.84    Estimated Creatinine Clearance: 85 mL/min (by C-G formula based on SCr of 0.84 mg/dL).    Allergies  Allergen Reactions  . Motrin [Ibuprofen]     Hives emesis  . Hydrocodone Hives  . Penicillins Nausea And Vomiting  . Ultram [Tramadol] Nausea And Vomiting  . Aspirin Nausea And Vomiting and Rash     Thank you for allowing pharmacy to be a part of this patient's care.  , PharmD, BCPS  04/04/2020 4:32 AM

## 2020-04-04 NOTE — Progress Notes (Signed)
Patients pressure is currently 82/35 MAP 49. Shahmehdi ,MD notified after Bolus given, Provider told me to continue to monitor and will add pressor possibly later.

## 2020-04-04 NOTE — H&P (Addendum)
TRH H&P    Patient Demographics:    [name], is a 40 y.o. female  MRN: [id number]  DOB - 1980-05-22  Admit Date - 04/04/2020  Referring MD/NP/PA: Judd Lien  Outpatient Primary MD for the patient is Luking, Vilinda Blanks, MD (Inactive)  Patient coming from: Home  Chief complaint- Drug overdose   HPI:    [name]  is a 40 y.o. female, with history of IV drug abuse, nephrolithiasis, depression, anxiety, and more presents the ED with a chief complaint of drug overdose.  Per chart review patient was found in the bathroom of her home unresponsive with IV drug paraphernalia surrounding her.  She was given Narcan by EMS and was transported to the hospital.  She remains somnolent, is experiencing nausea vomiting.  Patient opens her eyes to sternal rub, withdraws from pain, mumbles incoherently.  At this time she is on nonrebreather to maintain her oxygen sats.  Chest x-ray showed aspiration pneumonia.  She did slight leukocytosis at 11.2 likely acute phase reactant.  UDS and UA still pending.  Unfortunately no further history can be obtained.    Review of systems:    Could not be obtained secondary to altered mental status   Past History of the following :    Past Medical History:  Diagnosis Date  . Anxiety    takes Xanax daily as needed  . Chronic back pain    herniated disc  . Chronic pelvic pain in female   . Depression    takes Lexapro daily  . Drug-seeking behavior   . Headache    constant  . History of bronchitis    yrs ago  . History of kidney stones   . Insomnia    takes Elavil nightly  . IV drug abuse (HCC)   . Nocturia   . Seasonal allergies    Claritin and FLonase daily as needed      Past Surgical History:  Procedure Laterality Date  . CHOLECYSTECTOMY    . I & D EXTREMITY Left 08/03/2016   Procedure: IRRIGATION AND DEBRIDEMENT LEFT HAND;  Surgeon: Dominica Severin, MD;  Location: [facility];  Service: Orthopedics;  Laterality: Left;  . THORACIC DISCECTOMY Right 03/28/2014   Procedure: RIGHT THORACIC ELEVEN-TWELVE MICRODICSECTOMY;  Surgeon: Tressie Stalker, MD;  Location: [facility];  Service: Neurosurgery;  Laterality: Right;  right  . TUBAL LIGATION        Social History:      Social History   Tobacco Use  . Smoking status: Current Every Day Smoker    Packs/day: 0.50    Years: 0.00    Pack years: 0.00    Types: Cigarettes  . Smokeless tobacco: Never Used  Substance Use Topics  . Alcohol use: Yes    Comment: occ       Family History :     Family History  Problem Relation Age of Onset  . Hypertension Mother   . Hypertension Father   . Hypertension Sister   . Stroke Other   . Cancer Other   .  Colon cancer Neg Hx       Home Medications:   Prior to Admission medications   Medication Sig Start Date End Date Taking? Authorizing Provider  buPROPion (WELLBUTRIN SR) 150 MG 12 hr tablet Take 1 tablet (150 mg total) by mouth daily. For depression 04/06/19   Armandina Stammer I, NP  escitalopram (LEXAPRO) 10 MG tablet Take 3 tablets (30 mg total) by mouth daily. For depression 04/06/19   Armandina Stammer I, NP  hydrOXYzine (ATARAX/VISTARIL) 25 MG tablet Take 1 tablet (25 mg total) by mouth every 4 (four) hours as needed for itching or anxiety. 04/05/19   Armandina Stammer I, NP  nicotine (NICODERM CQ - DOSED IN MG/24 HOURS) 14 mg/24hr patch Place 1 patch (14 mg total) onto the skin daily. (May buy from over the counter): For smoking cessation 04/06/19   Armandina Stammer I, NP  traZODone (DESYREL) 50 MG tablet Take 1 tablet (50 mg total) by mouth at bedtime as needed for sleep. 04/05/19   Sanjuana Kava, NP     Allergies:     Allergies  Allergen Reactions  . Motrin [Ibuprofen]     Hives emesis  . Hydrocodone Hives  . Penicillins Nausea And Vomiting  . Ultram [Tramadol] Nausea And Vomiting  . Aspirin Nausea And Vomiting and Rash     Physical Exam:   Vitals  Blood pressure  135/87, pulse (!) 103, temperature (!) 97.4 F (36.3 C), temperature source Oral, resp. rate (!) 22, height 5\' 3"  (1.6 m), weight 72.6 kg, SpO2 94 %.  1.  General: Patient supine in bed with head of bed elevated, coughing, on nonrebreather  2. Psychiatric: Could not be assessed as patient is not able to participate in medical interview  3. Neurologic: Cannot be assessed as patient was not following commands, face symmetric, moves all 4 extremities to withdraw from pain, protecting airway  4. HEENMT:  Head is atraumatic, normocephalic, pupils sluggish but reactive to light, neck is supple, trachea is midline  5. Respiratory : Coarse rhonchi throughout bilateral lung fields, no crackles, mild wheezes intermixed, no clubbing or cyanosis  6. Cardiovascular : Heart rate is tachycardic, rhythm is regular, no murmurs rubs or gallops  7. Gastrointestinal:  Abdomen is soft, nondistended, nontender to palpation  8. Skin:  There are 2 abscesses, one is located on the left neck, the other is located in the middle of her abdomen.  They are both open, purulent drainage at the time of my exam, there is surrounding erythema,   9.Musculoskeletal:  No calf tenderness, no acute deformity    Data Review:    CBC Recent Labs  Lab 04/04/20 0307  WBC 11.2*  HGB 12.2  HCT 40.5  PLT 451*  MCV 90.4  MCH 27.2  MCHC 30.1  RDW 15.9*  LYMPHSABS 1.4  MONOABS 0.3  EOSABS 0.1  BASOSABS 0.1   ------------------------------------------------------------------------------------------------------------------  Results for orders placed or performed during the hospital encounter of 04/04/20 (from the past 48 hour(s))  CBC with Differential     Status: Abnormal   Collection Time: 04/04/20  3:07 AM  Result Value Ref Range   WBC 11.2 (H) 4.0 - 10.5 K/uL   RBC 4.48 3.87 - 5.11 MIL/uL   Hemoglobin 12.2 12.0 - 15.0 g/dL   HCT 06/04/20 76.1 - 60.7 %   MCV 90.4 80.0 - 100.0 fL   MCH 27.2 26.0 - 34.0 pg    MCHC 30.1 30.0 - 36.0 g/dL   RDW 37.1 (H) 06.2 -  15.5 %   Platelets 451 (H) 150 - 400 K/uL   nRBC 0.0 0.0 - 0.2 %   Neutrophils Relative % 83 %   Neutro Abs 9.3 (H) 1.7 - 7.7 K/uL   Lymphocytes Relative 12 %   Lymphs Abs 1.4 0.7 - 4.0 K/uL   Monocytes Relative 3 %   Monocytes Absolute 0.3 0.1 - 1.0 K/uL   Eosinophils Relative 1 %   Eosinophils Absolute 0.1 0.0 - 0.5 K/uL   Basophils Relative 1 %   Basophils Absolute 0.1 0.0 - 0.1 K/uL   Immature Granulocytes 0 %   Abs Immature Granulocytes 0.03 0.00 - 0.07 K/uL    Comment: Performed at [facility], [address] [place], [place] [address]  Comprehensive metabolic panel     Status: Abnormal   Collection Time: 04/04/20  3:07 AM  Result Value Ref Range   Sodium 139 135 - 145 mmol/L   Potassium 3.7 3.5 - 5.1 mmol/L   Chloride 109 98 - 111 mmol/L   CO2 24 22 - 32 mmol/L   Glucose, Bld 121 (H) 70 - 99 mg/dL    Comment: Glucose reference range applies only to samples taken after fasting for at least 8 hours.   BUN 15 6 - 20 mg/dL   Creatinine, Ser 2.97 0.44 - 1.00 mg/dL   Calcium 8.0 (L) 8.9 - 10.3 mg/dL   Total Protein 6.6 6.5 - 8.1 g/dL   Albumin 2.9 (L) 3.5 - 5.0 g/dL   AST 36 15 - 41 U/L   ALT 68 (H) 0 - 44 U/L   Alkaline Phosphatase 58 38 - 126 U/L   Total Bilirubin 0.1 (L) 0.3 - 1.2 mg/dL   GFR, Estimated >98 >92 mL/min    Comment: (NOTE) Calculated using the CKD-EPI Creatinine Equation (2021)    Anion gap 6 5 - 15    Comment: Performed at [facility], [address] [place], [place] [address]  Ethanol     Status: None   Collection Time: 04/04/20  3:07 AM  Result Value Ref Range   Alcohol, Ethyl (B) <10 <10 mg/dL    Comment: (NOTE) Lowest detectable limit for serum alcohol is 10 mg/dL.  For medical purposes only. Performed at [facility], [address], [place], [place] [address]   hCG, serum, qualitative     Status: None   Collection Time: 04/04/20  3:07 AM  Result Value Ref Range   Preg, Serum NEGATIVE  NEGATIVE    Comment:        THE SENSITIVITY OF THIS METHODOLOGY IS >10 mIU/mL. Performed at [facility], [address] [place], [place] [address]     Chemistries  Recent Labs  Lab 04/04/20 0307  NA 139  K 3.7  CL 109  CO2 24  GLUCOSE 121*  BUN 15  CREATININE 0.84  CALCIUM 8.0*  AST 36  ALT 68*  ALKPHOS 58  BILITOT 0.1*   ------------------------------------------------------------------------------------------------------------------  ------------------------------------------------------------------------------------------------------------------ GFR: Estimated Creatinine Clearance: 85 mL/min (by C-G formula based on SCr of 0.84 mg/dL). Liver Function Tests: Recent Labs  Lab 04/04/20 0307  AST 36  ALT 68*  ALKPHOS 58  BILITOT 0.1*  PROT 6.6  ALBUMIN 2.9*   No results for input(s): LIPASE, AMYLASE in the last 168 hours. No results for input(s): AMMONIA in the last 168 hours. Coagulation Profile: No results for input(s): INR, PROTIME in the last 168 hours. Cardiac Enzymes: No results for input(s): CKTOTAL, CKMB, CKMBINDEX, TROPONINI in the last 168 hours.  BNP (last 3 results) No results for input(s): PROBNP in the last 8760 hours. HbA1C: No results for input(s): HGBA1C in the last 72 hours. CBG: No results for input(s): GLUCAP in the last 168 hours. Lipid Profile: No results for input(s): CHOL, HDL, LDLCALC, TRIG, CHOLHDL, LDLDIRECT in the last 72 hours. Thyroid Function Tests: No results for input(s): TSH, T4TOTAL, FREET4, T3FREE, THYROIDAB in the last 72 hours. Anemia Panel: No results for input(s): VITAMINB12, FOLATE, FERRITIN, TIBC, IRON, RETICCTPCT in the last 72 hours.  --------------------------------------------------------------------------------------------------------------- Urine analysis:    Component Value Date/Time   COLORURINE YELLOW 04/03/2014 1830   APPEARANCEUR CLEAR 04/03/2014 1830   LABSPEC 1.010 04/03/2014 1830   PHURINE 7.5  04/03/2014 1830   GLUCOSEU NEGATIVE 04/03/2014 1830   HGBUR NEGATIVE 04/03/2014 1830   BILIRUBINUR NEGATIVE 04/03/2014 1830   KETONESUR NEGATIVE 04/03/2014 1830   PROTEINUR NEGATIVE 04/03/2014 1830   UROBILINOGEN 1.0 04/03/2014 1830   NITRITE NEGATIVE 04/03/2014 1830   LEUKOCYTESUR NEGATIVE 04/03/2014 1830      Imaging Results:    DG Chest Port 1 View  Result Date: 04/04/2020 CLINICAL DATA:  Hypoxia and recent overdose EXAM: PORTABLE CHEST 1 VIEW COMPARISON:  11/20/2019 FINDINGS: Cardiac shadow is within normal limits. The lungs are well aerated bilaterally. Patchy airspace opacities are noted throughout both lungs but primarily within the bases right greater than left. These changes likely represent pulmonary edema following the known overdose. Possibility of aspiration would deserve consideration as well. No bony abnormality is noted. IMPRESSION: Vascular congestion and diffuse airspace opacities as described. Electronically Signed   By: Alcide CleverMark  Lukens M.D.   On: 04/04/2020 03:14       Assessment & Plan:    Active Problems:   Drug overdose   1. Aspiration Pneumonia 1. 2/2 emesis during overdose 2. Xray shows aspiration 3. Rhonchi BL on lung exam 4. Zosyn started 2. Acute metabolic encephalopathy 1. 2/2 Heroin overdose 2. UDS pending 3. Continue to monitor  4. Narcan PRN 3. Drug overdose 1. Unclear if intentional or not 2. May need psych assessment 3. Continue to monitor 4. Abscesses 1. MRSA swab, for possible de-escalation of antibiotics 5. Acute respiratory failure 1. 2/2 opiate overdose and aspiration pneumonia 2. Requiring NRB to maintain O2 sats 3. Wean off as tolerated   DVT Prophylaxis-  heparin- SCDs  AM Labs Ordered, also please review Full Orders  Family Communication: No family at bedside Code Status:  Full  Admission status: Observation Time spent in minutes : 65   Bryn Saline B Zierle-Ghosh DO

## 2020-04-04 NOTE — ED Provider Notes (Signed)
[facility] EMERGENCY DEPARTMENT Provider Note   CSN: [id number] Arrival date & time: 04/04/20  0013     History Chief Complaint  Patient presents with  . Drug Overdose    [name] is a 40 y.o. female.  Patient is a 40 year old female with history of polysubstance abuse.  She is brought today for evaluation of unresponsiveness.  She was apparently found in the bathroom of her home unresponsive with IV drug paraphernalia nearby.  Patient received Narcan by EMS, then was transported here.  She has vomited and is somewhat altered.  She denies any specific aches or pains.  She does admit to using heroin this evening.  The history is provided by the patient.  Drug Overdose This is a new problem. The problem occurs constantly. Nothing aggravates the symptoms. Nothing relieves the symptoms. She has tried nothing for the symptoms.       Past Medical History:  Diagnosis Date  . Anxiety    takes Xanax daily as needed  . Chronic back pain    herniated disc  . Chronic pelvic pain in female   . Depression    takes Lexapro daily  . Drug-seeking behavior   . Headache    constant  . History of bronchitis    yrs ago  . History of kidney stones   . Insomnia    takes Elavil nightly  . IV drug abuse (HCC)   . Nocturia   . Seasonal allergies    Claritin and FLonase daily as needed    Patient Active Problem List   Diagnosis Date Noted  . Amphetamine use disorder, severe, dependence (HCC) 03/30/2019  . Bipolar 1 disorder (HCC) 03/29/2019  . Suicide attempt (HCC) 03/29/2019  . Skin ulcer (HCC) 08/03/2016  . Cellulitis 08/03/2016  . Cellulitis of hand, left   . Hepatitis C antibody test positive 01/09/2015  . Abdominal pain 01/09/2015  . Constipation 01/09/2015  . GERD (gastroesophageal reflux disease) 01/09/2015  . Abnormal liver enzymes 12/10/2014  . Acute hepatitis C virus infection without hepatic coma 12/10/2014  . Insomnia 09/12/2014  . Postoperative pain 04/03/2014   . Thoracic spondylosis 04/03/2014  . Herniated nucleus pulposus, thoracic 03/28/2014  . Anxiety and depression 11/29/2013  . Bipolar disorder, unspecified (HCC) 08/14/2013  . Panic attacks 08/14/2013  . Chronic back pain 08/14/2013    Past Surgical History:  Procedure Laterality Date  . CHOLECYSTECTOMY    . I & D EXTREMITY Left 08/03/2016   Procedure: IRRIGATION AND DEBRIDEMENT LEFT HAND;  Surgeon: Dominica Severin, MD;  Location: [facility];  Service: Orthopedics;  Laterality: Left;  . THORACIC DISCECTOMY Right 03/28/2014   Procedure: RIGHT THORACIC ELEVEN-TWELVE MICRODICSECTOMY;  Surgeon: Tressie Stalker, MD;  Location: [facility];  Service: Neurosurgery;  Laterality: Right;  right  . TUBAL LIGATION       OB History    Gravida  3   Para  3   Term  3   Preterm      AB      Living  3     SAB      IAB      Ectopic      Multiple      Live Births              Family History  Problem Relation Age of Onset  . Hypertension Mother   . Hypertension Father   . Hypertension Sister   . Stroke Other   . Cancer Other   .  Colon cancer Neg Hx     Social History   Tobacco Use  . Smoking status: Current Every Day Smoker    Packs/day: 0.50    Years: 0.00    Pack years: 0.00    Types: Cigarettes  . Smokeless tobacco: Never Used  Vaping Use  . Vaping Use: Never used  Substance Use Topics  . Alcohol use: Yes    Comment: occ  . Drug use: Yes    Types: Marijuana, Methamphetamines    Home Medications Prior to Admission medications   Medication Sig Start Date End Date Taking? Authorizing Provider  buPROPion (WELLBUTRIN SR) 150 MG 12 hr tablet Take 1 tablet (150 mg total) by mouth daily. For depression 04/06/19   Armandina Stammer I, NP  escitalopram (LEXAPRO) 10 MG tablet Take 3 tablets (30 mg total) by mouth daily. For depression 04/06/19   Armandina Stammer I, NP  hydrOXYzine (ATARAX/VISTARIL) 25 MG tablet Take 1 tablet (25 mg total) by mouth every 4 (four) hours as needed for  itching or anxiety. 04/05/19   Armandina Stammer I, NP  nicotine (NICODERM CQ - DOSED IN MG/24 HOURS) 14 mg/24hr patch Place 1 patch (14 mg total) onto the skin daily. (May buy from over the counter): For smoking cessation 04/06/19   Armandina Stammer I, NP  traZODone (DESYREL) 50 MG tablet Take 1 tablet (50 mg total) by mouth at bedtime as needed for sleep. 04/05/19   Armandina Stammer I, NP    Allergies    Motrin [ibuprofen], Hydrocodone, Penicillins, Ultram [tramadol], and Aspirin  Review of Systems   Review of Systems  All other systems reviewed and are negative.   Physical Exam Updated Vital Signs BP 104/72   Pulse 89   Temp (!) 97.4 F (36.3 C) (Oral)   Resp 18   Ht 5\' 3"  (1.6 m)   Wt 72.6 kg   SpO2 95%   BMI 28.34 kg/m   Physical Exam Vitals and nursing note reviewed.  Constitutional:      General: She is not in acute distress.    Appearance: She is well-developed. She is ill-appearing. She is not diaphoretic.     Comments: Patient is a 40 year old female who appears somewhat sluggish.  She is arousable and follows commands and answers questions.  She is moderately ill-appearing.  HENT:     Head: Normocephalic and atraumatic.  Cardiovascular:     Rate and Rhythm: Normal rate and regular rhythm.     Heart sounds: No murmur heard. No friction rub. No gallop.   Pulmonary:     Effort: Pulmonary effort is normal. No respiratory distress.     Breath sounds: Normal breath sounds. No wheezing.  Abdominal:     General: Bowel sounds are normal. There is no distension.     Palpations: Abdomen is soft.     Tenderness: There is no abdominal tenderness.  Musculoskeletal:        General: Normal range of motion.     Cervical back: Normal range of motion and neck supple.  Skin:    General: Skin is warm and dry.  Neurological:     Mental Status: She is alert and oriented to person, place, and time.     ED Results / Procedures / Treatments   Labs (all labs ordered are listed, but only  abnormal results are displayed) Labs Reviewed  CBC WITH DIFFERENTIAL/PLATELET  COMPREHENSIVE METABOLIC PANEL  ETHANOL  RAPID URINE DRUG SCREEN, HOSP PERFORMED  URINALYSIS, ROUTINE W REFLEX MICROSCOPIC  HCG, SERUM, QUALITATIVE    EKG EKG Interpretation  Date/Time:  Friday April 04 2020 03:42:00 EDT Ventricular Rate:  108 PR Interval:  109 QRS Duration: 91 QT Interval:  330 QTC Calculation: 443 R Axis:   82 Text Interpretation: Sinus tachycardia Consider right atrial enlargement Confirmed by Geoffery Lyons ([id number]) on 04/04/2020 4:22:00 AM   Radiology No results found.  Procedures Procedures   Medications Ordered in ED Medications  sodium chloride 0.9 % bolus 1,000 mL (has no administration in time range)  ondansetron (ZOFRAN) injection 4 mg (has no administration in time range)    ED Course  I have reviewed the triage vital signs and the nursing notes.  Pertinent labs & imaging results that were available during my care of the patient were reviewed by me and considered in my medical decision making (see chart for details).    MDM Rules/Calculators/A&P  Patient is a 40 year old female with history of IV drug abuse.  She was found down in the bathroom of her home with hypodermic needles nearby.  She received Narcan by EMS, but vomited several times.  Patient has been somnolent and nauseated.  She was observed for several hours, during which time she became hypoxic.  Chest x-ray shows what I believed to be aspiration pneumonia.  Patient given Levaquin.  Patient has an oxygen requirement and is currently on 15 L by nonrebreather to maintain saturations in the upper 90s.  Patient desaturates when placed on nasal cannula.  Patient to be admitted to the hospitalist service for further treatment.  CRITICAL CARE Performed by: Geoffery Lyons Total critical care time: 35 minutes Critical care time was exclusive of separately billable procedures and treating other  patients. Critical care was necessary to treat or prevent imminent or life-threatening deterioration. Critical care was time spent personally by me on the following activities: development of treatment plan with patient and/or surrogate as well as nursing, discussions with consultants, evaluation of patient's response to treatment, examination of patient, obtaining history from patient or surrogate, ordering and performing treatments and interventions, ordering and review of laboratory studies, ordering and review of radiographic studies, pulse oximetry and re-evaluation of patient's condition.   Final Clinical Impression(s) / ED Diagnoses Final diagnoses:  None    Rx / DC Orders ED Discharge Orders    None       Geoffery Lyons, MD 04/04/20 [phone]

## 2020-04-05 DIAGNOSIS — F32A Depression, unspecified: Secondary | ICD-10-CM | POA: Diagnosis not present

## 2020-04-05 DIAGNOSIS — F3160 Bipolar disorder, current episode mixed, unspecified: Secondary | ICD-10-CM | POA: Diagnosis not present

## 2020-04-05 DIAGNOSIS — F419 Anxiety disorder, unspecified: Secondary | ICD-10-CM

## 2020-04-05 DIAGNOSIS — R404 Transient alteration of awareness: Secondary | ICD-10-CM | POA: Diagnosis not present

## 2020-04-05 DIAGNOSIS — T50904A Poisoning by unspecified drugs, medicaments and biological substances, undetermined, initial encounter: Secondary | ICD-10-CM | POA: Diagnosis not present

## 2020-04-05 DIAGNOSIS — I959 Hypotension, unspecified: Secondary | ICD-10-CM | POA: Diagnosis not present

## 2020-04-05 DIAGNOSIS — L98499 Non-pressure chronic ulcer of skin of other sites with unspecified severity: Secondary | ICD-10-CM

## 2020-04-05 DIAGNOSIS — F319 Bipolar disorder, unspecified: Secondary | ICD-10-CM | POA: Diagnosis not present

## 2020-04-05 LAB — COMPREHENSIVE METABOLIC PANEL
ALT: 42 U/L (ref 0–44)
AST: 29 U/L (ref 15–41)
Albumin: 2.1 g/dL — ABNORMAL LOW (ref 3.5–5.0)
Alkaline Phosphatase: 38 U/L (ref 38–126)
Anion gap: 7 (ref 5–15)
BUN: 10 mg/dL (ref 6–20)
CO2: 25 mmol/L (ref 22–32)
Calcium: 7.9 mg/dL — ABNORMAL LOW (ref 8.9–10.3)
Chloride: 104 mmol/L (ref 98–111)
Creatinine, Ser: 0.61 mg/dL (ref 0.44–1.00)
GFR, Estimated: >60 mL/min (ref >60)
Glucose, Bld: 82 mg/dL (ref 70–99)
Potassium: 4 mmol/L (ref 3.5–5.1)
Sodium: 136 mmol/L (ref 135–145)
Total Bilirubin: 0.6 mg/dL (ref 0.3–1.2)
Total Protein: 5 g/dL — ABNORMAL LOW (ref 6.5–8.1)

## 2020-04-05 LAB — CBC
HCT: 32.1 % — ABNORMAL LOW (ref 36.0–46.0)
Hemoglobin: 9.7 g/dL — ABNORMAL LOW (ref 12.0–15.0)
MCH: 27.5 pg (ref 26.0–34.0)
MCHC: 30.2 g/dL (ref 30.0–36.0)
MCV: 90.9 fL (ref 80.0–100.0)
Platelets: 322 10*3/uL (ref 150–400)
RBC: 3.53 MIL/uL — ABNORMAL LOW (ref 3.87–5.11)
RDW: 15.9 % — ABNORMAL HIGH (ref 11.5–15.5)
WBC: 15.6 10*3/uL — ABNORMAL HIGH (ref 4.0–10.5)
nRBC: 0 % (ref 0.0–0.2)

## 2020-04-05 LAB — LACTIC ACID, PLASMA
Lactic Acid, Venous: 1.1 mmol/L (ref 0.5–1.9)
Lactic Acid, Venous: 0.6 mmol/L (ref 0.5–1.9)

## 2020-04-05 LAB — CORTISOL: Cortisol, Plasma: 8.7 ug/dL

## 2020-04-05 LAB — PROCALCITONIN: Procalcitonin: 1.64 ng/mL

## 2020-04-05 MED ORDER — MIDODRINE HCL 5 MG PO TABS
5.0000 mg | ORAL_TABLET | Freq: Three times a day (TID) | ORAL | Status: DC
Start: 2020-04-05 — End: 2020-04-06

## 2020-04-05 MED ORDER — HYDROXYZINE HCL 25 MG PO TABS
25.0000 mg | ORAL_TABLET | Freq: Three times a day (TID) | ORAL | Status: DC | PRN
  Administered 2020-04-06: 25 mg via ORAL
  Filled 2020-04-05: qty 1

## 2020-04-05 MED ORDER — ESCITALOPRAM OXALATE 10 MG PO TABS
10.0000 mg | ORAL_TABLET | Freq: Every day | ORAL | Status: DC
  Administered 2020-04-06: 10 mg via ORAL
  Filled 2020-04-05: qty 1

## 2020-04-05 NOTE — Progress Notes (Signed)
Held noon time dose of Midodrine as patient's BP is 132/57 (81). After speaking with MD this morning, provider stated it was ok to hold the medication if BP was coming up as this is not patient's normal home medication. Will continue to monitor and assess patient's BP for need of medication for the afternoon dose.

## 2020-04-05 NOTE — Progress Notes (Signed)
PROGRESS NOTE    Patient: [name]                            PCP: Merlyn Albert, MD (Inactive)                    DOB: 06-28-1980            DOA: 04/04/2020 [id number]             DOS: 04/05/2020, 12:21 PM   LOS: 0 days   Date of Service: The patient was seen and examined on 04/05/2020  Subjective:   The patient was seen and examined, remained lethargic, difficulty. Pressure improved but remained soft, continue to improve She responded to IV fluids overnight admitted during Remains afebrile, satting 90% on 3 L of oxygen via nasal cannula  Brief Narrative:   [name]  is a 40 y.o. female, with history of IV drug abuse, nephrolithiasis, depression, anxiety, and more presents the ED with a chief complaint of drug overdose.  Per chart review patient was found in the bathroom of her home unresponsive with IV drug paraphernalia surrounding her.   She was given Narcan by EMS and was transported to the hospital.  She remains somnolent, is experiencing nausea vomiting. Patient opens her eyes to sternal rub, withdraws from pain, mumbles incoherently.  At this time she is on nonrebreather to maintain her oxygen sats.   Chest x-ray showed aspiration pneumonia.  She did slight leukocytosis at 11.2 likely acute phase reactant.   UDS and UA still pending.  Unfortunately no further history can be obtained.   Assessment & Plan:   Active Problems:   Bipolar disorder, unspecified (HCC)   Anxiety and depression   Skin ulcer (HCC)   Bipolar 1 disorder (HCC)   Drug overdose   Hypotensive episode   Acute respiratory failure-aspiration Pneumonia - 1. Demand has improved off supple oxygen via mask, currently on 3 L of oxygen satting 90% 2. Will wean off as as tolerated 3. Remain lethargic 4. WBC elevated now 15.6, 5. Blood pressure is improved 6. 2/2 emesis during overdose 7. Xray shows aspiration 8. Rhonchi BL on lung exam 9. Zosyn was switched to Unasyn by pharmacy 10. Blood  cultures >>>   Acute metabolic encephalopathy 1. Encephalopathic, lethargic 2. Due to possible drug overdose, versus infection 3. Continue respiratory support, blood pressure support 4. 2/2 Heroin overdose 5. UDS: Positive for cocaine, amphetamine, benzodiazepines, marijuana, 6. Continue to monitor  7. Narcan PRN 8. Slowly waking up  Hypotensive   -Blood pressure was soft overnight, continue to improve now  -We will continue with maintenance fluid lactated Ringer's, monitoring blood pressure closely  -Post aggressive IV fluid resuscitation with lactated Ringer's, continue maintenance IV fluids  -Midodrine 10 mg p.o. 3 times daily was ordered, with parameters to hold if blood pressure  improves   Drug overdose 1. Multiple recreation drugs, but still unclear, urine drug screen positive for cocaine, amphetamine, benzodiazepine, marijuana 2. Patient states that she thought she took Tylenol but she is not sure 3. May need psych assessment 4. Continue to monitor  Abscesses  2 abscesses, one is located on the left neck, the other is located in the middle of her abdomen 1. MRSA swab, for possible de-escalation of antibiotics 2. Vancomycin and Zosyn initiated upon admission.. Switched to IV Unasyn 3. Topical bacitracin    History of depression -Home medication of Wellbutrin, Atarax,  Lexapro, trazodone.. Were on hold, will resume slowly -We will discontinue trazodone, switch Atarax to as needed,  Tobacco abuse  Patient counseled regarding smoking cessation  NicoDerm patch offered    ----------------------------------------------------------------------------------------------------------------------------------------- Cultures; Blood Cultures x 2 >> NGT   Antimicrobials: IV vancomycin/Zosyn 04/04/2020. IV Unasyn  >>>    Consultants:  None   -----------------------------------------------------------------------------------------------------------------------------------------  DVT prophylaxis:  SCD/Compression stockings and Heparin SQ Code Status:   Code Status: Full Code  Family Communication: No family member present at bedside - attempt will be made to update daily  The above findings and plan of care has been discussed with patient   in detail,  they expressed understanding and agreement of above. -Advance care planning has been discussed.   Admission status:   Status is: Observation  The patient remains OBS appropriate and will d/c before 2 midnights.  Dispo: The patient is from: Home              Anticipated d/c is to: Home              Patient currently is not medically stable to d/c.   Difficult to place patient No      Level of care: Stepdown   Procedures:   No admission procedures for hospital encounter.     Antimicrobials:  Anti-infectives (From admission, onward)   Start     Dose/Rate Route Frequency Ordered Stop   04/05/20 0400  vancomycin (VANCOREADY) IVPB 1500 mg/300 mL        1,500 mg 150 mL/hr over 120 Minutes Intravenous Every 24 hours 04/04/20 0434     04/04/20 1230  Ampicillin-Sulbactam (UNASYN) 3 g in sodium chloride 0.9 % 100 mL IVPB        3 g 200 mL/hr over 30 Minutes Intravenous Every 6 hours 04/04/20 1215     04/04/20 1200  piperacillin-tazobactam (ZOSYN) IVPB 3.375 g  Status:  Discontinued        3.375 g 12.5 mL/hr over 240 Minutes Intravenous Every 8 hours 04/04/20 0423 04/04/20 1215   04/04/20 0600  piperacillin-tazobactam (ZOSYN) IVPB 3.375 g  Status:  Discontinued        3.375 g 100 mL/hr over 30 Minutes Intravenous Every 8 hours 04/04/20 0415 04/04/20 0421   04/04/20 0430  piperacillin-tazobactam (ZOSYN) IVPB 3.375 g        3.375 g 100 mL/hr over 30 Minutes Intravenous  Once 04/04/20 0423 04/04/20 0534   04/04/20 0430  vancomycin (VANCOREADY) IVPB 1500  mg/300 mL        1,500 mg 150 mL/hr over 120 Minutes Intravenous  Once 04/04/20 0426 04/04/20 0751   04/04/20 0345  levofloxacin (LEVAQUIN) IVPB 750 mg  Status:  Discontinued        750 mg 100 mL/hr over 90 Minutes Intravenous  Once 04/04/20 0339 04/04/20 0415       Medication:  . buPROPion  150 mg Oral Daily  . Chlorhexidine Gluconate Cloth  6 each Topical Q0600  . escitalopram  30 mg Oral Daily  . feeding supplement  237 mL Oral BID BM  . folic acid  1 mg Oral Daily  . heparin  5,000 Units Subcutaneous Q8H  . midodrine  5 mg Oral TID WC  . multivitamin with minerals  1 tablet Oral Daily  . mupirocin cream   Topical TID  . mupirocin ointment  1 application Nasal BID  . nicotine  14 mg Transdermal Daily  . thiamine  100 mg Oral Daily   Or  .  thiamine  100 mg Intravenous Daily    acetaminophen **OR** acetaminophen, haloperidol lactate, hydrOXYzine, ipratropium-albuterol, naLOXone (NARCAN)  injection, ondansetron **OR** ondansetron (ZOFRAN) IV   Objective:   Vitals:   04/05/20 1114 04/05/20 1130 04/05/20 1133 04/05/20 1216  BP:  (!) 132/57  (!) 146/63  Pulse:  80 80   Resp:  (!) 25 (!) 23   Temp: 99 F (37.2 C)   99 F (37.2 C)  TempSrc: Oral   Oral  SpO2:  (!) 88% 90%   Weight:      Height:        Intake/Output Summary (Last 24 hours) at 04/05/2020 1221 Last data filed at 04/05/2020 0900 Gross per 24 hour  Intake 5351.71 ml  Output 2075 ml  Net 3276.71 ml   Filed Weights   04/04/20 0020 04/04/20 0651 04/05/20 0505  Weight: 72.6 kg 78.9 kg 82.4 kg     Examination:     Physical Exam:   General:   Patient remained lethargic-able to follow some commands  HEENT:  Normocephalic, PERRL, otherwise with in Normal limits   Neuro:   Limited exam-CNII-XII intact. , normal motor and sensation, reflexes intact   Lungs:   Clear to auscultation BL, Respirations unlabored, no wheezes / crackles  Cardio:    S1/S2, RRR, No murmure, No Rubs or Gallops   Abdomen:   Soft,  non-tender, bowel sounds active all four quadrants,  no guarding or peritoneal signs.  Muscular skeletal:  Limited exam - in bed, able to move all 4 extremities, Normal strength,  2+ pulses,  symmetric, No pitting edema  Skin:  Dry, warm to touch, negative for any Rashes, Visible neck, abdomen open ulcer/abscess, dressing in place positive for erythema around the ulcers.. Scabbing ulcer left wrist area and snuffbox area  Wounds: Please see nursing documentation            ------------------------------------------------------------------------------------------------------------------------------------------    LABs:  CBC Latest Ref Rng & Units 04/05/2020 04/04/2020 04/04/2020  WBC 4.0 - 10.5 K/uL 15.6(H) 7.2 11.2(H)  Hemoglobin 12.0 - 15.0 g/dL 1.6(X9.7(L) 09.614.5 04.512.2  Hematocrit 36.0 - 46.0 % 32.1(L) 49.8(H) 40.5  Platelets 150 - 400 K/uL 322 391 451(H)   CMP Latest Ref Rng & Units 04/05/2020 04/04/2020 04/04/2020  Glucose 70 - 99 mg/dL 82 83 409(W121(H)  BUN 6 - 20 mg/dL 10 14 15   Creatinine 0.44 - 1.00 mg/dL 1.190.61 1.470.91 8.290.84  Sodium 135 - 145 mmol/L 136 139 139  Potassium 3.5 - 5.1 mmol/L 4.0 4.4 3.7  Chloride 98 - 111 mmol/L 104 107 109  CO2 22 - 32 mmol/L 25 25 24   Calcium 8.9 - 10.3 mg/dL 7.9(L) 8.3(L) 8.0(L)  Total Protein 6.5 - 8.1 g/dL 5.0(L) 6.4(L) 6.6  Total Bilirubin 0.3 - 1.2 mg/dL 0.6 0.6 5.6(O0.1(L)  Alkaline Phos 38 - 126 U/L 38 56 58  AST 15 - 41 U/L 29 34 36  ALT 0 - 44 U/L 42 66(H) 68(H)       Micro Results Recent Results (from the past 240 hour(s))  Resp Panel by RT-PCR (Flu A&B, Covid) Nasopharyngeal Swab     Status: None   Collection Time: 04/04/20  4:16 AM   Specimen: Nasopharyngeal Swab; Nasopharyngeal(NP) swabs in vial transport medium  Result Value Ref Range Status   SARS Coronavirus 2 by RT PCR NEGATIVE NEGATIVE Final    Comment: (NOTE) SARS-CoV-2 target nucleic acids are NOT DETECTED.  The SARS-CoV-2 RNA is generally detectable in upper respiratory specimens during  the acute  phase of infection. The lowest concentration of SARS-CoV-2 viral copies this assay can detect is 138 copies/mL. A negative result does not preclude SARS-Cov-2 infection and should not be used as the sole basis for treatment or other patient management decisions. A negative result may occur with  improper specimen collection/handling, submission of specimen other than nasopharyngeal swab, presence of viral mutation(s) within the areas targeted by this assay, and inadequate number of viral copies(<138 copies/mL). A negative result must be combined with clinical observations, patient history, and epidemiological information. The expected result is Negative.  Fact Sheet for Patients:  BloggerCourse.com  Fact Sheet for Healthcare Providers:  SeriousBroker.it  This test is no t yet approved or cleared by the [place] FDA and  has been authorized for detection and/or diagnosis of SARS-CoV-2 by FDA under an Emergency Use Authorization (EUA). This EUA will remain  in effect (meaning this test can be used) for the duration of the COVID-19 declaration under Section 564(b)(1) of the Act, 21 U.S.C.section 360bbb-3(b)(1), unless the authorization is terminated  or revoked sooner.       Influenza A by PCR NEGATIVE NEGATIVE Final   Influenza B by PCR NEGATIVE NEGATIVE Final    Comment: (NOTE) The Xpert Xpress SARS-CoV-2/FLU/RSV plus assay is intended as an aid in the diagnosis of influenza from Nasopharyngeal swab specimens and should not be used as a sole basis for treatment. Nasal washings and aspirates are unacceptable for Xpert Xpress SARS-CoV-2/FLU/RSV testing.  Fact Sheet for Patients: BloggerCourse.com  Fact Sheet for Healthcare Providers: SeriousBroker.it  This test is not yet approved or cleared by the [place] FDA and has been authorized for detection and/or  diagnosis of SARS-CoV-2 by FDA under an Emergency Use Authorization (EUA). This EUA will remain in effect (meaning this test can be used) for the duration of the COVID-19 declaration under Section 564(b)(1) of the Act, 21 U.S.C. section 360bbb-3(b)(1), unless the authorization is terminated or revoked.  Performed at [facility], [address] [place], [place] [address]   MRSA PCR Screening     Status: Abnormal   Collection Time: 04/04/20  6:27 AM   Specimen: Nasal Mucosa; Nasopharyngeal  Result Value Ref Range Status   MRSA by PCR POSITIVE (A) NEGATIVE Final    Comment:        The GeneXpert MRSA Assay (FDA approved for NASAL specimens only), is one component of a comprehensive MRSA colonization surveillance program. It is not intended to diagnose MRSA infection nor to guide or monitor treatment for MRSA infections. RESULT CALLED TO, READ BACK BY AND VERIFIED WITH: LUKE LEDWELL 4/1 @ 0908 BY Chauncey Mann Performed at [facility], [address], [place], [place] [address]     Radiology Reports DG Chest [place] 1 View  Result Date: 04/04/2020 CLINICAL DATA:  Hypoxia and recent overdose EXAM: PORTABLE CHEST 1 VIEW COMPARISON:  11/20/2019 FINDINGS: Cardiac shadow is within normal limits. The lungs are well aerated bilaterally. Patchy airspace opacities are noted throughout both lungs but primarily within the bases right greater than left. These changes likely represent pulmonary edema following the known overdose. Possibility of aspiration would deserve consideration as well. No bony abnormality is noted. IMPRESSION: Vascular congestion and diffuse airspace opacities as described. Electronically Signed   By: Alcide Clever M.D.   On: 04/04/2020 03:14    SIGNED: Kendell Bane, MD, FHM. [facility],  Pager (please use [email or web address] to page/text) Please use Epic Secure Chat for non-urgent communication (7AM-7PM)  If 7PM-7AM, please contact night-coverage  [email or web address], 04/05/2020,  12:21 PM

## 2020-04-06 DIAGNOSIS — Z8249 Family history of ischemic heart disease and other diseases of the circulatory system: Secondary | ICD-10-CM | POA: Diagnosis not present

## 2020-04-06 DIAGNOSIS — F1910 Other psychoactive substance abuse, uncomplicated: Secondary | ICD-10-CM | POA: Diagnosis present

## 2020-04-06 DIAGNOSIS — F32A Depression, unspecified: Secondary | ICD-10-CM | POA: Diagnosis not present

## 2020-04-06 DIAGNOSIS — F17210 Nicotine dependence, cigarettes, uncomplicated: Secondary | ICD-10-CM | POA: Diagnosis present

## 2020-04-06 DIAGNOSIS — R4182 Altered mental status, unspecified: Secondary | ICD-10-CM | POA: Diagnosis present

## 2020-04-06 DIAGNOSIS — L98499 Non-pressure chronic ulcer of skin of other sites with unspecified severity: Secondary | ICD-10-CM | POA: Diagnosis not present

## 2020-04-06 DIAGNOSIS — I959 Hypotension, unspecified: Secondary | ICD-10-CM | POA: Diagnosis not present

## 2020-04-06 DIAGNOSIS — Z79899 Other long term (current) drug therapy: Secondary | ICD-10-CM | POA: Diagnosis not present

## 2020-04-06 DIAGNOSIS — F319 Bipolar disorder, unspecified: Secondary | ICD-10-CM | POA: Diagnosis not present

## 2020-04-06 DIAGNOSIS — T401X1A Poisoning by heroin, accidental (unintentional), initial encounter: Secondary | ICD-10-CM | POA: Diagnosis not present

## 2020-04-06 DIAGNOSIS — F3160 Bipolar disorder, current episode mixed, unspecified: Secondary | ICD-10-CM | POA: Diagnosis not present

## 2020-04-06 DIAGNOSIS — K219 Gastro-esophageal reflux disease without esophagitis: Secondary | ICD-10-CM | POA: Diagnosis present

## 2020-04-06 DIAGNOSIS — Z885 Allergy status to narcotic agent status: Secondary | ICD-10-CM | POA: Diagnosis not present

## 2020-04-06 DIAGNOSIS — T50904A Poisoning by unspecified drugs, medicaments and biological substances, undetermined, initial encounter: Secondary | ICD-10-CM | POA: Diagnosis not present

## 2020-04-06 DIAGNOSIS — R404 Transient alteration of awareness: Secondary | ICD-10-CM | POA: Diagnosis not present

## 2020-04-06 DIAGNOSIS — G928 Other toxic encephalopathy: Secondary | ICD-10-CM | POA: Diagnosis present

## 2020-04-06 DIAGNOSIS — T50901A Poisoning by unspecified drugs, medicaments and biological substances, accidental (unintentional), initial encounter: Secondary | ICD-10-CM | POA: Diagnosis present

## 2020-04-06 DIAGNOSIS — Z888 Allergy status to other drugs, medicaments and biological substances status: Secondary | ICD-10-CM | POA: Diagnosis not present

## 2020-04-06 DIAGNOSIS — Z823 Family history of stroke: Secondary | ICD-10-CM | POA: Diagnosis not present

## 2020-04-06 DIAGNOSIS — G4700 Insomnia, unspecified: Secondary | ICD-10-CM | POA: Diagnosis present

## 2020-04-06 DIAGNOSIS — F419 Anxiety disorder, unspecified: Secondary | ICD-10-CM | POA: Diagnosis not present

## 2020-04-06 DIAGNOSIS — Z9151 Personal history of suicidal behavior: Secondary | ICD-10-CM | POA: Diagnosis not present

## 2020-04-06 DIAGNOSIS — L0211 Cutaneous abscess of neck: Secondary | ICD-10-CM | POA: Diagnosis present

## 2020-04-06 DIAGNOSIS — L02211 Cutaneous abscess of abdominal wall: Secondary | ICD-10-CM | POA: Diagnosis present

## 2020-04-06 DIAGNOSIS — J302 Other seasonal allergic rhinitis: Secondary | ICD-10-CM | POA: Diagnosis present

## 2020-04-06 DIAGNOSIS — Y92002 Bathroom of unspecified non-institutional (private) residence single-family (private) house as the place of occurrence of the external cause: Secondary | ICD-10-CM | POA: Diagnosis not present

## 2020-04-06 DIAGNOSIS — J9601 Acute respiratory failure with hypoxia: Secondary | ICD-10-CM | POA: Diagnosis present

## 2020-04-06 DIAGNOSIS — J690 Pneumonitis due to inhalation of food and vomit: Secondary | ICD-10-CM | POA: Diagnosis present

## 2020-04-06 DIAGNOSIS — Z880 Allergy status to penicillin: Secondary | ICD-10-CM | POA: Diagnosis not present

## 2020-04-06 DIAGNOSIS — Z20822 Contact with and (suspected) exposure to covid-19: Secondary | ICD-10-CM | POA: Diagnosis present

## 2020-04-06 LAB — CBC
HCT: 32.3 % — ABNORMAL LOW (ref 36.0–46.0)
Hemoglobin: 9.7 g/dL — ABNORMAL LOW (ref 12.0–15.0)
MCH: 27.2 pg (ref 26.0–34.0)
MCHC: 30 g/dL (ref 30.0–36.0)
MCV: 90.5 fL (ref 80.0–100.0)
Platelets: 351 10*3/uL (ref 150–400)
RBC: 3.57 MIL/uL — ABNORMAL LOW (ref 3.87–5.11)
RDW: 15.9 % — ABNORMAL HIGH (ref 11.5–15.5)
WBC: 12.6 10*3/uL — ABNORMAL HIGH (ref 4.0–10.5)
nRBC: 0 % (ref 0.0–0.2)

## 2020-04-06 LAB — COMPREHENSIVE METABOLIC PANEL
ALT: 44 U/L (ref 0–44)
AST: 31 U/L (ref 15–41)
Albumin: 2.3 g/dL — ABNORMAL LOW (ref 3.5–5.0)
Alkaline Phosphatase: 38 U/L (ref 38–126)
Anion gap: 9 (ref 5–15)
BUN: 7 mg/dL (ref 6–20)
CO2: 25 mmol/L (ref 22–32)
Calcium: 7.8 mg/dL — ABNORMAL LOW (ref 8.9–10.3)
Chloride: 102 mmol/L (ref 98–111)
Creatinine, Ser: 0.59 mg/dL (ref 0.44–1.00)
GFR, Estimated: >60 mL/min (ref >60)
Glucose, Bld: 79 mg/dL (ref 70–99)
Potassium: 3.7 mmol/L (ref 3.5–5.1)
Sodium: 136 mmol/L (ref 135–145)
Total Bilirubin: 0.7 mg/dL (ref 0.3–1.2)
Total Protein: 5.5 g/dL — ABNORMAL LOW (ref 6.5–8.1)

## 2020-04-06 MED ORDER — ENSURE ENLIVE PO LIQD
237.0000 mL | Freq: Three times a day (TID) | ORAL | Status: DC
  Administered 2020-04-07 – 2020-04-08 (×3): 237 mL via ORAL

## 2020-04-06 NOTE — Progress Notes (Signed)
PROGRESS NOTE    Patient: [name]                            PCP: Merlyn Albert, MD (Inactive)                    DOB: [birth year]            DOA: 04/04/2020 OZH:[id number]             DOS: 04/06/2020, 10:36 AM   LOS: 0 days   Date of Service: The patient was seen and examined on 04/06/2020  Subjective:   The patient was seen and examined this morning still lethargic but arousable.  Blood pressure much improved.... Nursing staff tolerated p.o. this morning. O2 demand improved from 3 L to 1 L, satting 97% now  Brief Narrative:   [name]  is a 40 y.o. female, with history of IV drug abuse, nephrolithiasis, depression, anxiety, and more presents the ED with a chief complaint of drug overdose.  Per chart review patient was found in the bathroom of her home unresponsive with IV drug paraphernalia surrounding her.   She was given Narcan by EMS and was transported to the hospital.  She remains somnolent, is experiencing nausea vomiting. Patient opens her eyes to sternal rub, withdraws from pain, mumbles incoherently.  At this time she is on nonrebreather to maintain her oxygen sats.   Chest x-ray showed aspiration pneumonia.  She did slight leukocytosis at 11.2 likely acute phase reactant.   UDS and UA still pending.  Unfortunately no further history can be obtained.   Assessment & Plan:   Active Problems:   Bipolar disorder, unspecified (HCC)   Anxiety and depression   Skin ulcer (HCC)   Bipolar 1 disorder (HCC)   Drug overdose   Hypotensive episode   Altered mental status   Acute respiratory failure-aspiration Pneumonia - 1. O2 demand has improved from 3 L down to 1 L, satting 97% 2. Will wean off as as tolerated 3. Remain lethargic 4. WBC elevated now 15.6, 5. Blood pressure is improved 6. 2/2 emesis during overdose 7. Xray shows aspiration 8. Rhonchi BL on lung exam 9. Zosyn was switched to Unasyn by pharmacy 10. Blood cultures >>>   Acute metabolic  encephalopathy 1. Main lethargic, but arousable 2. Due to possible drug overdose, versus infection 3. Continue respiratory support, blood pressure support 4. 2/2 Heroin overdose 5. UDS: Positive for cocaine, amphetamine, benzodiazepines, marijuana, 6. Continue to monitor  7. Narcan PRN 8. Slowly waking up  Hypotensive   -Continue to improve,  -We will continue with maintenance fluid lactated Ringer's, monitoring blood pressure closely  -Post aggressive IV fluid resuscitation with lactated Ringer's, continue maintenance IV fluids  -We will discontinue midodrine   Drug overdose 1. Multiple recreation drugs, but still unclear, urine drug screen positive for cocaine, amphetamine, benzodiazepine, marijuana 2. Patient states that she thought she took Tylenol but she is not sure 3. May need psych assessment 4. Continue to monitor 5. Discussed with social worker regarding providing assist for rehab as an outpatient.  Abscesses  2 abscesses, one is located on the left neck, the other is located in the middle of her abdomen 1. MRSA swab, for possible de-escalation of antibiotics 2. Vancomycin and Zosyn initiated upon admission.. Switched to IV Unasyn 3. Topical bacitracin    History of depression -Home medication of Wellbutrin, Atarax, Lexapro, trazodone.. Were on hold,  will resume slowly -We will discontinue trazodone, switch Atarax to as needed, -Remains lethargic--adjusting medication accordingly  Tobacco abuse  Patient counseled regarding smoking cessation  NicoDerm patch offered    ----------------------------------------------------------------------------------------------------------------------------------------- Cultures; Blood Cultures x 2 >> NGT   Antimicrobials: IV vancomycin/Zosyn 04/04/2020. IV Unasyn  >>>    Consultants:  None   -----------------------------------------------------------------------------------------------------------------------------------------  DVT prophylaxis:  SCD/Compression stockings and Heparin SQ Code Status:   Code Status: Full Code  Family Communication: No family member present at bedside - attempt will be made to update daily  The above findings and plan of care has been discussed with patient   in detail,  they expressed understanding and agreement of above. -Advance care planning has been discussed.   Admission status:   Status is: Inpatient  Patient meets inpatient criteria as continued to be encephalopathic, hypotensive, hypoxic requiring oxygen supplement, IV fluid resuscitation, neurochecks...  Dispo: The patient is from: Home              Anticipated d/c is to: Home in 2-3 days              Patient currently is not medically stable to d/c.   Difficult to place patient No      Level of care: Med-Surg   Procedures:   No admission procedures for hospital encounter.     Antimicrobials:  Anti-infectives (From admission, onward)   Start     Dose/Rate Route Frequency Ordered Stop   04/05/20 0400  vancomycin (VANCOREADY) IVPB 1500 mg/300 mL        1,500 mg 150 mL/hr over 120 Minutes Intravenous Every 24 hours 04/04/20 0434     04/04/20 1230  Ampicillin-Sulbactam (UNASYN) 3 g in sodium chloride 0.9 % 100 mL IVPB        3 g 200 mL/hr over 30 Minutes Intravenous Every 6 hours 04/04/20 1215     04/04/20 1200  piperacillin-tazobactam (ZOSYN) IVPB 3.375 g  Status:  Discontinued        3.375 g 12.5 mL/hr over 240 Minutes Intravenous Every 8 hours 04/04/20 0423 04/04/20 1215   04/04/20 0600  piperacillin-tazobactam (ZOSYN) IVPB 3.375 g  Status:  Discontinued        3.375 g 100 mL/hr over 30 Minutes Intravenous Every 8 hours 04/04/20 0415 04/04/20 0421   04/04/20 0430  piperacillin-tazobactam (ZOSYN) IVPB 3.375 g        3.375 g 100 mL/hr over 30 Minutes  Intravenous  Once 04/04/20 0423 04/04/20 0534   04/04/20 0430  vancomycin (VANCOREADY) IVPB 1500 mg/300 mL        1,500 mg 150 mL/hr over 120 Minutes Intravenous  Once 04/04/20 0426 04/04/20 0751   04/04/20 0345  levofloxacin (LEVAQUIN) IVPB 750 mg  Status:  Discontinued        750 mg 100 mL/hr over 90 Minutes Intravenous  Once 04/04/20 0339 04/04/20 0415       Medication:  . buPROPion  150 mg Oral Daily  . Chlorhexidine Gluconate Cloth  6 each Topical Q0600  . escitalopram  10 mg Oral Daily  . feeding supplement  237 mL Oral TID BM  . folic acid  1 mg Oral Daily  . heparin  5,000 Units Subcutaneous Q8H  . multivitamin with minerals  1 tablet Oral Daily  . mupirocin cream   Topical TID  . mupirocin ointment  1 application Nasal BID  . nicotine  14 mg Transdermal Daily  . thiamine  100 mg Oral Daily   Or  .  thiamine  100 mg Intravenous Daily    acetaminophen **OR** acetaminophen, haloperidol lactate, hydrOXYzine, ipratropium-albuterol, naLOXone (NARCAN)  injection, ondansetron **OR** ondansetron (ZOFRAN) IV   Objective:   Vitals:   04/06/20 0800 04/06/20 0825 04/06/20 0900 04/06/20 1000  BP: 137/89  (!) 164/86   Pulse: 72  92 77  Resp: 18  17 17   Temp:  98.1 F (36.7 C)    TempSrc:  Oral    SpO2: 98%  (!) 89% 97%  Weight:      Height:        Intake/Output Summary (Last 24 hours) at 04/06/2020 1036 Last data filed at 04/06/2020 0800 Gross per 24 hour  Intake 1191.3 ml  Output 4475 ml  Net -3283.7 ml   Filed Weights   04/04/20 0651 04/05/20 0505 04/06/20 0356  Weight: 78.9 kg 82.4 kg 81.2 kg     Examination:      Physical Exam:   General:   Remains lethargic, but arousable  HEENT:  Normocephalic, PERRL, otherwise with in Normal limits   Neuro:  CNII-XII intact. , normal motor and sensation, reflexes intact   Lungs:   Clear to auscultation BL, Respirations unlabored, no wheezes / crackles  Cardio:    S1/S2, RRR, No murmure, No Rubs or Gallops   Abdomen:    Soft, non-tender, bowel sounds active all four quadrants,  no guarding or peritoneal signs.  Muscular skeletal:  Limited exam - in bed, able to move all 4 extremities, Normal strength,  2+ pulses,  symmetric, No pitting edema  Skin:  Dry, warm to touch, negative for any Rashes, Neck/abdominal ulcer dressing in place  Wounds: Please see nursing documentation            ------------------------------------------------------------------------------------------------------------------------------------------    LABs:  CBC Latest Ref Rng & Units 04/06/2020 04/05/2020 04/04/2020  WBC 4.0 - 10.5 K/uL 12.6(H) 15.6(H) 7.2  Hemoglobin 12.0 - 15.0 g/dL 06/04/2020) 0.2(O) 3.7(C  Hematocrit 36.0 - 46.0 % 32.3(L) 32.1(L) 49.8(H)  Platelets 150 - 400 K/uL 351 322 391   CMP Latest Ref Rng & Units 04/06/2020 04/05/2020 04/04/2020  Glucose 70 - 99 mg/dL 79 82 83  BUN 6 - 20 mg/dL 7 10 14   Creatinine 0.44 - 1.00 mg/dL 06/04/2020 5.02  Sodium 135 - 145 mmol/L 136 136 139  Potassium 3.5 - 5.1 mmol/L 3.7 4.0 4.4  Chloride 98 - 111 mmol/L 102 104 107  CO2 22 - 32 mmol/L 25 25 25   Calcium 8.9 - 10.3 mg/dL 7.8(L) 7.9(L) 8.3(L)  Total Protein 6.5 - 8.1 g/dL 7.74) 1.28) 6.4(L)  Total Bilirubin 0.3 - 1.2 mg/dL 0.7 0.6 0.6  Alkaline Phos 38 - 126 U/L 38 38 56  AST 15 - 41 U/L 31 29 34  ALT 0 - 44 U/L 44 42 66(H)       Micro Results Recent Results (from the past 240 hour(s))  Resp Panel by RT-PCR (Flu A&B, Covid) Nasopharyngeal Swab     Status: None   Collection Time: 04/04/20  4:16 AM   Specimen: Nasopharyngeal Swab; Nasopharyngeal(NP) swabs in vial transport medium  Result Value Ref Range Status   SARS Coronavirus 2 by RT PCR NEGATIVE NEGATIVE Final    Comment: (NOTE) SARS-CoV-2 target nucleic acids are NOT DETECTED.  The SARS-CoV-2 RNA is generally detectable in upper respiratory specimens during the acute phase of infection. The lowest concentration of SARS-CoV-2 viral copies this assay can detect is 138  copies/mL. A negative result does not preclude SARS-Cov-2 infection and should  not be used as the sole basis for treatment or other patient management decisions. A negative result may occur with  improper specimen collection/handling, submission of specimen other than nasopharyngeal swab, presence of viral mutation(s) within the areas targeted by this assay, and inadequate number of viral copies(<138 copies/mL). A negative result must be combined with clinical observations, patient history, and epidemiological information. The expected result is Negative.  Fact Sheet for Patients:  BloggerCourse.com  Fact Sheet for Healthcare Providers:  SeriousBroker.it  This test is no t yet approved or cleared by the [place] FDA and  has been authorized for detection and/or diagnosis of SARS-CoV-2 by FDA under an Emergency Use Authorization (EUA). This EUA will remain  in effect (meaning this test can be used) for the duration of the COVID-19 declaration under Section 564(b)(1) of the Act, 21 U.S.C.section 360bbb-3(b)(1), unless the authorization is terminated  or revoked sooner.       Influenza A by PCR NEGATIVE NEGATIVE Final   Influenza B by PCR NEGATIVE NEGATIVE Final    Comment: (NOTE) The Xpert Xpress SARS-CoV-2/FLU/RSV plus assay is intended as an aid in the diagnosis of influenza from Nasopharyngeal swab specimens and should not be used as a sole basis for treatment. Nasal washings and aspirates are unacceptable for Xpert Xpress SARS-CoV-2/FLU/RSV testing.  Fact Sheet for Patients: BloggerCourse.com  Fact Sheet for Healthcare Providers: SeriousBroker.it  This test is not yet approved or cleared by the [place] FDA and has been authorized for detection and/or diagnosis of SARS-CoV-2 by FDA under an Emergency Use Authorization (EUA). This EUA will remain in effect (meaning  this test can be used) for the duration of the COVID-19 declaration under Section 564(b)(1) of the Act, 21 U.S.C. section 360bbb-3(b)(1), unless the authorization is terminated or revoked.  Performed at [facility], [address] [place], [place] [address]   MRSA PCR Screening     Status: Abnormal   Collection Time: 04/04/20  6:27 AM   Specimen: Nasal Mucosa; Nasopharyngeal  Result Value Ref Range Status   MRSA by PCR POSITIVE (A) NEGATIVE Final    Comment:        The GeneXpert MRSA Assay (FDA approved for NASAL specimens only), is one component of a comprehensive MRSA colonization surveillance program. It is not intended to diagnose MRSA infection nor to guide or monitor treatment for MRSA infections. RESULT CALLED TO, READ BACK BY AND VERIFIED WITH: LUKE LEDWELL 4/1 @ 0908 BY S. BEARD Performed at [facility], [address] [place], [place] [address]   Culture, blood (routine x 2)     Status: None (Preliminary result)   Collection Time: 04/05/20  1:36 PM   Specimen: BLOOD RIGHT ARM  Result Value Ref Range Status   Specimen Description BLOOD RIGHT ARM  Final   Special Requests   Final    Blood Culture adequate volume BOTTLES DRAWN AEROBIC AND ANAEROBIC Performed at [facility], [address] [place], [place] [address]    Culture PENDING  Incomplete   Report Status PENDING  Incomplete  Culture, blood (routine x 2)     Status: None (Preliminary result)   Collection Time: 04/05/20  1:36 PM   Specimen: BLOOD RIGHT HAND  Result Value Ref Range Status   Specimen Description BLOOD RIGHT HAND  Final   Special Requests   Final    Blood Culture adequate volume BOTTLES DRAWN AEROBIC AND ANAEROBIC Performed at [facility], [address], [place], [place] [address]    Culture PENDING  Incomplete   Report Status PENDING  Incomplete    Radiology Reports DG Chest Port 1 View  Result Date: 04/04/2020 CLINICAL DATA:  Hypoxia and recent overdose EXAM: PORTABLE CHEST 1 VIEW  COMPARISON:  11/20/2019 FINDINGS: Cardiac shadow is within normal limits. The lungs are well aerated bilaterally. Patchy airspace opacities are noted throughout both lungs but primarily within the bases right greater than left. These changes likely represent pulmonary edema following the known overdose. Possibility of aspiration would deserve consideration as well. No bony abnormality is noted. IMPRESSION: Vascular congestion and diffuse airspace opacities as described. Electronically Signed   By: Alcide Clever M.D.   On: 04/04/2020 03:14    SIGNED: Kendell Bane, MD, FHM. [facility],  Pager (please use [email or web address] to page/text) Please use Epic Secure Chat for non-urgent communication (7AM-7PM)  If 7PM-7AM, please contact night-coverage [email or web address], 04/06/2020, 10:36 AM

## 2020-04-06 NOTE — Progress Notes (Signed)
Initial Nutrition Assessment  DOCUMENTATION CODES:   Not applicable  INTERVENTION:   Ensure Enlive po TID, each supplement provides 350 kcal and 20 grams of protein  Continue MVI with Minerals   NUTRITION DIAGNOSIS:   Inadequate oral intake related to acute illness,lethargy/confusion as evidenced by meal completion < 50%,per patient/family report.  GOAL:   Patient will meet greater than or equal to 90% of their needs  MONITOR:   PO intake,Supplement acceptance,Diet advancement,Labs,Weight trends  REASON FOR ASSESSMENT:   Malnutrition Screening Tool    ASSESSMENT:   40 yo female admitted with acute respiratory failure with aspiration pneumonia, acute metabolic encephalopathy likely due to drug OD, 2 abscesses (one in the neck and one in abdomen) . UDS+ cocaine, amphetamine, benzo and THC. PMH includes  Recorded po intake 25% this AM on [place] diet. More alert today per RN assessment. Unable to reach pt via telephone.   No weight loss per weight encounters. Current wt 81.2 kg. Weight of 72.6 kg in March  Labs: reviewed Meds: MVI with Minerals, thiamine, folic acid   Diet Order:   Diet Order            Diet full liquid Room service appropriate? Yes; Fluid consistency: Thin  Diet effective now                 EDUCATION NEEDS:   Not appropriate for education at this time  Skin:  Skin Assessment: Reviewed RN Assessment  Last BM:  4/2  Height:   Ht Readings from Last 1 Encounters:  04/04/20 5\' 1"  (1.549 m)    Weight:   Wt Readings from Last 1 Encounters:  04/06/20 81.2 kg   BMI:  Body mass index is 33.82 kg/m.  Estimated Nutritional Needs:   Kcal:  1800-2000 kcals  Protein:  90-100 g  Fluid:  >/= 1.8 L    06/06/20 MS, RDN, LDN, CNSC Registered Dietitian III Clinical Nutrition RD Pager and On-Call Pager Number Located in [place]

## 2020-04-06 NOTE — TOC Initial Note (Signed)
Transition of Care [facility]) - Initial/Assessment Note    Patient Details  Name: [name] MRN: [id number] Date of Birth: 1980/01/09  [facility]) CM/SW Contact:    Barry Brunner, LCSW Phone Number: 04/06/2020, 3:10 PM  Clinical Narrative:                 Patient is a 40 year old female admitted for Drug overdose. CSW observed high readmission risk score. CSW conducted readmission risk assessment. CSW also conducted initial assessment. Patient was lethargic with raspy voice during initial assessment and not able to discuss home life in full detail. CSW received an SA consult for patient. CSW provided SA resources to patient and reviewed in patient and out patient facilities. Patient agreeable to review list. TOC to follow.   Expected Discharge Plan: Home/Self Care Barriers to Discharge: Continued Medical Work up   Patient Goals and CMS Choice Patient states their goals for this hospitalization and ongoing recovery are:: Return home CMS Medicare.gov Compare Post Acute Care list provided to:: Patient Choice offered to / list presented to : Patient  Expected Discharge Plan and Services Expected Discharge Plan: Home/Self Care In-house Referral: NA Discharge Planning Services: Other - See comment (SA resources)                     DME Arranged: N/A DME Agency: NA       HH Arranged: NA HH Agency: NA        Prior Living Arrangements/Services   Lives with:: Self Patient language and need for interpreter reviewed:: Yes Do you feel safe going back to the place where you live?: Yes      Need for Family Participation in Patient Care: Yes (Comment) Care giver support system in place?: Yes (comment)   Criminal Activity/Legal Involvement Pertinent to Current Situation/Hospitalization: No - Comment as needed  Activities of Daily Living Home Assistive Devices/Equipment: None ADL Screening (condition at time of admission) Patient's cognitive ability adequate to  safely complete daily activities?: Yes Is the patient deaf or have difficulty hearing?: No Does the patient have difficulty seeing, even when wearing glasses/contacts?: No Does the patient have difficulty concentrating, remembering, or making decisions?: Yes Patient able to express need for assistance with ADLs?: Yes Does the patient have difficulty dressing or bathing?: No Independently performs ADLs?: Yes (appropriate for developmental age) Does the patient have difficulty walking or climbing stairs?: No Weakness of Legs: None Weakness of Arms/Hands: None  Permission Sought/Granted   Permission granted to share information with : Yes, Verbal Permission Granted     Permission granted to share info w AGENCY: Local resources        Emotional Assessment Appearance:: Appears older than stated age   Affect (typically observed): Accepting,Adaptable,Appropriate Orientation: : Oriented to Self,Oriented to Situation,Oriented to Place,Oriented to  Time Alcohol / Substance Use: Illicit Drugs Psych Involvement: No (comment)  Admission diagnosis:  Drug overdose [T50.901A] Accidental overdose of heroin, initial encounter (HCC) [T40.1X1A] Aspiration pneumonia of both lungs, unspecified aspiration pneumonia type, unspecified part of lung (HCC) [J69.0] Altered mental status [R41.82] Patient Active Problem List   Diagnosis Date Noted  . Altered mental status 04/06/2020  . Drug overdose 04/04/2020  . Hypotensive episode 04/04/2020  . Amphetamine use disorder, severe, dependence (HCC) 03/30/2019  . Bipolar 1 disorder (HCC) 03/29/2019  . Suicide attempt (HCC) 03/29/2019  . Skin ulcer (HCC) 08/03/2016  . Cellulitis 08/03/2016  . Cellulitis of hand, left   . Hepatitis C antibody  test positive 01/09/2015  . GERD (gastroesophageal reflux disease) 01/09/2015  . Abnormal liver enzymes 12/10/2014  . Acute hepatitis C virus infection without hepatic coma 12/10/2014  . Insomnia 09/12/2014  .  Thoracic spondylosis 04/03/2014  . Herniated nucleus pulposus, thoracic 03/28/2014  . Anxiety and depression 11/29/2013  . Bipolar disorder, unspecified (HCC) 08/14/2013  . Panic attacks 08/14/2013  . Chronic back pain 08/14/2013   PCP:  Merlyn Albert, MD (Inactive) Pharmacy:   [phone] - [place] [place] [address] [place] [address] Phone: [phone] Fax: [phone]  [facility] [place], [place] [place] [address] Phone: [phone] Fax: [phone]     Social Determinants of Health (SDOH) Interventions    Readmission Risk Interventions Readmission Risk Prevention Plan 04/06/2020  Transportation Screening Complete  Home Care Screening Complete  Medication Review (RN CM) Complete  Some recent data might be hidden

## 2020-04-06 NOTE — TOC Initial Note (Deleted)
Transition of Care [facility]) - Initial/Assessment Note    Patient Details  Name: [name] MRN: [id number] Date of Birth: 1980-07-02  Transition of Care [facility]) CM/SW Contact:    Barry Brunner, LCSW Phone Number: 04/06/2020, 3:06 PM  Clinical Narrative:                 Patient is a 40 year old female admitted for Drug overdose. CSW received an SA consult for patient. CSW provided SA resources to patient and reviewed in patient and out patient facilities. Patient agreeable to review list. TOC to follow.   Expected Discharge Plan: Home/Self Care Barriers to Discharge: Continued Medical Work up   Patient Goals and CMS Choice Patient states their goals for this hospitalization and ongoing recovery are:: Return home CMS Medicare.gov Compare Post Acute Care list provided to:: Patient Choice offered to / list presented to : Patient  Expected Discharge Plan and Services Expected Discharge Plan: Home/Self Care In-house Referral: NA Discharge Planning Services: Other - See comment (SA resources)                     DME Arranged: N/A DME Agency: NA       HH Arranged: NA HH Agency: NA        Prior Living Arrangements/Services   Lives with:: Self Patient language and need for interpreter reviewed:: Yes Do you feel safe going back to the place where you live?: Yes      Need for Family Participation in Patient Care: Yes (Comment) Care giver support system in place?: Yes (comment)   Criminal Activity/Legal Involvement Pertinent to Current Situation/Hospitalization: No - Comment as needed  Activities of Daily Living Home Assistive Devices/Equipment: None ADL Screening (condition at time of admission) Patient's cognitive ability adequate to safely complete daily activities?: Yes Is the patient deaf or have difficulty hearing?: No Does the patient have difficulty seeing, even when wearing glasses/contacts?: No Does the patient have difficulty concentrating, remembering, or  making decisions?: Yes Patient able to express need for assistance with ADLs?: Yes Does the patient have difficulty dressing or bathing?: No Independently performs ADLs?: Yes (appropriate for developmental age) Does the patient have difficulty walking or climbing stairs?: No Weakness of Legs: None Weakness of Arms/Hands: None  Permission Sought/Granted   Permission granted to share information with : Yes, Verbal Permission Granted     Permission granted to share info w AGENCY: Local resources        Emotional Assessment Appearance:: Appears older than stated age   Affect (typically observed): Accepting,Adaptable,Appropriate Orientation: : Oriented to Self,Oriented to Situation,Oriented to Place,Oriented to  Time Alcohol / Substance Use: Illicit Drugs Psych Involvement: No (comment)  Admission diagnosis:  Drug overdose [T50.901A] Accidental overdose of heroin, initial encounter (HCC) [T40.1X1A] Aspiration pneumonia of both lungs, unspecified aspiration pneumonia type, unspecified part of lung (HCC) [J69.0] Altered mental status [R41.82] Patient Active Problem List   Diagnosis Date Noted  . Altered mental status 04/06/2020  . Drug overdose 04/04/2020  . Hypotensive episode 04/04/2020  . Amphetamine use disorder, severe, dependence (HCC) 03/30/2019  . Bipolar 1 disorder (HCC) 03/29/2019  . Suicide attempt (HCC) 03/29/2019  . Skin ulcer (HCC) 08/03/2016  . Cellulitis 08/03/2016  . Cellulitis of hand, left   . Hepatitis C antibody test positive 01/09/2015  . GERD (gastroesophageal reflux disease) 01/09/2015  . Abnormal liver enzymes 12/10/2014  . Acute hepatitis C virus infection without hepatic coma 12/10/2014  . Insomnia 09/12/2014  . Thoracic spondylosis  04/03/2014  . Herniated nucleus pulposus, thoracic 03/28/2014  . Anxiety and depression 11/29/2013  . Bipolar disorder, unspecified (HCC) 08/14/2013  . Panic attacks 08/14/2013  . Chronic back pain 08/14/2013   PCP:   Merlyn Albert, MD (Inactive) Pharmacy:   [phone] - [place], [place] - [address] [place] [address] Phone: [phone] Fax: [phone]  [facility] [place], [place] - [place] [place] [address] Phone: [phone] Fax: [phone]     Social Determinants of Health (SDOH) Interventions    Readmission Risk Interventions No flowsheet data found.

## 2020-04-07 DIAGNOSIS — I959 Hypotension, unspecified: Secondary | ICD-10-CM | POA: Diagnosis not present

## 2020-04-07 DIAGNOSIS — R404 Transient alteration of awareness: Secondary | ICD-10-CM | POA: Diagnosis not present

## 2020-04-07 DIAGNOSIS — T50904A Poisoning by unspecified drugs, medicaments and biological substances, undetermined, initial encounter: Secondary | ICD-10-CM | POA: Diagnosis not present

## 2020-04-07 DIAGNOSIS — F319 Bipolar disorder, unspecified: Secondary | ICD-10-CM | POA: Diagnosis not present

## 2020-04-07 LAB — COMPREHENSIVE METABOLIC PANEL
ALT: 50 U/L — ABNORMAL HIGH (ref 0–44)
AST: 42 U/L — ABNORMAL HIGH (ref 15–41)
Albumin: 2.5 g/dL — ABNORMAL LOW (ref 3.5–5.0)
Alkaline Phosphatase: 48 U/L (ref 38–126)
Anion gap: 11 (ref 5–15)
BUN: 8 mg/dL (ref 6–20)
CO2: 24 mmol/L (ref 22–32)
Calcium: 8.2 mg/dL — ABNORMAL LOW (ref 8.9–10.3)
Chloride: 104 mmol/L (ref 98–111)
Creatinine, Ser: 0.62 mg/dL (ref 0.44–1.00)
GFR, Estimated: >60 mL/min (ref >60)
Glucose, Bld: 85 mg/dL (ref 70–99)
Potassium: 3.7 mmol/L (ref 3.5–5.1)
Sodium: 139 mmol/L (ref 135–145)
Total Bilirubin: 0.7 mg/dL (ref 0.3–1.2)
Total Protein: 6.3 g/dL — ABNORMAL LOW (ref 6.5–8.1)

## 2020-04-07 LAB — CBC
HCT: 34.1 % — ABNORMAL LOW (ref 36.0–46.0)
Hemoglobin: 10.4 g/dL — ABNORMAL LOW (ref 12.0–15.0)
MCH: 26.5 pg (ref 26.0–34.0)
MCHC: 30.5 g/dL (ref 30.0–36.0)
MCV: 87 fL (ref 80.0–100.0)
Platelets: 451 10*3/uL — ABNORMAL HIGH (ref 150–400)
RBC: 3.92 MIL/uL (ref 3.87–5.11)
RDW: 15.2 % (ref 11.5–15.5)
WBC: 8.4 10*3/uL (ref 4.0–10.5)
nRBC: 0 % (ref 0.0–0.2)

## 2020-04-07 MED ORDER — AMOXICILLIN-POT CLAVULANATE 875-125 MG PO TABS
1.0000 | ORAL_TABLET | Freq: Two times a day (BID) | ORAL | Status: DC
  Administered 2020-04-07 – 2020-04-08 (×2): 1 via ORAL
  Filled 2020-04-07 (×2): qty 1

## 2020-04-07 MED ORDER — ESCITALOPRAM OXALATE 10 MG PO TABS
5.0000 mg | ORAL_TABLET | Freq: Every day | ORAL | Status: DC
  Administered 2020-04-07 – 2020-04-08 (×2): 5 mg via ORAL
  Filled 2020-04-07 (×2): qty 1

## 2020-04-07 MED ORDER — HYDROXYZINE HCL 25 MG PO TABS: 25.0000 mg | ORAL_TABLET | Freq: Two times a day (BID) | ORAL | Status: DC | PRN

## 2020-04-07 MED ORDER — BUPROPION HCL ER (SR) 100 MG PO TB12
100.0000 mg | ORAL_TABLET | Freq: Every day | ORAL | Status: DC
Start: 2020-04-07 — End: 2020-04-08
  Administered 2020-04-07 – 2020-04-08 (×2): 100 mg via ORAL
  Filled 2020-04-07 (×2): qty 1

## 2020-04-07 MED ORDER — DOXYCYCLINE HYCLATE 100 MG PO TABS
100.0000 mg | ORAL_TABLET | Freq: Two times a day (BID) | ORAL | Status: DC
  Administered 2020-04-07 – 2020-04-08 (×2): 100 mg via ORAL
  Filled 2020-04-07 (×2): qty 1

## 2020-04-07 NOTE — Progress Notes (Signed)
PROGRESS NOTE    Patient: [name]                            PCP: Merlyn Albert, MD (Inactive)                    DOB: [birth year]            DOA: 04/04/2020 ZOX:[id number]             DOS: 04/07/2020, 12:25 PM   LOS: 1 day   Date of Service: The patient was seen and examined on 04/07/2020  Subjective:   The patient was seen and examined, sleeping arousable much more awake alert following commands Still feeling weak,... Complains of generalized aches and pain, withdrawal--no homicidal suicidal ideation  Brief Narrative:   [name]  is a 40 y.o. female, with history of IV drug abuse, nephrolithiasis, depression, anxiety, and more presents the ED with a chief complaint of drug overdose.  Per chart review patient was found in the bathroom of her home unresponsive with IV drug paraphernalia surrounding her.   She was given Narcan by EMS and was transported to the hospital.  She remains somnolent, is experiencing nausea vomiting. Patient opens her eyes to sternal rub, withdraws from pain, mumbles incoherently.  At this time she is on nonrebreather to maintain her oxygen sats.   Chest x-ray showed aspiration pneumonia.  She did slight leukocytosis at 11.2 likely acute phase reactant.   UDS and UA still pending.  Unfortunately no further history can be obtained.   Assessment & Plan:   Active Problems:   Bipolar disorder, unspecified (HCC)   Anxiety and depression   Skin ulcer (HCC)   Bipolar 1 disorder (HCC)   Drug overdose   Hypotensive episode   Altered mental status   Acute respiratory failure-aspiration Pneumonia - 1. Much improved patient has been successfully weaned off supplemental oxygen 2. Currently satting 93% on room air 3. Improved mental status 4. WBC elevated now 15.6, 5. Blood pressure is improved 6. 2/2 emesis during overdose 7. Xray shows aspiration 8. Rhonchi BL on lung exam 9. Zosyn was switched to Unasyn by pharmacy>>> since cultures negative,  afebrile, improved leukocytosis antibiotics will be switched to p.o. Augmentin today 04/08/2018. 10. Blood cultures >>> remain negative to date  Acute metabolic encephalopathy 1. Improved mental status, back to baseline 2. Due to possible drug overdose, versus infection 3. Continue respiratory support, blood pressure support 4. 2/2 Heroin overdose 5. UDS: Positive for cocaine, amphetamine, benzodiazepines, marijuana, 6. Continue to monitor  7. Narcan PRN 8. Slowly waking up  Hypotensive -hypertensive  -Blood pressure has much improved, now hypertensive  -For past 3 days in ICU setting patient remained hypotensive, requiring multiple liters of IV fluid  lactated Ringer's, midodrine, which all has been DC'd now    Drug overdose 1. Multiple recreation drugs, but still unclear, urine drug screen positive for cocaine, amphetamine, benzodiazepine, marijuana 2. Patient states that she thought she took Tylenol but she is not sure 3. May need psych assessment 4. Continue to monitor 5. Discussed with social worker regarding providing assist for rehab as an outpatient.  Abscesses  2 abscesses, one is located on the left neck, the other is located in the middle of her abdomen 1. MRSA swab, for possible de-escalation of antibiotics 2. Vancomycin and Zosyn initiated upon admission.. Switched to IV Unasyn 3. Topical bacitracin    History of  depression -Home medication of Wellbutrin, Atarax, Lexapro, trazodone..  -We will discontinue trazodone, switch Atarax to as needed, -Home medication with a lower dose  Tobacco abuse  Patient counseled regarding smoking cessation  NicoDerm patch offered    ----------------------------------------------------------------------------------------------------------------------------------------- Cultures; Blood Cultures x 2 >> NGT   Antimicrobials: IV vancomycin/Zosyn 04/04/2020. IV Unasyn  >>> discontinue 04/07/2020 -initiating p.o. Augmentin>>     Consultants: None   -----------------------------------------------------------------------------------------------------------------------------------------  DVT prophylaxis:  SCD/Compression stockings and Heparin SQ Code Status:   Code Status: Full Code  Family Communication: No family member present at bedside - attempt will be made to update daily  The above findings and plan of care has been discussed with patient   in detail,  they expressed understanding and agreement of above. -Advance care planning has been discussed.   Admission status:   Status is: Inpatient  Patient meets inpatient criteria as continued to be encephalopathic, hypotensive, hypoxic requiring oxygen supplement, IV fluid resuscitation, neurochecks...  Dispo: The patient is from: Home              Anticipated d/c is to: Home in 2-3 days              Patient currently is not medically stable to d/c.   Difficult to place patient No      Level of care: Telemetry   Procedures:   No admission procedures for hospital encounter.     Antimicrobials:  Anti-infectives (From admission, onward)   Start     Dose/Rate Route Frequency Ordered Stop   04/07/20 2200  amoxicillin-clavulanate (AUGMENTIN) 875-125 MG per tablet 1 tablet        1 tablet Oral Every 12 hours 04/07/20 1002     04/07/20 1100  doxycycline (VIBRA-TABS) tablet 100 mg        100 mg Oral Every 12 hours 04/07/20 1001     04/05/20 0400  vancomycin (VANCOREADY) IVPB 1500 mg/300 mL  Status:  Discontinued        1,500 mg 150 mL/hr over 120 Minutes Intravenous Every 24 hours 04/04/20 0434 04/07/20 0958   04/04/20 1230  Ampicillin-Sulbactam (UNASYN) 3 g in sodium chloride 0.9 % 100 mL IVPB  Status:  Discontinued        3 g 200 mL/hr over 30 Minutes Intravenous Every 6 hours 04/04/20 1215 04/07/20 1002   04/04/20 1200  piperacillin-tazobactam (ZOSYN) IVPB 3.375 g  Status:  Discontinued        3.375 g 12.5 mL/hr over 240 Minutes  Intravenous Every 8 hours 04/04/20 0423 04/04/20 1215   04/04/20 0600  piperacillin-tazobactam (ZOSYN) IVPB 3.375 g  Status:  Discontinued        3.375 g 100 mL/hr over 30 Minutes Intravenous Every 8 hours 04/04/20 0415 04/04/20 0421   04/04/20 0430  piperacillin-tazobactam (ZOSYN) IVPB 3.375 g        3.375 g 100 mL/hr over 30 Minutes Intravenous  Once 04/04/20 0423 04/04/20 0534   04/04/20 0430  vancomycin (VANCOREADY) IVPB 1500 mg/300 mL        1,500 mg 150 mL/hr over 120 Minutes Intravenous  Once 04/04/20 0426 04/04/20 0751   04/04/20 0345  levofloxacin (LEVAQUIN) IVPB 750 mg  Status:  Discontinued        750 mg 100 mL/hr over 90 Minutes Intravenous  Once 04/04/20 0339 04/04/20 0415       Medication:  . amoxicillin-clavulanate  1 tablet Oral Q12H  . buPROPion  100 mg Oral Daily  . Chlorhexidine Gluconate Cloth  6 each Topical Q0600  . doxycycline  100 mg Oral Q12H  . escitalopram  5 mg Oral Daily  . feeding supplement  237 mL Oral TID BM  . folic acid  1 mg Oral Daily  . heparin  5,000 Units Subcutaneous Q8H  . multivitamin with minerals  1 tablet Oral Daily  . mupirocin cream   Topical TID  . mupirocin ointment  1 application Nasal BID  . nicotine  14 mg Transdermal Daily  . thiamine  100 mg Oral Daily   Or  . thiamine  100 mg Intravenous Daily    acetaminophen **OR** acetaminophen, haloperidol lactate, hydrOXYzine, ipratropium-albuterol, naLOXone (NARCAN)  injection, ondansetron **OR** ondansetron (ZOFRAN) IV   Objective:   Vitals:   04/06/20 2102 04/07/20 0417 04/07/20 0500 04/07/20 0847  BP: (!) 146/87 (!) 160/94  (!) 155/93  Pulse: 86 92  80  Resp: 18 18  18   Temp: (!) 97.4 F (36.3 C) 98.6 F (37 C)  98.6 F (37 C)  TempSrc:    Oral  SpO2: 100% 90%  93%  Weight:   78.4 kg   Height:        Intake/Output Summary (Last 24 hours) at 04/07/2020 1225 Last data filed at 04/07/2020 1035 Gross per 24 hour  Intake 300 ml  Output 2000 ml  Net -1700 ml   Filed  Weights   04/05/20 0505 04/06/20 0356 04/07/20 0500  Weight: 82.4 kg 81.2 kg 78.4 kg     Examination:         Physical Exam:   General:  Alert, oriented, cooperative, much more awake, following command this morning  HEENT:  Normocephalic, PERRL, otherwise with in Normal limits   Neuro:  CNII-XII intact. , normal motor and sensation, reflexes intact   Lungs:   Clear to auscultation BL, Respirations unlabored, no wheezes / crackles  Cardio:    S1/S2, RRR, No murmure, No Rubs or Gallops   Abdomen:   Soft, non-tender, bowel sounds active all four quadrants,  no guarding or peritoneal signs.  Muscular skeletal:  Limited exam - in bed, able to move all 4 extremities, Normal strength,  2+ pulses,  symmetric, No pitting edema  Skin:  Dry, warm to touch, negative for any Rashes, Right wrist, neck/abdominal ulcer, dressing in place--improved erythema, healing well  Wounds: Please see nursing documentation             ------------------------------------------------------------------------------------------------------------------------------------------    LABs:  CBC Latest Ref Rng & Units 04/07/2020 04/06/2020 04/05/2020  WBC 4.0 - 10.5 K/uL 8.4 12.6(H) 15.6(H)  Hemoglobin 12.0 - 15.0 g/dL 10.4(L) 9.7(L) 9.7(L)  Hematocrit 36.0 - 46.0 % 34.1(L) 32.3(L) 32.1(L)  Platelets 150 - 400 K/uL 451(H) 351 322   CMP Latest Ref Rng & Units 04/07/2020 04/06/2020 04/05/2020  Glucose 70 - 99 mg/dL 85 79 82  BUN 6 - 20 mg/dL 8 7 10   Creatinine 0.44 - 1.00 mg/dL 06/05/2020 6.38  Sodium 135 - 145 mmol/L 139 136 136  Potassium 3.5 - 5.1 mmol/L 3.7 3.7 4.0  Chloride 98 - 111 mmol/L 104 102 104  CO2 22 - 32 mmol/L 24 25 25   Calcium 8.9 - 10.3 mg/dL 8.2(L) 7.8(L) 7.9(L)  Total Protein 6.5 - 8.1 g/dL 6.3(L) 5.5(L) 5.0(L)  Total Bilirubin 0.3 - 1.2 mg/dL 0.7 0.7 0.6  Alkaline Phos 38 - 126 U/L 48 38 38  AST 15 - 41 U/L 42(H) 31 29  ALT 0 - 44 U/L 50(H) 44 42  Micro Results Recent Results  (from the past 240 hour(s))  Resp Panel by RT-PCR (Flu A&B, Covid) Nasopharyngeal Swab     Status: None   Collection Time: 04/04/20  4:16 AM   Specimen: Nasopharyngeal Swab; Nasopharyngeal(NP) swabs in vial transport medium  Result Value Ref Range Status   SARS Coronavirus 2 by RT PCR NEGATIVE NEGATIVE Final    Comment: (NOTE) SARS-CoV-2 target nucleic acids are NOT DETECTED.  The SARS-CoV-2 RNA is generally detectable in upper respiratory specimens during the acute phase of infection. The lowest concentration of SARS-CoV-2 viral copies this assay can detect is 138 copies/mL. A negative result does not preclude SARS-Cov-2 infection and should not be used as the sole basis for treatment or other patient management decisions. A negative result may occur with  improper specimen collection/handling, submission of specimen other than nasopharyngeal swab, presence of viral mutation(s) within the areas targeted by this assay, and inadequate number of viral copies(<138 copies/mL). A negative result must be combined with clinical observations, patient history, and epidemiological information. The expected result is Negative.  Fact Sheet for Patients:  BloggerCourse.comhttps://www.fda.gov/media/152166/download  Fact Sheet for Healthcare Providers:  SeriousBroker.ithttps://www.fda.gov/media/152162/download  This test is no t yet approved or cleared by the [place] FDA and  has been authorized for detection and/or diagnosis of SARS-CoV-2 by FDA under an Emergency Use Authorization (EUA). This EUA will remain  in effect (meaning this test can be used) for the duration of the COVID-19 declaration under Section 564(b)(1) of the Act, 21 U.S.C.section 360bbb-3(b)(1), unless the authorization is terminated  or revoked sooner.       Influenza A by PCR NEGATIVE NEGATIVE Final   Influenza B by PCR NEGATIVE NEGATIVE Final    Comment: (NOTE) The Xpert Xpress SARS-CoV-2/FLU/RSV plus assay is intended as an aid in the  diagnosis of influenza from Nasopharyngeal swab specimens and should not be used as a sole basis for treatment. Nasal washings and aspirates are unacceptable for Xpert Xpress SARS-CoV-2/FLU/RSV testing.  Fact Sheet for Patients: BloggerCourse.comhttps://www.fda.gov/media/152166/download  Fact Sheet for Healthcare Providers: SeriousBroker.ithttps://www.fda.gov/media/152162/download  This test is not yet approved or cleared by the [place] FDA and has been authorized for detection and/or diagnosis of SARS-CoV-2 by FDA under an Emergency Use Authorization (EUA). This EUA will remain in effect (meaning this test can be used) for the duration of the COVID-19 declaration under Section 564(b)(1) of the Act, 21 U.S.C. section 360bbb-3(b)(1), unless the authorization is terminated or revoked.  Performed at [facility], [address] [place], [place] [address]   MRSA PCR Screening     Status: Abnormal   Collection Time: 04/04/20  6:27 AM   Specimen: Nasal Mucosa; Nasopharyngeal  Result Value Ref Range Status   MRSA by PCR POSITIVE (A) NEGATIVE Final    Comment:        The GeneXpert MRSA Assay (FDA approved for NASAL specimens only), is one component of a comprehensive MRSA colonization surveillance program. It is not intended to diagnose MRSA infection nor to guide or monitor treatment for MRSA infections. RESULT CALLED TO, READ BACK BY AND VERIFIED WITH: LUKE LEDWELL 4/1 @ 0908 BY Chauncey MannS. BEARD Performed at [facility], [address] [place], [place] [address]   Culture, blood (routine x 2)     Status: None (Preliminary result)   Collection Time: 04/05/20  1:36 PM   Specimen: BLOOD RIGHT ARM  Result Value Ref Range Status   Specimen Description BLOOD RIGHT ARM  Final   Special Requests   Final  Blood Culture adequate volume BOTTLES DRAWN AEROBIC AND ANAEROBIC   Culture   Final    NO GROWTH 2 DAYS Performed at [facility], [address] [place], [place] [address]    Report Status PENDING  Incomplete   Culture, blood (routine x 2)     Status: None (Preliminary result)   Collection Time: 04/05/20  1:36 PM   Specimen: BLOOD RIGHT HAND  Result Value Ref Range Status   Specimen Description BLOOD RIGHT HAND  Final   Special Requests   Final    Blood Culture adequate volume BOTTLES DRAWN AEROBIC AND ANAEROBIC   Culture   Final    NO GROWTH 2 DAYS Performed at [facility], [address] [place], [place] [address]    Report Status PENDING  Incomplete    Radiology Reports DG Chest Port 1 View  Result Date: 04/04/2020 CLINICAL DATA:  Hypoxia and recent overdose EXAM: PORTABLE CHEST 1 VIEW COMPARISON:  11/20/2019 FINDINGS: Cardiac shadow is within normal limits. The lungs are well aerated bilaterally. Patchy airspace opacities are noted throughout both lungs but primarily within the bases right greater than left. These changes likely represent pulmonary edema following the known overdose. Possibility of aspiration would deserve consideration as well. No bony abnormality is noted. IMPRESSION: Vascular congestion and diffuse airspace opacities as described. Electronically Signed   By: Alcide Clever M.D.   On: 04/04/2020 03:14    SIGNED: Kendell Bane, MD, FHM. [facility],  Pager (please use [email or web address] to page/text) Please use Epic Secure Chat for non-urgent communication (7AM-7PM)  If 7PM-7AM, please contact night-coverage [email or web address], 04/07/2020, 12:25 PM

## 2020-04-07 NOTE — Plan of Care (Signed)
Alert and oriented x 4. Denies pain or discomfort at this time. IV abt infused and tolerated well. Daughter and son called to check on pt but she did not want them to be given any information, she said she will talk to them in the morning. Foley patent with clear yellow urine.  Problem: Education: Goal: Knowledge of General Education information will improve Description: Including pain rating scale, medication(s)/side effects and non-pharmacologic comfort measures Outcome: Progressing   Problem: Health Behavior/Discharge Planning: Goal: Ability to manage health-related needs will improve Outcome: Progressing   Problem: Clinical Measurements: Goal: Ability to maintain clinical measurements within normal limits will improve Outcome: Progressing Goal: Will remain free from infection Outcome: Progressing Goal: Diagnostic test results will improve Outcome: Progressing Goal: Respiratory complications will improve Outcome: Progressing Goal: Cardiovascular complication will be avoided Outcome: Progressing   Problem: Activity: Goal: Risk for activity intolerance will decrease Outcome: Progressing   Problem: Nutrition: Goal: Adequate nutrition will be maintained Outcome: Progressing   Problem: Coping: Goal: Level of anxiety will decrease Outcome: Progressing   Problem: Elimination: Goal: Will not experience complications related to bowel motility Outcome: Progressing Goal: Will not experience complications related to urinary retention Outcome: Progressing   Problem: Pain Managment: Goal: General experience of comfort will improve Outcome: Progressing   Problem: Safety: Goal: Ability to remain free from injury will improve Outcome: Progressing   Problem: Skin Integrity: Goal: Risk for impaired skin integrity will decrease Outcome: Progressing   Problem: Education: Goal: Knowledge of disease or condition will improve Outcome: Progressing   Problem: Health  Behavior/Discharge Planning: Goal: Ability to identify changes in lifestyle to reduce recurrence of condition will improve Outcome: Progressing Goal: Identification of resources available to assist in meeting health care needs will improve Outcome: Progressing   Problem: Physical Regulation: Goal: Complications related to the disease process, condition or treatment will be avoided or minimized Outcome: Progressing   Problem: Safety: Goal: Ability to remain free from injury will improve Outcome: Progressing

## 2020-04-08 DIAGNOSIS — R404 Transient alteration of awareness: Secondary | ICD-10-CM | POA: Diagnosis not present

## 2020-04-08 DIAGNOSIS — F419 Anxiety disorder, unspecified: Secondary | ICD-10-CM | POA: Diagnosis not present

## 2020-04-08 DIAGNOSIS — L98499 Non-pressure chronic ulcer of skin of other sites with unspecified severity: Secondary | ICD-10-CM | POA: Diagnosis not present

## 2020-04-08 DIAGNOSIS — F319 Bipolar disorder, unspecified: Secondary | ICD-10-CM | POA: Diagnosis not present

## 2020-04-08 LAB — CBC
HCT: 35.8 % — ABNORMAL LOW (ref 36.0–46.0)
Hemoglobin: 10.8 g/dL — ABNORMAL LOW (ref 12.0–15.0)
MCH: 26.7 pg (ref 26.0–34.0)
MCHC: 30.2 g/dL (ref 30.0–36.0)
MCV: 88.4 fL (ref 80.0–100.0)
Platelets: 483 10*3/uL — ABNORMAL HIGH (ref 150–400)
RBC: 4.05 MIL/uL (ref 3.87–5.11)
RDW: 15.4 % (ref 11.5–15.5)
WBC: 7.2 10*3/uL (ref 4.0–10.5)
nRBC: 0 % (ref 0.0–0.2)

## 2020-04-08 MED ORDER — BUPROPION HCL ER (SR) 150 MG PO TB12: 150.0000 mg | ORAL_TABLET | Freq: Every day | ORAL | 3 refills | Status: AC

## 2020-04-08 MED ORDER — ESCITALOPRAM OXALATE 10 MG PO TABS: 5.0000 mg | ORAL_TABLET | Freq: Every day | ORAL | 0 refills | Status: AC

## 2020-04-08 MED ORDER — MUPIROCIN CALCIUM 2 % EX CREA: TOPICAL_CREAM | Freq: Three times a day (TID) | CUTANEOUS | 0 refills | Status: AC

## 2020-04-08 MED ORDER — AMOXICILLIN-POT CLAVULANATE 875-125 MG PO TABS: 1.0000 | ORAL_TABLET | Freq: Two times a day (BID) | ORAL | 0 refills | Status: AC

## 2020-04-08 MED ORDER — NICOTINE 14 MG/24HR TD PT24: 14.0000 mg | MEDICATED_PATCH | Freq: Every day | TRANSDERMAL | 2 refills | Status: AC

## 2020-04-08 NOTE — Progress Notes (Signed)
IV removed and discharge instructions reviewed.  Scripts sent to pharmacy with exception of Wellbutrin which was hard script given.  Family present and to drive home. Patient declined WC and walked to elevator.

## 2020-04-08 NOTE — Discharge Summary (Signed)
Physician Discharge Summary [facility]    Patient: [name]                   Admit date: 04/04/2020   DOB: 28-Mar-1980             Discharge date:04/08/2020/10:52 AM [id number]                          PCP: Merlyn Albert, MD (Inactive)  Disposition: HOME  Recommendations for Outpatient Follow-up:   . Follow up: in 1 week with PCP, resources provided by social worker to follow-up for outpatient rehab  Discharge Condition: Stable   Code Status:   Code Status: Full Code  Diet recommendation: Regular healthy diet   Discharge Diagnoses:    Active Problems:   Bipolar disorder, unspecified (HCC)   Anxiety and depression   Skin ulcer (HCC)   Bipolar 1 disorder (HCC)   Drug overdose   Hypotensive episode   Altered mental status   History of Present Illness/ Hospital Course [name] Summary:    [name] a39 y.o.female,with history of IV drug abuse, nephrolithiasis, depression, anxiety, and more presents the ED with a chief complaint of drug overdose. Per chart review patient was found in the bathroom of her home unresponsive with IV drug paraphernalia surrounding her.  She was given Narcan by EMS and was transported to the hospital. She remains somnolent, is experiencing nausea vomiting. Patient opens her eyes to sternal rub, withdraws from pain, mumbles incoherently. At this time she is on nonrebreather to maintain her oxygen sats.  Chest x-ray showed aspiration pneumonia. She did slight leukocytosis at 11.2 likely acute phase reactant.  UDS and UA still pending. Unfortunately no further history can be obtained.    Acute respiratory failure-aspiration Pneumonia - 1. Much improved patient has been successfully weaned off supplemental oxygen 2. Currently satting 93% on room air 3. Improved mental status 4. WBC elevated now 15.6, 5. Blood pressure is improved 6. 2/2 emesis during overdose 7. Xray shows aspiration 8. Rhonchi BL on  lung exam 9. Zosyn was switched to Unasyn by pharmacy>>> since cultures negative, afebrile, improved leukocytosis antibiotics will be switched to p.o. Augmentin today 04/08/2018. 10. Blood cultures >>> remain negative to date  Acute metabolic encephalopathy 1. Improved mental status, back to baseline 2. Due to possible drug overdose, versus infection 3. Continue respiratory support, blood pressure support 4. 2/2 Heroin overdose 5. UDS: Positive for cocaine, amphetamine, benzodiazepines, marijuana, 6. Continue to monitor  7. Narcan PRN 8. Slowly waking up  Hypotensive -hypertensive            -resolved, blood pressure stabilized               -For past 3 days in ICU setting patient remained hypotensive, requiring multiple liters of IV fluid           lactated Ringer's, midodrine, which all has been DC'd now     Drug overdose 1. Multiple recreation drugs, but still unclear, urine drug screen positive for cocaine, amphetamine, benzodiazepine, marijuana 2. Patient states that she thought she took Tylenol but she is not sure 3. Discussed with social worker regarding providing assist for rehab as an outpatient.  Abscesses  2 abscesses, one is located on the left neck, the other is located in the middle of her abdomen 1. MRSA swab, for possible de-escalation of antibiotics 2. Vancomycin and Zosyn initiated upon admission.. Switched to IV Unasyn >>>  switch to p.o. Augmentin 3. Topical bacitracin    History of depression -Home medication of Wellbutrin, Atarax, Lexapro, trazodone..  -We will discontinue trazodone, switch Atarax to as needed, -Home medication with a lower dose  Tobacco abuse             Patient counseled regarding smoking cessation             NicoDerm patch offered   --------------------------------------------------------------------------------------------------------------------------------------- Cultures; Blood Cultures x 2 >>  NGT   Antimicrobials: IV vancomycin/Zosyn 04/04/2020. IV Unasyn  >>> discontinue 04/07/2020 -initiating p.o. Augmentin>>     Dispo: The patient is from: Home  Anticipated d/c is to: Home         Nutritional status:  Nutrition Problem: Inadequate oral intake Etiology: acute illness,lethargy/confusion Signs/Symptoms: meal completion < 50%,per patient/family report Interventions: Ensure Enlive (each supplement provides 350kcal and 20 grams of protein),MVI  The patient's BMI is: Body mass index is 31.24 kg/m.      Discharge Instructions:   Discharge Instructions    Activity as tolerated - No restrictions   Complete by: As directed    Diet - low sodium heart healthy   Complete by: As directed    Discharge instructions   Complete by: As directed    Social worker has provided you with resources please utilize them as your discretion.  Continue to stay away from all street drugs.... Needed establish with PCP regarding her depression medication Avoid alcohol and tobacco at all cost   Discharge wound care:   Complete by: As directed    Per nursing instructions.... Keep the wound clean, dressing   Increase activity slowly   Complete by: As directed        Medication List    STOP taking these medications   hydrOXYzine 25 MG tablet Commonly known as: ATARAX/VISTARIL   traZODone 50 MG tablet Commonly known as: DESYREL     TAKE these medications   amoxicillin-clavulanate 875-125 MG tablet Commonly known as: AUGMENTIN Take 1 tablet by mouth every 12 (twelve) hours for 5 days.   buPROPion 150 MG 12 hr tablet Commonly known as: WELLBUTRIN SR Take 1 tablet (150 mg total) by mouth daily. For depression   escitalopram 10 MG tablet Commonly known as: LEXAPRO Take 0.5 tablets (5 mg total) by mouth daily. Start taking on: April 09, 2020 What changed:   how much to take  additional instructions   mupirocin cream 2 % Commonly known as: BACTROBAN Apply  topically 3 (three) times daily.   nicotine 14 mg/24hr patch Commonly known as: NICODERM CQ - dosed in mg/24 hours Place 1 patch (14 mg total) onto the skin daily for 28 days. (May buy from over the counter): For smoking cessation       Allergies  Allergen Reactions  . Motrin [Ibuprofen]     Hives emesis  . Hydrocodone Hives  . Penicillins Nausea And Vomiting  . Ultram [Tramadol] Nausea And Vomiting  . Aspirin Nausea And Vomiting and Rash     Procedures /Studies:   DG Chest Port 1 View  Result Date: 04/04/2020 CLINICAL DATA:  Hypoxia and recent overdose EXAM: PORTABLE CHEST 1 VIEW COMPARISON:  11/20/2019 FINDINGS: Cardiac shadow is within normal limits. The lungs are well aerated bilaterally. Patchy airspace opacities are noted throughout both lungs but primarily within the bases right greater than left. These changes likely represent pulmonary edema following the known overdose. Possibility of aspiration would deserve consideration as well. No bony abnormality is noted. IMPRESSION: Vascular congestion  and diffuse airspace opacities as described. Electronically Signed   By: Alcide Clever M.D.   On: 04/04/2020 03:14     Subjective:   Patient was seen and examined 04/08/2020, 10:52 AM Patient stable today. No acute distress.  No issues overnight Stable for discharge.  Discharge Exam:    Vitals:   04/07/20 1413 04/07/20 2112 04/08/20 0519 04/08/20 0814  BP: (!) 150/87 104/81 (!) 147/86 (!) 136/94  Pulse: 80 75 61 77  Resp: 18 18 16 18   Temp: 98.7 F (37.1 C) 98.6 F (37 C) 97.6 F (36.4 C) 98.4 F (36.9 C)  TempSrc: Oral Oral  Oral  SpO2: 94% 95% 96% 93%  Weight:   75 kg   Height:        General: Pt lying comfortably in bed & appears in no obvious distress. Cardiovascular: S1 & S2 heard, RRR, S1/S2 +. No murmurs, rubs, gallops or clicks. No JVD or pedal edema. Respiratory: Clear to auscultation without wheezing, rhonchi or crackles. No increased work of  breathing. Abdominal:  Non-distended, non-tender & soft. No organomegaly or masses appreciated. Normal bowel sounds heard. CNS: Alert and oriented. No focal deficits. Extremities: no edema, no cyanosis      The results of significant diagnostics from this hospitalization (including imaging, microbiology, ancillary and laboratory) are listed below for reference.      Microbiology:   Recent Results (from the past 240 hour(s))  Resp Panel by RT-PCR (Flu A&B, Covid) Nasopharyngeal Swab     Status: None   Collection Time: 04/04/20  4:16 AM   Specimen: Nasopharyngeal Swab; Nasopharyngeal(NP) swabs in vial transport medium  Result Value Ref Range Status   SARS Coronavirus 2 by RT PCR NEGATIVE NEGATIVE Final    Comment: (NOTE) SARS-CoV-2 target nucleic acids are NOT DETECTED.  The SARS-CoV-2 RNA is generally detectable in upper respiratory specimens during the acute phase of infection. The lowest concentration of SARS-CoV-2 viral copies this assay can detect is 138 copies/mL. A negative result does not preclude SARS-Cov-2 infection and should not be used as the sole basis for treatment or other patient management decisions. A negative result may occur with  improper specimen collection/handling, submission of specimen other than nasopharyngeal swab, presence of viral mutation(s) within the areas targeted by this assay, and inadequate number of viral copies(<138 copies/mL). A negative result must be combined with clinical observations, patient history, and epidemiological information. The expected result is Negative.  Fact Sheet for Patients:  06/04/20  Fact Sheet for Healthcare Providers:  BloggerCourse.com  This test is no t yet approved or cleared by the [employer] FDA and  has been authorized for detection and/or diagnosis of SARS-CoV-2 by FDA under an Emergency Use Authorization (EUA). This EUA will remain  in effect  (meaning this test can be used) for the duration of the COVID-19 declaration under Section 564(b)(1) of the Act, 21 U.S.C.section 360bbb-3(b)(1), unless the authorization is terminated  or revoked sooner.       Influenza A by PCR NEGATIVE NEGATIVE Final   Influenza B by PCR NEGATIVE NEGATIVE Final    Comment: (NOTE) The Xpert Xpress SARS-CoV-2/FLU/RSV plus assay is intended as an aid in the diagnosis of influenza from Nasopharyngeal swab specimens and should not be used as a sole basis for treatment. Nasal washings and aspirates are unacceptable for Xpert Xpress SARS-CoV-2/FLU/RSV testing.  Fact Sheet for Patients: [place]  Fact Sheet for Healthcare Providers: BloggerCourse.com  This test is not yet approved or cleared by the [employer]  FDA and has been authorized for detection and/or diagnosis of SARS-CoV-2 by FDA under an Emergency Use Authorization (EUA). This EUA will remain in effect (meaning this test can be used) for the duration of the COVID-19 declaration under Section 564(b)(1) of the Act, 21 U.S.C. section 360bbb-3(b)(1), unless the authorization is terminated or revoked.  Performed at [facility], [address] [place], [place] [address]   MRSA PCR Screening     Status: Abnormal   Collection Time: 04/04/20  6:27 AM   Specimen: Nasal Mucosa; Nasopharyngeal  Result Value Ref Range Status   MRSA by PCR POSITIVE (A) NEGATIVE Final    Comment:        The GeneXpert MRSA Assay (FDA approved for NASAL specimens only), is one component of a comprehensive MRSA colonization surveillance program. It is not intended to diagnose MRSA infection nor to guide or monitor treatment for MRSA infections. RESULT CALLED TO, READ BACK BY AND VERIFIED WITH: LUKE LEDWELL 4/1 @ 0908 BY S. BEARD Performed at [facility], [address] [place], [place] [address]   Culture, blood (routine x 2)     Status: None  (Preliminary result)   Collection Time: 04/05/20  1:36 PM   Specimen: BLOOD RIGHT ARM  Result Value Ref Range Status   Specimen Description BLOOD RIGHT ARM  Final   Special Requests   Final    Blood Culture adequate volume BOTTLES DRAWN AEROBIC AND ANAEROBIC   Culture   Final    NO GROWTH 3 DAYS Performed at [facility], [address] [place], [place] [address]    Report Status PENDING  Incomplete  Culture, blood (routine x 2)     Status: None (Preliminary result)   Collection Time: 04/05/20  1:36 PM   Specimen: BLOOD RIGHT HAND  Result Value Ref Range Status   Specimen Description BLOOD RIGHT HAND  Final   Special Requests   Final    Blood Culture adequate volume BOTTLES DRAWN AEROBIC AND ANAEROBIC   Culture   Final    NO GROWTH 3 DAYS Performed at [facility], [address] [place], [place] [address]    Report Status PENDING  Incomplete     Labs:   CBC: Recent Labs  Lab 04/04/20 0307 04/04/20 0641 04/05/20 0522 04/06/20 0523 04/07/20 0623 04/08/20 0456  WBC 11.2* 7.2 15.6* 12.6* 8.4 7.2  NEUTROABS 9.3*  --   --   --   --   --   HGB 12.2 14.5 9.7* 9.7* 10.4* 10.8*  HCT 40.5 49.8* 32.1* 32.3* 34.1* 35.8*  MCV 90.4 92.2 90.9 90.5 87.0 88.4  PLT 451* 391 322 351 451* 483*   Basic Metabolic Panel: Recent Labs  Lab 04/04/20 0307 04/04/20 0641 04/05/20 0546 04/06/20 0523 04/07/20 0623  NA 139 139 136 136 139  K 3.7 4.4 4.0 3.7 3.7  CL 109 107 104 102 104  CO2 24 25 25 25 24   GLUCOSE 121* 83 82 79 85  BUN 15 14 10 7 8   CREATININE 0.84 0.91 0.61 0.59 0.62  CALCIUM 8.0* 8.3* 7.9* 7.8* 8.2*  MG  --  1.7  --   --   --   PHOS  --  4.3  --   --   --    Liver Function Tests: Recent Labs  Lab 04/04/20 0307 04/04/20 0641 04/05/20 0546 04/06/20 0523 04/07/20 0623  AST 36 34 29 31 42*  ALT 68* 66* 42 44 50*  ALKPHOS 58 56 38 38 48  BILITOT 0.1* 0.6 0.6 0.7 0.7  PROT 6.6 6.4* 5.0* 5.5* 6.3*  ALBUMIN 2.9* 2.9* 2.1* 2.3* 2.5*   BNP (last 3  results) Recent Labs    04/04/20 0401  BNP 24.0   Cardiac Enzymes: No results for input(s): CKTOTAL, CKMB, CKMBINDEX, TROPONINI in the last 168 hours. CBG: No results for input(s): GLUCAP in the last 168 hours. Hgb A1c No results for input(s): HGBA1C in the last 72 hours. Lipid Profile No results for input(s): CHOL, HDL, LDLCALC, TRIG, CHOLHDL, LDLDIRECT in the last 72 hours. Thyroid function studies No results for input(s): TSH, T4TOTAL, T3FREE, THYROIDAB in the last 72 hours.  Invalid input(s): FREET3 Anemia work up No results for input(s): VITAMINB12, FOLATE, FERRITIN, TIBC, IRON, RETICCTPCT in the last 72 hours. Urinalysis    Component Value Date/Time   COLORURINE YELLOW 04/04/2020 0908   APPEARANCEUR CLEAR 04/04/2020 0908   LABSPEC 1.024 04/04/2020 0908   PHURINE 5.0 04/04/2020 0908   GLUCOSEU NEGATIVE 04/04/2020 0908   HGBUR SMALL (A) 04/04/2020 0908   BILIRUBINUR NEGATIVE 04/04/2020 0908   KETONESUR NEGATIVE 04/04/2020 0908   PROTEINUR NEGATIVE 04/04/2020 0908   UROBILINOGEN 1.0 04/03/2014 1830   NITRITE NEGATIVE 04/04/2020 0908   LEUKOCYTESUR NEGATIVE 04/04/2020 0908         Time coordinating discharge: Over 45 minutes  SIGNED: Kendell Bane, MD, FACP, FHM. [facility],  Please use [email or web address] to Page If 7PM-7AM, please contact night-coverage [email or web address] 04/08/2020, 10:52 AM

## 2020-04-11 LAB — CULTURE, BLOOD (ROUTINE X 2)
Culture: NO GROWTH
Culture: NO GROWTH
Special Requests: ADEQUATE
Special Requests: ADEQUATE

## 2020-05-31 DIAGNOSIS — M545 Low back pain, unspecified: Secondary | ICD-10-CM | POA: Diagnosis not present

## 2020-05-31 DIAGNOSIS — M549 Dorsalgia, unspecified: Secondary | ICD-10-CM | POA: Diagnosis not present

## 2020-05-31 DIAGNOSIS — R52 Pain, unspecified: Secondary | ICD-10-CM | POA: Diagnosis not present

## 2020-05-31 DIAGNOSIS — N3000 Acute cystitis without hematuria: Secondary | ICD-10-CM | POA: Diagnosis not present

## 2020-05-31 DIAGNOSIS — B9689 Other specified bacterial agents as the cause of diseases classified elsewhere: Secondary | ICD-10-CM | POA: Diagnosis not present

## 2020-08-16 ENCOUNTER — Encounter (HOSPITAL_COMMUNITY): Payer: Self-pay | Admitting: Emergency Medicine

## 2020-08-16 ENCOUNTER — Emergency Department (HOSPITAL_COMMUNITY)
Admission: EM | Admit: 2020-08-16 | Discharge: 2020-08-16 | Disposition: A | Discharge status: Home / Self Care | Payer: Medicaid Other | Attending: Emergency Medicine | Admitting: Emergency Medicine

## 2020-08-16 ENCOUNTER — Emergency Department (HOSPITAL_COMMUNITY): Payer: Medicaid Other

## 2020-08-16 ENCOUNTER — Other Ambulatory Visit: Payer: Self-pay

## 2020-08-16 DIAGNOSIS — J384 Edema of larynx: Secondary | ICD-10-CM | POA: Diagnosis not present

## 2020-08-16 DIAGNOSIS — J029 Acute pharyngitis, unspecified: Secondary | ICD-10-CM | POA: Diagnosis present

## 2020-08-16 DIAGNOSIS — F17210 Nicotine dependence, cigarettes, uncomplicated: Secondary | ICD-10-CM | POA: Insufficient documentation

## 2020-08-16 DIAGNOSIS — J36 Peritonsillar abscess: Secondary | ICD-10-CM | POA: Diagnosis not present

## 2020-08-16 DIAGNOSIS — J351 Hypertrophy of tonsils: Secondary | ICD-10-CM | POA: Diagnosis not present

## 2020-08-16 DIAGNOSIS — I959 Hypotension, unspecified: Secondary | ICD-10-CM | POA: Diagnosis not present

## 2020-08-16 DIAGNOSIS — I672 Cerebral atherosclerosis: Secondary | ICD-10-CM | POA: Diagnosis not present

## 2020-08-16 DIAGNOSIS — R52 Pain, unspecified: Secondary | ICD-10-CM | POA: Diagnosis not present

## 2020-08-16 MED ORDER — CLINDAMYCIN PHOSPHATE 900 MG/50ML IV SOLN
900.0000 mg | Freq: Once | INTRAVENOUS | Status: AC
  Administered 2020-08-16: 900 mg via INTRAVENOUS
  Filled 2020-08-16: qty 50

## 2020-08-16 MED ORDER — LIDOCAINE VISCOUS HCL 2 % MT SOLN: 15.0000 mL | OROMUCOSAL | 0 refills | Status: AC | PRN

## 2020-08-16 MED ORDER — CLINDAMYCIN HCL 150 MG PO CAPS: 300.0000 mg | ORAL_CAPSULE | Freq: Four times a day (QID) | ORAL | 0 refills | Status: AC

## 2020-08-16 MED ORDER — SODIUM CHLORIDE 0.9 % IV SOLN: INTRAVENOUS | Status: DC

## 2020-08-16 MED ORDER — DEXAMETHASONE SODIUM PHOSPHATE 10 MG/ML IJ SOLN
10.0000 mg | Freq: Once | INTRAMUSCULAR | Status: AC
  Administered 2020-08-16: 10 mg via INTRAVENOUS
  Filled 2020-08-16: qty 1

## 2020-08-16 MED ORDER — IOHEXOL 300 MG/ML  SOLN
75.0000 mL | Freq: Once | INTRAMUSCULAR | Status: AC | PRN
  Administered 2020-08-16: 75 mL via INTRAVENOUS

## 2020-08-16 NOTE — ED Notes (Signed)
Patient transported to CT 

## 2020-08-16 NOTE — Discharge Instructions (Addendum)
Your testing shows that you have an abscess around your tonsil on the right, the treatment for this is a medicine called clindamycin, you will need to take this 4 times a day for the next 10 days  Please see the phone number for the ear nose and throat doctor above, you will need to follow-up with them early this week.  Call on Monday morning for appointment  You are to return to the emergency department immediately for any severe or worsening symptoms  Lidocaine gargle and swallow to help with the sore throat

## 2020-08-16 NOTE — ED Triage Notes (Signed)
Pt c/o sore throat and earache x2 days. Pt states pain is getting worse, especially at night.

## 2020-08-16 NOTE — ED Provider Notes (Signed)
[facility] EMERGENCY DEPARTMENT Provider Note   CSN: [id number] Arrival date & time: 08/16/20  2703     History No chief complaint on file.   [name] is a 40 y.o. female.  HPI  This patient is a 40 year old female, she has a prior history of several days of a sore throat and right-sided ear pain, today was much worse causing her to present to the emergency department.  She has no coughing, no abdominal pain, no nausea or vomiting.  Symptoms are persistent gradually worsening and became severe.  No medications prior to arrival other than some over-the-counter medications for pain, she has some associated right-sided ear pain  Past Medical History:  Diagnosis Date   Anxiety    takes Xanax daily as needed   Chronic back pain    herniated disc   Chronic pelvic pain in female    Depression    takes Lexapro daily   Drug-seeking behavior    Headache    constant   History of bronchitis    yrs ago   History of kidney stones    Insomnia    takes Elavil nightly   IV drug abuse (HCC)    Nocturia    Seasonal allergies    Claritin and FLonase daily as needed    Patient Active Problem List   Diagnosis Date Noted   Altered mental status 04/06/2020   Drug overdose 04/04/2020   Hypotensive episode 04/04/2020   Amphetamine use disorder, severe, dependence (HCC) 03/30/2019   Bipolar 1 disorder (HCC) 03/29/2019   Suicide attempt (HCC) 03/29/2019   Skin ulcer (HCC) 08/03/2016   Cellulitis 08/03/2016   Cellulitis of hand, left    Hepatitis C antibody test positive 01/09/2015   GERD (gastroesophageal reflux disease) 01/09/2015   Abnormal liver enzymes 12/10/2014   Acute hepatitis C virus infection without hepatic coma 12/10/2014   Insomnia 09/12/2014   Thoracic spondylosis 04/03/2014   Herniated nucleus pulposus, thoracic 03/28/2014   Anxiety and depression 11/29/2013   Bipolar disorder, unspecified (HCC) 08/14/2013   Panic attacks 08/14/2013   Chronic back pain  08/14/2013    Past Surgical History:  Procedure Laterality Date   CHOLECYSTECTOMY     I & D EXTREMITY Left 08/03/2016   Procedure: IRRIGATION AND DEBRIDEMENT LEFT HAND;  Surgeon: Dominica Severin, MD;  Location: [facility];  Service: Orthopedics;  Laterality: Left;   THORACIC DISCECTOMY Right 03/28/2014   Procedure: RIGHT THORACIC ELEVEN-TWELVE MICRODICSECTOMY;  Surgeon: Tressie Stalker, MD;  Location: [facility];  Service: Neurosurgery;  Laterality: Right;  right   TUBAL LIGATION       OB History     Gravida  3   Para  3   Term  3   Preterm      AB      Living  3      SAB      IAB      Ectopic      Multiple      Live Births              Family History  Problem Relation Age of Onset   Hypertension Mother    Hypertension Father    Hypertension Sister    Stroke Other    Cancer Other    Colon cancer Neg Hx     Social History   Tobacco Use   Smoking status: Every Day    Packs/day: 0.50    Years: 0.00    Pack years: 0.00  Types: Cigarettes   Smokeless tobacco: Never  Vaping Use   Vaping Use: Never used  Substance Use Topics   Alcohol use: Yes    Comment: occ   Drug use: Yes    Types: Marijuana, Methamphetamines    Home Medications Prior to Admission medications   Medication Sig Start Date End Date Taking? Authorizing Provider  clindamycin (CLEOCIN) 150 MG capsule Take 2 capsules (300 mg total) by mouth 4 (four) times daily for 10 days. May dispense as 150mg  capsules 08/16/20 08/26/20 Yes 08/28/20, MD  buPROPion [facility] SR) 150 MG 12 hr tablet Take 1 tablet (150 mg total) by mouth daily. For depression 04/08/20 05/08/20  Shahmehdi, 07/08/20, MD  escitalopram (LEXAPRO) 10 MG tablet Take 0.5 tablets (5 mg total) by mouth daily. 04/09/20 05/09/20  Shahmehdi7/6/22, MD  mupirocin cream (BACTROBAN) 2 % Apply topically 3 (three) times daily. 04/08/20   06/08/20, MD    Allergies    Motrin [ibuprofen], Hydrocodone, Penicillins, Ultram  [tramadol], and Aspirin  Review of Systems   Review of Systems  All other systems reviewed and are negative.  Physical Exam Updated Vital Signs BP 101/72 (BP Location: Right Arm)   Pulse 86   Temp 98.2 F (36.8 C) (Oral)   Resp 18   Ht 1.549 m (5\' 1" )   Wt 72.6 kg   SpO2 100%   BMI 30.23 kg/m   Physical Exam Vitals and nursing note reviewed.  Constitutional:      General: She is not in acute distress.    Appearance: She is well-developed.  HENT:     Head: Normocephalic and atraumatic.     Mouth/Throat:     Pharynx: No oropharyngeal exudate.     Comments: Peritonsillar abscess on the right, mild trismus Eyes:     General: No scleral icterus.       Right eye: No discharge.        Left eye: No discharge.     Conjunctiva/sclera: Conjunctivae normal.     Pupils: Pupils are equal, round, and reactive to light.  Neck:     Thyroid: No thyromegaly.     Vascular: No JVD.  Cardiovascular:     Rate and Rhythm: Normal rate and regular rhythm.     Heart sounds: Normal heart sounds. No murmur heard.   No friction rub. No gallop.  Pulmonary:     Effort: Pulmonary effort is normal. No respiratory distress.     Breath sounds: Normal breath sounds. No wheezing or rales.  Abdominal:     General: Bowel sounds are normal. There is no distension.     Palpations: Abdomen is soft. There is no mass.     Tenderness: There is no abdominal tenderness.  Musculoskeletal:        General: No tenderness. Normal range of motion.     Cervical back: Normal range of motion and neck supple.  Lymphadenopathy:     Cervical: No cervical adenopathy.  Skin:    General: Skin is warm and dry.     Findings: No erythema or rash.  Neurological:     Mental Status: She is alert.     Coordination: Coordination normal.  Psychiatric:        Behavior: Behavior normal.    ED Results / Procedures / Treatments   Labs (all labs ordered are listed, but only abnormal results are displayed) Labs Reviewed   GROUP A STREP BY PCR    EKG None  Radiology CT  Soft Tissue Neck W Contrast  Result Date: 08/16/2020 CLINICAL DATA:  Initial evaluation for right peritonsillar abscess. EXAM: CT NECK WITH CONTRAST TECHNIQUE: Multidetector CT imaging of the neck was performed using the standard protocol following the bolus administration of intravenous contrast. CONTRAST:  66mL OMNIPAQUE IOHEXOL 300 MG/ML  SOLN COMPARISON:  CT from 04/13/2018. FINDINGS: Pharynx and larynx: Oral cavity within normal limits. Poor dentition with innumerable dental caries and periapical lucencies seen about the teeth. No visible associated inflammatory changes. Right palatine tonsil enlarged and hypertrophied, consistent with acute tonsillitis. Superimposed hypodense rim enhancing collection measuring 2.7 x 1.0 x 2.7 cm consistent with tonsillar/peritonsillar abscess (series 2, image 30). Associated mucosal edema and swelling within the adjacent pharyngeal mucosa, consistent with concomitant pharyngitis. Inflammatory stranding within the adjacent right parapharyngeal fat. Associated small layering retropharyngeal effusion. Small volume layering secretions noted within the hypopharynx. Epiglottis itself is within normal limits. Vallecula clear. Glottis normal. Subglottic airway clear. Salivary glands: Salivary glands including the parotid and submandibular glands are within normal limits. Thyroid: Normal. Lymph nodes: Asymmetric prominence of right level II lymph nodes, largest of which measures at the upper limits of normal at 1 cm, likely reactive. There are a few additional prominent/enlarged nodes at lower right level V/supraclavicular region, largest of which measures 1.4 cm (series 2, image 58), indeterminate. Prominent nodes seen at this location on prior neck CT from 2020 as well. Vascular: Normal intravascular enhancement seen throughout the neck. Mild to moderate atheromatous change about the aortic arch, carotid bifurcations, and  carotid siphons. Limited intracranial: Unremarkable. Visualized orbits: Unremarkable. Mastoids and visualized paranasal sinuses: Paranasal sinuses are clear. Visualized mastoids and middle ear cavities are well pneumatized and free of fluid. Skeleton: No acute osseous finding. No discrete or worrisome osseous lesions. Upper chest: Centrilobular emphysematous changes seen within the visualized lungs. Visualized upper chest demonstrates no other acute finding. Other: None. IMPRESSION: 1. Findings consistent with acute right-sided tonsillitis/pharyngitis, with superimposed 2.7 x 1.0 x 2.7 cm right tonsillar/peritonsillar abscess. 2. Enlarged right level II lymph nodes, likely reactive. 3. Additional enlarged lower right level V/supraclavicular nodes as above. While these findings may be reactive as well, reactive adenopathy at this location related to tonsillitis/pharyngitis would be somewhat atypical. Additionally, enlarged nodes were also seen at this level on prior neck CT from 2020. Clinical follow-up to resolution recommended. If these changes persist, correlation with dedicated histologic sampling could be considered as warranted. 4. Poor dentition without acute inflammatory changes. 5. Emphysema (ICD10-J43.9). Electronically Signed   By: Rise Mu M.D.   On: 08/16/2020 20:36    Procedures Procedures   Medications Ordered in ED Medications  0.9 %  sodium chloride infusion ( Intravenous New Bag/Given 08/16/20 2026)  clindamycin (CLEOCIN) IVPB 900 mg (900 mg Intravenous New Bag/Given 08/16/20 2029)  dexamethasone (DECADRON) injection 10 mg (10 mg Intravenous Given 08/16/20 2022)  iohexol (OMNIPAQUE) 300 MG/ML solution 75 mL (75 mLs Intravenous Contrast Given 08/16/20 2002)    ED Course  I have reviewed the triage vital signs and the nursing notes.  Pertinent labs & imaging results that were available during my care of the patient were reviewed by me and considered in my medical decision  making (see chart for details).    MDM Rules/Calculators/A&P                            The patient has a clinical peritonsillar abscess, I did a CT scan of her neck which  shows that she does not fact have a peritonsillar abscess.  She is able to tolerate food and fluids but can only open her mouth a's moderate amount.  She is agreeable to take antibiotics and return, I discussed the case with Dr. Pollyann Kennedyosen who suggest a nonoperative management at this point to let her have antibiotics and follow-up in the office.  I think this is reasonable and the patient is agreeable to the plan.  I detailed a plan for the patient of what reasons to come back for and she is agreeable.  Stable for discharge at this time   Final Clinical Impression(s) / ED Diagnoses Final diagnoses:  Peritonsillar abscess    Rx / DC Orders ED Discharge Orders          Ordered    clindamycin (CLEOCIN) 150 MG capsule  4 times daily        08/16/20 2126             Eber HongMiller, Zalaya Astarita, MD 08/16/20 2142

## 2020-12-15 DIAGNOSIS — F439 Reaction to severe stress, unspecified: Secondary | ICD-10-CM | POA: Diagnosis not present
